# Patient Record
Sex: Male | Born: 1945 | Race: White | Hispanic: No | Marital: Married | Smoking: Never smoker
Health system: Southern US, Community
[De-identification: ages and names within clinical notes are randomized; demographics above are authoritative.]

## PROBLEM LIST (undated history)

## (undated) DIAGNOSIS — J189 Pneumonia, unspecified organism: Secondary | ICD-10-CM

## (undated) DIAGNOSIS — M1990 Unspecified osteoarthritis, unspecified site: Secondary | ICD-10-CM

## (undated) DIAGNOSIS — J84112 Idiopathic pulmonary fibrosis: Secondary | ICD-10-CM

## (undated) DIAGNOSIS — Z9289 Personal history of other medical treatment: Secondary | ICD-10-CM

## (undated) DIAGNOSIS — J42 Unspecified chronic bronchitis: Secondary | ICD-10-CM

## (undated) DIAGNOSIS — I2510 Atherosclerotic heart disease of native coronary artery without angina pectoris: Secondary | ICD-10-CM

## (undated) DIAGNOSIS — G4733 Obstructive sleep apnea (adult) (pediatric): Secondary | ICD-10-CM

## (undated) DIAGNOSIS — E7800 Pure hypercholesterolemia, unspecified: Secondary | ICD-10-CM

## (undated) DIAGNOSIS — E039 Hypothyroidism, unspecified: Secondary | ICD-10-CM

## (undated) DIAGNOSIS — Z9989 Dependence on other enabling machines and devices: Secondary | ICD-10-CM

## (undated) DIAGNOSIS — K254 Chronic or unspecified gastric ulcer with hemorrhage: Secondary | ICD-10-CM

## (undated) DIAGNOSIS — I4510 Unspecified right bundle-branch block: Secondary | ICD-10-CM

## (undated) DIAGNOSIS — I10 Essential (primary) hypertension: Secondary | ICD-10-CM

## (undated) HISTORY — PX: TONSILLECTOMY: SUR1361

---

## 1970-08-03 HISTORY — PX: INGUINAL HERNIA REPAIR: SUR1180

## 1982-08-03 DIAGNOSIS — K254 Chronic or unspecified gastric ulcer with hemorrhage: Secondary | ICD-10-CM

## 1982-08-03 DIAGNOSIS — Z9289 Personal history of other medical treatment: Secondary | ICD-10-CM

## 1982-08-03 HISTORY — DX: Chronic or unspecified gastric ulcer with hemorrhage: K25.4

## 1982-08-03 HISTORY — DX: Personal history of other medical treatment: Z92.89

## 1992-08-03 HISTORY — PX: CATARACT EXTRACTION W/ INTRAOCULAR LENS  IMPLANT, BILATERAL: SHX1307

## 1992-08-03 HISTORY — PX: CYSTOSCOPY W/ STONE MANIPULATION: SHX1427

## 1992-08-03 HISTORY — PX: KNEE ARTHROSCOPY: SHX127

## 2016-01-10 ENCOUNTER — Institutional Professional Consult (permissible substitution): Payer: Self-pay | Admitting: Internal Medicine

## 2016-01-20 ENCOUNTER — Encounter (HOSPITAL_COMMUNITY): Payer: Self-pay

## 2016-01-20 ENCOUNTER — Encounter (HOSPITAL_COMMUNITY)
Admission: RE | Admit: 2016-01-20 | Discharge: 2016-01-20 | Disposition: A | Discharge status: Home / Self Care | Payer: Medicare Other | Source: Ambulatory Visit | Attending: Pulmonary Disease | Admitting: Pulmonary Disease

## 2016-01-20 VITALS — BP 153/65 | HR 75 | Ht 71.0 in | Wt 251.1 lb

## 2016-01-20 DIAGNOSIS — J84112 Idiopathic pulmonary fibrosis: Secondary | ICD-10-CM | POA: Insufficient documentation

## 2016-01-20 HISTORY — DX: Idiopathic pulmonary fibrosis: J84.112

## 2016-01-20 HISTORY — DX: Unspecified right bundle-branch block: I45.10

## 2016-01-20 HISTORY — DX: Essential (primary) hypertension: I10

## 2016-01-20 NOTE — Progress Notes (Signed)
Douglas Lopez 70 y.o. male Pulmonary Rehab Orientation Note Patient arrived today in Cardiac and Pulmonary Rehab for orientation to Pulmonary Rehab. [facility] via wheel chair. He does not carry portable oxygen. Per pt, he never uses oxygen. Color good, skin warm and dry. Patient is oriented to time and place. Patient's medical history, psychosocial health, and medications reviewed. Psychosocial assessment reveals pt lives with their spouse. 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  Pt hobbies include geneology, golf, reading, and travel with his wife. Pt reports his stress level is low. He has no stress in his life.  Pt does not exhibit signs of depression. PHQ2/9 score 0/0. Pt shows good  coping skills with positive outlook . Will continue to monitor and evaluate any change in psychosocial issues, none were identified at this time.  Physical assessment reveals heart rate is normal, breath sounds clear to auscultation, no wheezes, rales, or rhonchi. Grip strength equal, strong. Distal pulses 2+ bilateral posterior tibial present . Patient reports he does take medications as prescribed. Patient states he follows a Regular diet. The patient reports no specific efforts to gain or lose weight.. Patient's weight will be monitored closely. Demonstration and practice of PLB using pulse oximeter. Patient able to return demonstration satisfactorily. Safety and hand hygiene in the exercise area reviewed with patient. Patient voices understanding of the information reviewed. Department expectations discussed with patient and achievable goals were set. The patient shows enthusiasm about attending the program and we look forward to working with this nice gentleman. The patient is scheduled for a 6 min walk test on Thursday, February 06, 2016 @ 4 pm and to begin exercise on Thursday, February 13, 2016 in the 1:30pm.   0930-1300

## 2016-01-23 ENCOUNTER — Encounter (HOSPITAL_COMMUNITY): Payer: Self-pay | Admitting: *Deleted

## 2016-02-06 ENCOUNTER — Encounter (HOSPITAL_COMMUNITY)
Admission: RE | Admit: 2016-02-06 | Discharge: 2016-02-06 | Disposition: A | Discharge status: Home / Self Care | Payer: Medicare Other | Source: Ambulatory Visit | Attending: Pulmonary Disease | Admitting: Pulmonary Disease

## 2016-02-06 DIAGNOSIS — J84112 Idiopathic pulmonary fibrosis: Secondary | ICD-10-CM | POA: Diagnosis not present

## 2016-02-07 NOTE — Progress Notes (Signed)
Pulmonary Individual Treatment Plan  Patient Details  Name: Douglas Lopez MRN: [id number] Date of Birth: Jan 25, 1946 Referring Provider:        [facility]ulmonary Rehab [facility]   Referring Provider  Dr. Molli KnockYacoub      Initial Encounter Date:       Pulmonary Rehab [facility]   Date  02/07/16   Referring Provider  Dr. Molli KnockYacoub      Visit Diagnosis: Idiopathic pulmonary fibrosis (HCC)  Patient's Home Medications on Admission:   Current outpatient prescriptions:  .  amLODipine (NORVASC) 2.5 MG tablet, Take 2.5 mg by mouth daily., Disp: , Rfl:  .  amlodipine-benazepril (LOTREL) 2.5-10 MG capsule, Take 1 capsule by mouth daily., Disp: , Rfl:  .  aspirin 81 MG tablet, Take 81 mg by mouth daily., Disp: , Rfl:  .  calcium carbonate (TUMS - DOSED IN MG ELEMENTAL CALCIUM) 500 MG chewable tablet, Chew 1 tablet by mouth daily., Disp: , Rfl:  .  cholecalciferol (VITAMIN D) 1000 units tablet, Take 1,000 Units by mouth daily., Disp: , Rfl:  .  levothyroxine (SYNTHROID, LEVOTHROID) 125 MCG tablet, Take 125 mcg by mouth daily before breakfast., Disp: , Rfl:  .  Multiple Vitamins-Minerals (MULTIVITAMIN WITH MINERALS) tablet, Take 1 tablet by mouth daily., Disp: , Rfl:  .  omeprazole (PRILOSEC) 20 MG capsule, Take 20 mg by mouth daily., Disp: , Rfl:  .  pravastatin (PRAVACHOL) 20 MG tablet, Take 20 mg by mouth daily., Disp: , Rfl:   Past Medical History: Past Medical History  Diagnosis Date  . Hypertension   . Idiopathic pulmonary fibrosis (HCC)   . Right bundle branch block     Tobacco Use: History  Smoking status  . Never Smoker   Smokeless tobacco  . Never Used    Labs: Recent Review Flowsheet Data    There is no flowsheet data to display.      Capillary Blood Glucose: No results found for: GLUCAP   ADL UCSD:     Pulmonary Assessment Scores      01/23/16 0814       ADL UCSD   ADL Phase Entry     SOB Score total 62        Pulmonary Function Assessment:     Pulmonary Function Assessment - 01/20/16 1043    Breath   Bilateral Breath Sounds Clear   Shortness of Breath Yes      Exercise Target Goals: Date: 02/07/16  Exercise Program Goal: Individual exercise prescription set with THRR, safety & activity barriers. Participant demonstrates ability to understand and report RPE using BORG scale, to self-measure pulse accurately, and to acknowledge the importance of the exercise prescription.  Exercise Prescription Goal: Starting with aerobic activity 30 plus minutes a day, 3 days per week for initial exercise prescription. Provide home exercise prescription and guidelines that participant acknowledges understanding prior to discharge.  Activity Barriers & Risk Stratification:     Activity Barriers & Cardiac Risk Stratification - 01/20/16 1041    Activity Barriers & Cardiac Risk Stratification   Activity Barriers Shortness of Breath;Deconditioning      6 Minute Walk:     6 Minute Walk      02/07/16 0640       6 Minute Walk   Phase Initial     Distance 1720 feet     Walk Time 6 minutes     #  of Rest Breaks 0     MPH 3.25     METS 3.45     RPE 12     Perceived Dyspnea  1     Symptoms No     Resting HR 93 bpm     Resting BP 132/80 mmHg     Max Ex. HR 121 bpm     Max Ex. BP 140/84 mmHg     2 Minute Post BP 144/70 mmHg     Interval Oxygen   Interval Oxygen? Yes     Baseline Oxygen Saturation % 97 %     Baseline Liters of Oxygen 0 L     1 Minute Oxygen Saturation % 95 %     1 Minute Liters of Oxygen 0 L     2 Minute Oxygen Saturation % 91 %     2 Minute Liters of Oxygen 0 L     3 Minute Oxygen Saturation % 91 %     3 Minute Liters of Oxygen 0 L     4 Minute Oxygen Saturation % 92 %     4 Minute Liters of Oxygen 0 L     5 Minute Oxygen Saturation % 89 %     5 Minute Liters of Oxygen 0 L     6 Minute Oxygen Saturation % 92 %      6 Minute Liters of Oxygen 0 L     2 Minute Post Oxygen Saturation % 97 %     2 Minute Post Liters of Oxygen 0 L        Initial Exercise Prescription:     Initial Exercise Prescription - 02/07/16 0600    Date of Initial Exercise RX and Referring Provider   Date 02/07/16   Referring Provider Dr. Molli Knock   Treadmill   MPH 1.7   Grade 0   Minutes 17   Bike   Level 0.5   Minutes 17   Rower   Level 1   Minutes 17   Prescription Details   Frequency (times per week) 2   Duration Progress to 45 minutes of aerobic exercise without signs/symptoms of physical distress   Intensity   THRR 40-80% of Max Heartrate 60-120   Ratings of Perceived Exertion 11-13   Perceived Dyspnea 0-4   Progression   Progression Continue progressive overload as per policy without signs/symptoms or physical distress.   Resistance Training   Training Prescription Yes   Weight blue band   Reps 10-12      Perform Capillary Blood Glucose checks as needed.  Exercise Prescription Changes:   Exercise Comments:   Discharge Exercise Prescription (Final Exercise Prescription Changes):    Nutrition:  Target Goals: Understanding of nutrition guidelines, daily intake of sodium 1500mg , cholesterol 200mg , calories 30% from fat and 7% or less from saturated fats, daily to have 5 or more servings of fruits and vegetables.  Biometrics:     Pre Biometrics - 01/20/16 1050    Pre Biometrics   Grip Strength 35 kg       Nutrition Therapy Plan and Nutrition Goals:   Nutrition Discharge: Rate Your Plate Scores:   Psychosocial: Target Goals: Acknowledge presence or absence of depression, maximize coping skills, provide positive support system. Participant is able to verbalize types and ability to use techniques and skills needed for reducing stress and depression.  Initial Review & Psychosocial Screening:     Initial Psych Review & Screening - 01/20/16 1103    Initial  Review   Current issues with  --  none identified   Family Dynamics   Good Support System? Yes   Concerns --  none identified   Barriers   Psychosocial barriers to participate in program There are no identifiable barriers or psychosocial needs.   Screening Interventions   Interventions Encouraged to exercise      Quality of Life Scores:     Quality of Life - 01/23/16 0813    Quality of Life Scores   Health/Function Pre 16.33 %   Socioeconomic Pre 20.63 %   Psych/Spiritual Pre 20.5 %   Family Pre 20.25 %   GLOBAL Pre 18.66 %      PHQ-9:     Recent Review Flowsheet Data    Depression screen [facility] 2/9 01/20/2016   Decreased Interest 0   Down, Depressed, Hopeless 0   PHQ - 2 Score 0      Psychosocial Evaluation and Intervention:     Psychosocial Evaluation - 01/20/16 1104    Psychosocial Evaluation & Interventions   Interventions --  none   Comments none required   Continued Psychosocial Services Needed No      Psychosocial Re-Evaluation:  Education: Education Goals: Education classes will be provided on a weekly basis, covering required topics. Participant will state understanding/return demonstration of topics presented.  Learning Barriers/Preferences:     Learning Barriers/Preferences - 01/20/16 1042    Learning Barriers/Preferences   Learning Barriers None   Learning Preferences Written Material;Video;Verbal Instruction;Skilled Demonstration;Pictoral;Individual Instruction;Group Instruction;Computer/Internet;Audio      Education Topics: Risk Factor Reduction:  -Group instruction that is supported by a PowerPoint presentation. Instructor discusses the definition of a risk factor, different risk factors for pulmonary disease, and how the heart and lungs work together.     Nutrition for Pulmonary Patient:  -Group instruction provided by PowerPoint slides, verbal discussion, and written materials to support subject matter. The instructor gives an explanation and review of healthy  diet recommendations, which includes a discussion on weight management, recommendations for fruit and vegetable consumption, as well as protein, fluid, caffeine, fiber, sodium, sugar, and alcohol. Tips for eating when patients are short of breath are discussed.   Pursed Lip Breathing:  -Group instruction that is supported by demonstration and informational handouts. Instructor discusses the benefits of pursed lip and diaphragmatic breathing and detailed demonstration on how to preform both.     Oxygen Safety:  -Group instruction provided by PowerPoint, verbal discussion, and written material to support subject matter. There is an overview of "What is Oxygen" and "Why do we need it".  Instructor also reviews how to create a safe environment for oxygen use, the importance of using oxygen as prescribed, and the risks of noncompliance. There is a brief discussion on traveling with oxygen and resources the patient may utilize.   Oxygen Equipment:  -Group instruction provided by [facility] Staff utilizing handouts, written materials, and equipment demonstrations.   Signs and Symptoms:  -Group instruction provided by written material and verbal discussion to support subject matter. Warning signs and symptoms of infection, stroke, and heart attack are reviewed and when to call the physician/911 reinforced. Tips for preventing the spread of infection discussed.   Advanced Directives:  -Group instruction provided by verbal instruction and written material to support subject matter. Instructor reviews Advanced Directive laws and proper instruction for filling out document.   Pulmonary Video:  -Group video education that reviews the importance of medication and oxygen compliance, exercise, good nutrition, pulmonary hygiene, and pursed  lip and diaphragmatic breathing for the pulmonary patient.   Exercise for the Pulmonary Patient:  -Group instruction that is supported by a PowerPoint presentation.  Instructor discusses benefits of exercise, core components of exercise, frequency, duration, and intensity of an exercise routine, importance of utilizing pulse oximetry during exercise, safety while exercising, and options of places to exercise outside of rehab.     Pulmonary Medications:  -Verbally interactive group education provided by instructor with focus on inhaled medications and proper administration.   Anatomy and Physiology of the Respiratory System and Intimacy:  -Group instruction provided by PowerPoint, verbal discussion, and written material to support subject matter. Instructor reviews respiratory cycle and anatomical components of the respiratory system and their functions. Instructor also reviews differences in obstructive and restrictive respiratory diseases with examples of each. Intimacy, Sex, and Sexuality differences are reviewed with a discussion on how relationships can change when diagnosed with pulmonary disease. Common sexual concerns are reviewed.   Knowledge Questionnaire Score:     Knowledge Questionnaire Score - 01/23/16 0813    Knowledge Questionnaire Score   Pre Score 8/13      Core Components/Risk Factors/Patient Goals at Admission:     Personal Goals and Risk Factors at Admission - 01/20/16 1053    Core Components/Risk Factors/Patient Goals on Admission    Weight Management Obesity   Increase Strength and Stamina Yes   Intervention Provide advice, education, support and counseling about physical activity/exercise needs.;Develop an individualized exercise prescription for aerobic and resistive training based on initial evaluation findings, risk stratification, comorbidities and participant's personal goals.   Expected Outcomes Achievement of increased cardiorespiratory fitness and enhanced flexibility, muscular endurance and strength shown through measurements of functional capacity and personal statement of participant.   Improve shortness of breath  with ADL's Yes   Intervention Provide education, individualized exercise plan and daily activity instruction to help decrease symptoms of SOB with activities of daily living.   Expected Outcomes Short Term: Achieves a reduction of symptoms when performing activities of daily living.      Core Components/Risk Factors/Patient Goals Review:      Goals and Risk Factor Review      01/20/16 1101           Core Components/Risk Factors/Patient Goals Review   Personal Goals Review Weight Management/Obesity;Increase Strength and Stamina;Improve shortness of breath with ADL's       Review To increase strength and stamina to improve shortness of breath, and lose weight to improve breathing       Expected Outcomes Improved strength and stamina with also weight loss.          Core Components/Risk Factors/Patient Goals at Discharge (Final Review):      Goals and Risk Factor Review - 01/20/16 1101    Core Components/Risk Factors/Patient Goals Review   Personal Goals Review Weight Management/Obesity;Increase Strength and Stamina;Improve shortness of breath with ADL's   Review To increase strength and stamina to improve shortness of breath, and lose weight to improve breathing   Expected Outcomes Improved strength and stamina with also weight loss.      ITP Comments:   Comments:

## 2016-02-13 ENCOUNTER — Encounter (HOSPITAL_COMMUNITY)
Admission: RE | Admit: 2016-02-13 | Discharge: 2016-02-13 | Disposition: A | Discharge status: Home / Self Care | Payer: Medicare Other | Source: Ambulatory Visit | Attending: Pulmonary Disease | Admitting: Pulmonary Disease

## 2016-02-13 VITALS — Wt 248.9 lb

## 2016-02-13 DIAGNOSIS — J84112 Idiopathic pulmonary fibrosis: Secondary | ICD-10-CM | POA: Diagnosis not present

## 2016-02-13 NOTE — Progress Notes (Signed)
Daily Session Note  Patient Details  Name: Douglas Lopez MRN: [id number] Date of Birth: 1946-04-15 Referring Provider:        Pulmonary Rehab Walk Test from 02/06/2016 in [facility]   Referring Provider  Dr. Nelda Marseille      Encounter Date: 02/13/2016  Check In:     Session Check In - 02/13/16 1612    Check-In   Staff Present Rosebud Poles, RN, BSN;Jasan Doughtie Ysidro Evert, RN;Portia Rollene Rotunda, RN, BSN   Supervising physician immediately available to respond to emergencies [facility] immediately available   Physician(s) Dr. Marthenia Rolling   Medication changes reported     No   Fall or balance concerns reported    No   Warm-up and Cool-down Performed as group-led instruction   Resistance Training Performed Yes   VAD Patient? No   Pain Assessment   Currently in Pain? No/denies   Multiple Pain Sites No      Capillary Blood Glucose: No results found for this or any previous visit (from the past 24 hour(s)).      Exercise Prescription Changes - 02/13/16 1600    Exercise Review   Progression Yes   Response to Exercise   Blood Pressure (Admit) 142/72 mmHg   Blood Pressure (Exercise) 160/84 mmHg   Blood Pressure (Exit) 112/72 mmHg   Heart Rate (Admit) 81 bpm   Heart Rate (Exercise) 99 bpm   Heart Rate (Exit) 67 bpm   Oxygen Saturation (Admit) 96 %   Oxygen Saturation (Exercise) 93 %   Oxygen Saturation (Exit) 95 %   Rating of Perceived Exertion (Exercise) 9   Perceived Dyspnea (Exercise) 1   Duration Progress to 45 minutes of aerobic exercise without signs/symptoms of physical distress   Intensity THRR unchanged   Progression   Progression Continue to progress workloads to maintain intensity without signs/symptoms of physical distress.   Resistance Training   Training Prescription Yes   Weight blue bands   Reps 10-12   Interval Training   Interval Training No   Treadmill   MPH 1.7   Grade 0   Minutes 17   Rower   Level 2   Minutes 17     Goals Met:   Exercise tolerated well No report of cardiac concerns or symptoms Strength training completed today  Goals Unmet:  Not Applicable  Comments: Service time is from 1330 to 1530    Dr. Rush Farmer is Medical Director for Pulmonary Rehab at [facility].

## 2016-02-13 NOTE — Progress Notes (Signed)
Pulmonary Individual Treatment Plan  Patient Details  Name: Douglas Lopez MRN: [id number] Date of Birth: Jul 06, 1946 Referring Provider:        Pulmonary Rehab Walk Test from 02/06/2016 in [facility]   Referring Provider  Dr. Molli Knock      Initial Encounter Date:       Pulmonary Rehab Walk Test from 02/06/2016 in [facility] CARDIAC REHAB   Date  02/07/16   Referring Provider  Dr. Molli Knock      Visit Diagnosis: Idiopathic pulmonary fibrosis (HCC)  Patient's Home Medications on Admission:   Current outpatient prescriptions:  .  amLODipine (NORVASC) 2.5 MG tablet, Take 2.5 mg by mouth daily., Disp: , Rfl:  .  amlodipine-benazepril (LOTREL) 2.5-10 MG capsule, Take 1 capsule by mouth daily., Disp: , Rfl:  .  aspirin 81 MG tablet, Take 81 mg by mouth daily., Disp: , Rfl:  .  calcium carbonate (TUMS - DOSED IN MG ELEMENTAL CALCIUM) 500 MG chewable tablet, Chew 1 tablet by mouth daily., Disp: , Rfl:  .  cholecalciferol (VITAMIN D) 1000 units tablet, Take 1,000 Units by mouth daily., Disp: , Rfl:  .  levothyroxine (SYNTHROID, LEVOTHROID) 125 MCG tablet, Take 125 mcg by mouth daily before breakfast., Disp: , Rfl:  .  Multiple Vitamins-Minerals (MULTIVITAMIN WITH MINERALS) tablet, Take 1 tablet by mouth daily., Disp: , Rfl:  .  omeprazole (PRILOSEC) 20 MG capsule, Take 20 mg by mouth daily., Disp: , Rfl:  .  pravastatin (PRAVACHOL) 20 MG tablet, Take 20 mg by mouth daily., Disp: , Rfl:   Past Medical History: Past Medical History  Diagnosis Date  . Hypertension   . Idiopathic pulmonary fibrosis (HCC)   . Right bundle branch block     Tobacco Use: History  Smoking status  . Never Smoker   Smokeless tobacco  . Never Used    Labs: Recent Review Flowsheet Data    There is no flowsheet data to display.      Capillary Blood Glucose: No results found for: GLUCAP   ADL UCSD:     Pulmonary Assessment Scores      01/23/16 0814       ADL UCSD   ADL Phase Entry     SOB Score total 62        Pulmonary Function Assessment:     Pulmonary Function Assessment - 01/20/16 1043    Breath   Bilateral Breath Sounds Clear   Shortness of Breath Yes      Exercise Target Goals:    Exercise Program Goal: Individual exercise prescription set with THRR, safety & activity barriers. Participant demonstrates ability to understand and report RPE using BORG scale, to self-measure pulse accurately, and to acknowledge the importance of the exercise prescription.  Exercise Prescription Goal: Starting with aerobic activity 30 plus minutes a day, 3 days per week for initial exercise prescription. Provide home exercise prescription and guidelines that participant acknowledges understanding prior to discharge.  Activity Barriers & Risk Stratification:     Activity Barriers & Cardiac Risk Stratification - 01/20/16 1041    Activity Barriers & Cardiac Risk Stratification   Activity Barriers Shortness of Breath;Deconditioning      6 Minute Walk:     6 Minute Walk      02/07/16 0640       6 Minute Walk   Phase Initial     Distance 1720 feet     Walk Time 6 minutes     #  of Rest Breaks 0     MPH 3.25     METS 3.45     RPE 12     Perceived Dyspnea  1     Symptoms No     Resting HR 93 bpm     Resting BP 132/80 mmHg     Max Ex. HR 121 bpm     Max Ex. BP 140/84 mmHg     2 Minute Post BP 144/70 mmHg     Interval Oxygen   Interval Oxygen? Yes     Baseline Oxygen Saturation % 97 %     Baseline Liters of Oxygen 0 L     1 Minute Oxygen Saturation % 95 %     1 Minute Liters of Oxygen 0 L     2 Minute Oxygen Saturation % 91 %     2 Minute Liters of Oxygen 0 L     3 Minute Oxygen Saturation % 91 %     3 Minute Liters of Oxygen 0 L     4 Minute Oxygen Saturation % 92 %     4 Minute Liters of Oxygen 0 L     5 Minute Oxygen Saturation % 89 %     5 Minute Liters of Oxygen 0 L     6 Minute Oxygen Saturation % 92 %     6 Minute  Liters of Oxygen 0 L     2 Minute Post Oxygen Saturation % 97 %     2 Minute Post Liters of Oxygen 0 L        Initial Exercise Prescription:     Initial Exercise Prescription - 02/07/16 0600    Date of Initial Exercise RX and Referring Provider   Date 02/07/16   Referring Provider Dr. Molli Knock   Treadmill   MPH 1.7   Grade 0   Minutes 17   Bike   Level 0.5   Minutes 17   Rower   Level 1   Minutes 17   Prescription Details   Frequency (times per week) 2   Duration Progress to 45 minutes of aerobic exercise without signs/symptoms of physical distress   Intensity   THRR 40-80% of Max Heartrate 60-120   Ratings of Perceived Exertion 11-13   Perceived Dyspnea 0-4   Progression   Progression Continue progressive overload as per policy without signs/symptoms or physical distress.   Resistance Training   Training Prescription Yes   Weight blue band   Reps 10-12      Perform Capillary Blood Glucose checks as needed.  Exercise Prescription Changes:     Exercise Prescription Changes      02/13/16 1600           Exercise Review   Progression Yes       Response to Exercise   Blood Pressure (Admit) 142/72 mmHg       Blood Pressure (Exercise) 160/84 mmHg       Blood Pressure (Exit) 112/72 mmHg       Heart Rate (Admit) 81 bpm       Heart Rate (Exercise) 99 bpm       Heart Rate (Exit) 67 bpm       Oxygen Saturation (Admit) 96 %       Oxygen Saturation (Exercise) 93 %       Oxygen Saturation (Exit) 95 %       Rating of Perceived Exertion (Exercise) 9       Perceived Dyspnea (  Exercise) 1       Duration Progress to 45 minutes of aerobic exercise without signs/symptoms of physical distress       Intensity THRR unchanged       Progression   Progression Continue to progress workloads to maintain intensity without signs/symptoms of physical distress.       Resistance Training   Training Prescription Yes       Weight blue bands       Reps 10-12       Interval Training    Interval Training No       Treadmill   MPH 1.7       Grade 0       Minutes 17       Rower   Level 2       Minutes 17          Exercise Comments:     Exercise Comments      02/13/16 1627           Exercise Comments Today is first day of exercise, tolerated well.          Discharge Exercise Prescription (Final Exercise Prescription Changes):     Exercise Prescription Changes - 02/13/16 1600    Exercise Review   Progression Yes   Response to Exercise   Blood Pressure (Admit) 142/72 mmHg   Blood Pressure (Exercise) 160/84 mmHg   Blood Pressure (Exit) 112/72 mmHg   Heart Rate (Admit) 81 bpm   Heart Rate (Exercise) 99 bpm   Heart Rate (Exit) 67 bpm   Oxygen Saturation (Admit) 96 %   Oxygen Saturation (Exercise) 93 %   Oxygen Saturation (Exit) 95 %   Rating of Perceived Exertion (Exercise) 9   Perceived Dyspnea (Exercise) 1   Duration Progress to 45 minutes of aerobic exercise without signs/symptoms of physical distress   Intensity THRR unchanged   Progression   Progression Continue to progress workloads to maintain intensity without signs/symptoms of physical distress.   Resistance Training   Training Prescription Yes   Weight blue bands   Reps 10-12   Interval Training   Interval Training No   Treadmill   MPH 1.7   Grade 0   Minutes 17   Rower   Level 2   Minutes 17       Nutrition:  Target Goals: Understanding of nutrition guidelines, daily intake of sodium 1500mg , cholesterol 200mg , calories 30% from fat and 7% or less from saturated fats, daily to have 5 or more servings of fruits and vegetables.  Biometrics:     Pre Biometrics - 01/20/16 1050    Pre Biometrics   Grip Strength 35 kg       Nutrition Therapy Plan and Nutrition Goals:   Nutrition Discharge: Rate Your Plate Scores:   Psychosocial: Target Goals: Acknowledge presence or absence of depression, maximize coping skills, provide positive support system. Participant is able to  verbalize types and ability to use techniques and skills needed for reducing stress and depression.  Initial Review & Psychosocial Screening:     Initial Psych Review & Screening - 01/20/16 1103    Initial Review   Current issues with --  none identified   Family Dynamics   Good Support System? Yes   Concerns --  none identified   Barriers   Psychosocial barriers to participate in program There are no identifiable barriers or psychosocial needs.   Screening Interventions   Interventions Encouraged to exercise  Quality of Life Scores:     Quality of Life - 01/23/16 0813    Quality of Life Scores   Health/Function Pre 16.33 %   Socioeconomic Pre 20.63 %   Psych/Spiritual Pre 20.5 %   Family Pre 20.25 %   GLOBAL Pre 18.66 %      PHQ-9:     Recent Review Flowsheet Data    Depression screen [facility] 2/9 01/20/2016   Decreased Interest 0   Down, Depressed, Hopeless 0   PHQ - 2 Score 0      Psychosocial Evaluation and Intervention:     Psychosocial Evaluation - 01/20/16 1104    Psychosocial Evaluation & Interventions   Interventions --  none   Comments none required   Continued Psychosocial Services Needed No      Psychosocial Re-Evaluation:  Education: Education Goals: Education classes will be provided on a weekly basis, covering required topics. Participant will state understanding/return demonstration of topics presented.  Learning Barriers/Preferences:     Learning Barriers/Preferences - 01/20/16 1042    Learning Barriers/Preferences   Learning Barriers None   Learning Preferences Written Material;Video;Verbal Instruction;Skilled Demonstration;Pictoral;Individual Instruction;Group Instruction;Computer/Internet;Audio      Education Topics: Risk Factor Reduction:  -Group instruction that is supported by a PowerPoint presentation. Instructor discusses the definition of a risk factor, different risk factors for pulmonary disease, and how the heart and  lungs work together.     Nutrition for Pulmonary Patient:  -Group instruction provided by PowerPoint slides, verbal discussion, and written materials to support subject matter. The instructor gives an explanation and review of healthy diet recommendations, which includes a discussion on weight management, recommendations for fruit and vegetable consumption, as well as protein, fluid, caffeine, fiber, sodium, sugar, and alcohol. Tips for eating when patients are short of breath are discussed.   Pursed Lip Breathing:  -Group instruction that is supported by demonstration and informational handouts. Instructor discusses the benefits of pursed lip and diaphragmatic breathing and detailed demonstration on how to preform both.     Oxygen Safety:  -Group instruction provided by PowerPoint, verbal discussion, and written material to support subject matter. There is an overview of "What is Oxygen" and "Why do we need it".  Instructor also reviews how to create a safe environment for oxygen use, the importance of using oxygen as prescribed, and the risks of noncompliance. There is a brief discussion on traveling with oxygen and resources the patient may utilize.   Oxygen Equipment:  -Group instruction provided by [facility] Staff utilizing handouts, written materials, and equipment demonstrations.   Signs and Symptoms:  -Group instruction provided by written material and verbal discussion to support subject matter. Warning signs and symptoms of infection, stroke, and heart attack are reviewed and when to call the physician/911 reinforced. Tips for preventing the spread of infection discussed.          PULMONARY REHAB OTHER RESPIRATORY from 02/13/2016 in [facility] CARDIAC REHAB   Date  02/13/16   Educator  RN   Instruction Review Code  2- meets goals/outcomes      Advanced Directives:  -Group instruction provided by verbal instruction and written material to support subject  matter. Instructor reviews Advanced Directive laws and proper instruction for filling out document.   Pulmonary Video:  -Group video education that reviews the importance of medication and oxygen compliance, exercise, good nutrition, pulmonary hygiene, and pursed lip and diaphragmatic breathing for the pulmonary patient.   Exercise for the Pulmonary Patient:  -Group  instruction that is supported by a PowerPoint presentation. Instructor discusses benefits of exercise, core components of exercise, frequency, duration, and intensity of an exercise routine, importance of utilizing pulse oximetry during exercise, safety while exercising, and options of places to exercise outside of rehab.     Pulmonary Medications:  -Verbally interactive group education provided by instructor with focus on inhaled medications and proper administration.   Anatomy and Physiology of the Respiratory System and Intimacy:  -Group instruction provided by PowerPoint, verbal discussion, and written material to support subject matter. Instructor reviews respiratory cycle and anatomical components of the respiratory system and their functions. Instructor also reviews differences in obstructive and restrictive respiratory diseases with examples of each. Intimacy, Sex, and Sexuality differences are reviewed with a discussion on how relationships can change when diagnosed with pulmonary disease. Common sexual concerns are reviewed.   Knowledge Questionnaire Score:     Knowledge Questionnaire Score - 01/23/16 0813    Knowledge Questionnaire Score   Pre Score 8/13      Core Components/Risk Factors/Patient Goals at Admission:     Personal Goals and Risk Factors at Admission - 01/20/16 1053    Core Components/Risk Factors/Patient Goals on Admission    Weight Management Obesity   Increase Strength and Stamina Yes   Intervention Provide advice, education, support and counseling about physical activity/exercise  needs.;Develop an individualized exercise prescription for aerobic and resistive training based on initial evaluation findings, risk stratification, comorbidities and participant's personal goals.   Expected Outcomes Achievement of increased cardiorespiratory fitness and enhanced flexibility, muscular endurance and strength shown through measurements of functional capacity and personal statement of participant.   Improve shortness of breath with ADL's Yes   Intervention Provide education, individualized exercise plan and daily activity instruction to help decrease symptoms of SOB with activities of daily living.   Expected Outcomes Short Term: Achieves a reduction of symptoms when performing activities of daily living.      Core Components/Risk Factors/Patient Goals Review:      Goals and Risk Factor Review      01/20/16 1101 02/13/16 1629         Core Components/Risk Factors/Patient Goals Review   Personal Goals Review Weight Management/Obesity;Increase Strength and Stamina;Improve shortness of breath with ADL's Weight Management/Obesity;Increase Strength and Stamina;Improve shortness of breath with ADL's      Review To increase strength and stamina to improve shortness of breath, and lose weight to improve breathing First day of exercise, tolerated well.      Expected Outcomes Improved strength and stamina with also weight loss. Should see progress in next 30 days, will progress as tolerated.         Core Components/Risk Factors/Patient Goals at Discharge (Final Review):      Goals and Risk Factor Review - 02/13/16 1629    Core Components/Risk Factors/Patient Goals Review   Personal Goals Review Weight Management/Obesity;Increase Strength and Stamina;Improve shortness of breath with ADL's   Review First day of exercise, tolerated well.   Expected Outcomes Should see progress in next 30 days, will progress as tolerated.      ITP Comments:   Comments: ITP REVIEW Pt is making  expected progress toward personal goals after completing 1 session.   Recommend continued exercise, life style modification, education, and utilization of breathing techniques to increase stamina and strength and decrease shortness of breath with exertion.

## 2016-02-18 ENCOUNTER — Encounter (HOSPITAL_COMMUNITY)
Admission: RE | Admit: 2016-02-18 | Discharge: 2016-02-18 | Disposition: A | Discharge status: Home / Self Care | Payer: Medicare Other | Source: Ambulatory Visit | Attending: Pulmonary Disease | Admitting: Pulmonary Disease

## 2016-02-18 VITALS — Wt 244.9 lb

## 2016-02-18 DIAGNOSIS — J84112 Idiopathic pulmonary fibrosis: Secondary | ICD-10-CM

## 2016-02-18 NOTE — Progress Notes (Signed)
Daily Session Note  Patient Details  Name: Douglas Lopez MRN: [id number] Date of Birth: Dec 31, 1945 Referring Provider:        Pulmonary Rehab Walk Test from 02/06/2016 in [place]   Referring Provider  Dr. Nelda Marseille      Encounter Date: 02/18/2016  Check In:     Session Check In - 02/18/16 1537    Check-In   Location [facility]   Staff Present Su Hilt, MS, ACSM RCEP, Exercise Physiologist;Portia Rollene Rotunda, RN, BSN;Anisten Tomassi, RN;Joann Rion, Therapist, sports, BSN;Ramon Dredge, RN, [facility]   Supervising physician immediately available to respond to emergencies [facility] immediately available   Physician(s) Dr. Marily Memos   Medication changes reported     No   Fall or balance concerns reported    No   Warm-up and Cool-down Performed as group-led instruction   Resistance Training Performed Yes   VAD Patient? No   Pain Assessment   Currently in Pain? No/denies   Multiple Pain Sites No      Capillary Blood Glucose: No results found for this or any previous visit (from the past 24 hour(s)).      Exercise Prescription Changes - 02/18/16 1500    Exercise Review   Progression Yes   Response to Exercise   Blood Pressure (Admit) 134/60 mmHg   Blood Pressure (Exercise) 158/82 mmHg   Blood Pressure (Exit) 110/60 mmHg   Heart Rate (Admit) 69 bpm   Heart Rate (Exercise) 126 bpm   Heart Rate (Exit) 95 bpm   Oxygen Saturation (Admit) 94 %   Oxygen Saturation (Exercise) 89 %   Oxygen Saturation (Exit) 94 %   Rating of Perceived Exertion (Exercise) 11   Perceived Dyspnea (Exercise) 2   Duration Progress to 45 minutes of aerobic exercise without signs/symptoms of physical distress   Intensity THRR unchanged   Progression   Progression Continue to progress workloads to maintain intensity without signs/symptoms of physical distress.   Resistance Training   Training Prescription Yes   Weight blue bands   Reps 10-12  10 minutes of  strength training   Interval Training   Interval Training No   Treadmill   MPH 2.5   Grade 1   Minutes 17   Bike   Level 1.3   Minutes 17   Rower   Level 2   Minutes 17     Goals Met:  Exercise tolerated well No report of cardiac concerns or symptoms Strength training completed today  Goals Unmet:  Not Applicable  Comments: Service time is from 1330 to 1500    Dr. Rush Farmer is Medical Director for Pulmonary Rehab at [facility].

## 2016-02-20 ENCOUNTER — Encounter (HOSPITAL_COMMUNITY)
Admission: RE | Admit: 2016-02-20 | Discharge: 2016-02-20 | Disposition: A | Discharge status: Home / Self Care | Payer: Medicare Other | Source: Ambulatory Visit | Attending: Pulmonary Disease | Admitting: Pulmonary Disease

## 2016-02-20 VITALS — Wt 246.9 lb

## 2016-02-20 DIAGNOSIS — J84112 Idiopathic pulmonary fibrosis: Secondary | ICD-10-CM | POA: Diagnosis not present

## 2016-02-20 NOTE — Progress Notes (Signed)
Daily Session Note  Patient Details  Name: [name] MRN: [id number] Date of Birth: 04-23-1946 Referring Provider:        Pulmonary Rehab Walk Test from 02/06/2016 in [place]   Referring Provider  Dr. Nelda Marseille      Encounter Date: 02/20/2016  Check In:     Session Check In - 02/20/16 1330    Check-In   Location [facility]   Staff Present Rosebud Poles, RN, BSN;Shelvy Heckert Ysidro Evert, RN;Portia Rollene Rotunda, RN, BSN;Ramon Dredge, RN, MHA;Molly diVincenzo, MS, ACSM RCEP, Exercise Physiologist   Supervising physician immediately available to respond to emergencies [facility] immediately available   Physician(s) Dr. Cruzita Lederer   Medication changes reported     No   Fall or balance concerns reported    No   Warm-up and Cool-down Performed as group-led instruction   Resistance Training Performed Yes   VAD Patient? No   Pain Assessment   Currently in Pain? No/denies   Multiple Pain Sites No      Capillary Blood Glucose: No results found for this or any previous visit (from the past 24 hour(s)).      Exercise Prescription Changes - 02/20/16 1600    Exercise Review   Progression Yes   Response to Exercise   Blood Pressure (Admit) 124/68 mmHg   Blood Pressure (Exercise) 154/84 mmHg   Blood Pressure (Exit) 122/70 mmHg   Heart Rate (Admit) 76 bpm   Heart Rate (Exercise) 102 bpm   Heart Rate (Exit) 86 bpm   Oxygen Saturation (Admit) 98 %   Oxygen Saturation (Exercise) 90 %   Oxygen Saturation (Exit) 96 %   Rating of Perceived Exertion (Exercise) 11   Perceived Dyspnea (Exercise) 1   Duration Progress to 45 minutes of aerobic exercise without signs/symptoms of physical distress   Intensity THRR unchanged   Progression   Progression Continue to progress workloads to maintain intensity without signs/symptoms of physical distress.   Resistance Training   Training Prescription Yes   Weight blue bands   Reps 10-12  10 minutes of  strength training   Interval Training   Interval Training No   Bike   Level 1.5   Minutes 17   Rower   Level 4   Minutes 17     Goals Met:  Exercise tolerated well No report of cardiac concerns or symptoms Strength training completed today  Goals Unmet:  Not Applicable  Comments: Service time is from 1330 to 1540    Dr. Rush Farmer is Medical Director for Pulmonary Rehab at [facility].

## 2016-02-25 ENCOUNTER — Encounter (HOSPITAL_COMMUNITY)
Admission: RE | Admit: 2016-02-25 | Discharge: 2016-02-25 | Disposition: A | Discharge status: Home / Self Care | Payer: Medicare Other | Source: Ambulatory Visit | Attending: Pulmonary Disease | Admitting: Pulmonary Disease

## 2016-02-25 VITALS — Wt 244.7 lb

## 2016-02-25 DIAGNOSIS — J84112 Idiopathic pulmonary fibrosis: Secondary | ICD-10-CM | POA: Diagnosis not present

## 2016-02-25 NOTE — Progress Notes (Signed)
Daily Session Note  Patient Details  Name: Douglas Lopez MRN: [id number] Date of Birth: August 17, 1945 Referring Provider:   April Manson Pulmonary Rehab Walk Test from 02/06/2016 in [place]  Referring Provider  Dr. Nelda Marseille      Encounter Date: 02/25/2016  Check In:   Capillary Blood Glucose: No results found for this or any previous visit (from the past 24 hour(s)).      Exercise Prescription Changes - 02/25/16 1500      Response to Exercise   Blood Pressure (Admit) 126/74   Blood Pressure (Exercise) 150/70   Blood Pressure (Exit) 116/70   Heart Rate (Admit) 70 bpm   Heart Rate (Exercise) 110 bpm   Heart Rate (Exit) 85 bpm   Oxygen Saturation (Admit) 96 %   Oxygen Saturation (Exercise) 91 %   Oxygen Saturation (Exit) 96 %   Rating of Perceived Exertion (Exercise) 12   Perceived Dyspnea (Exercise) 1   Duration Progress to 45 minutes of aerobic exercise without signs/symptoms of physical distress   Intensity THRR unchanged     Progression   Progression Continue to progress workloads to maintain intensity without signs/symptoms of physical distress.     Resistance Training   Training Prescription Yes   Weight blue bands   Reps 10-12  10 minutes of strength training     Interval Training   Interval Training No     Treadmill   MPH 2.5   Grade 1   Minutes 17     Bike   Level 1.5   Minutes 17     Rower   Level 4   Minutes 17     Goals Met:  Exercise tolerated well Strength training completed today  Goals Unmet:  Not Applicable  Comments: Service time is from 1330 to 1510    Dr. Rush Farmer is Medical Director for Pulmonary Rehab at [facility].

## 2016-02-27 ENCOUNTER — Encounter (HOSPITAL_COMMUNITY)
Admission: RE | Admit: 2016-02-27 | Discharge: 2016-02-27 | Disposition: A | Discharge status: Home / Self Care | Payer: Medicare Other | Source: Ambulatory Visit | Attending: Pulmonary Disease | Admitting: Pulmonary Disease

## 2016-02-27 VITALS — Wt 245.6 lb

## 2016-02-27 DIAGNOSIS — J84112 Idiopathic pulmonary fibrosis: Secondary | ICD-10-CM | POA: Diagnosis not present

## 2016-02-27 NOTE — Progress Notes (Signed)
Daily Session Note  Patient Details  Name: Douglas Lopez MRN: [id number] Date of Birth: 1946-05-18 Referring Provider:   April Manson Pulmonary Rehab Walk Test from 02/06/2016 in [place]  Referring Provider  Dr. Nelda Marseille      Encounter Date: 02/27/2016  Check In:     Session Check In - 02/27/16 1351      Check-In   Location [facility]   Staff Present Rosebud Poles, RN, BSN;Molly diVincenzo, MS, ACSM RCEP, Exercise Physiologist;Annedrea Rosezella [place], RN, MHA;Portia Rollene Rotunda, RN, BSN   Supervising physician immediately available to respond to emergencies [facility] immediately available   Physician(s) Dr. Waldron Labs   Medication changes reported     No   Fall or balance concerns reported    No   Warm-up and Cool-down Performed as group-led instruction   Resistance Training Performed Yes   VAD Patient? No     Pain Assessment   Currently in Pain? No/denies   Multiple Pain Sites No      Capillary Blood Glucose: No results found for this or any previous visit (from the past 24 hour(s)).      Exercise Prescription Changes - 02/27/16 1500      Exercise Review   Progression Yes     Response to Exercise   Blood Pressure (Admit) 134/70   Blood Pressure (Exercise) 150/80   Blood Pressure (Exit) 110/78   Heart Rate (Admit) 82 bpm   Heart Rate (Exercise) 113 bpm   Heart Rate (Exit) 87 bpm   Oxygen Saturation (Admit) 95 %   Oxygen Saturation (Exercise) 89 %   Oxygen Saturation (Exit) 95 %   Rating of Perceived Exertion (Exercise) 11   Perceived Dyspnea (Exercise) 0   Duration Progress to 45 minutes of aerobic exercise without signs/symptoms of physical distress   Intensity THRR unchanged     Progression   Progression Continue to progress workloads to maintain intensity without signs/symptoms of physical distress.     Resistance Training   Training Prescription Yes   Weight blue bands   Reps 10-12  10 minutes of  strength training     Interval Training   Interval Training No     Treadmill   MPH 2.5   Grade 2   Minutes 17     Bike   Level 1.5   Minutes 17     Goals Met:  Exercise tolerated well Strength training completed today  Goals Unmet:  Not Applicable  Comments: Service time is from 1330 to 1505    Dr. Rush Farmer is Medical Director for Pulmonary Rehab at [facility].

## 2016-03-03 ENCOUNTER — Encounter (HOSPITAL_COMMUNITY)
Admission: RE | Admit: 2016-03-03 | Discharge: 2016-03-03 | Disposition: A | Discharge status: Home / Self Care | Payer: Medicare Other | Source: Ambulatory Visit | Attending: Pulmonary Disease | Admitting: Pulmonary Disease

## 2016-03-03 VITALS — Wt 245.2 lb

## 2016-03-03 DIAGNOSIS — J84112 Idiopathic pulmonary fibrosis: Secondary | ICD-10-CM | POA: Insufficient documentation

## 2016-03-03 NOTE — Progress Notes (Signed)
I have reviewed a Home Exercise Prescription with Douglas Lopez . Douglas Lopez is not currently exercising at home.  The patient was advised to walk 3 days a week for 45 minutes.  Douglas Lopez and I discussed how to progress their exercise prescription.  The patient stated that their goals were to stay off of O2 and lose weight.  The patient stated that they understand the exercise prescription.  We reviewed exercise guidelines, target heart rate during exercise, oxygen use, weather, home pulse oximeter, endpoints for exercise, and goals.  Patient is encouraged to come to me with any questions. I will continue to follow up with the patient to assist them with progression and safety.

## 2016-03-03 NOTE — Progress Notes (Signed)
Daily Session Note  Patient Details  Name: Desmund Elman MRN: [id number] Date of Birth: [birth year] Referring Provider:   April Manson Pulmonary Rehab Walk Test from 02/06/2016 in [place]  Referring Provider  Dr. Nelda Marseille      Encounter Date: 03/03/2016  Check In:     Session Check In - 03/03/16 1341      Check-In   Location [facility]   Staff Present Su Hilt, MS, ACSM RCEP, Exercise Physiologist;Ludy Messamore Leonia Reeves, RN, BSN;Lisa Ysidro Evert, RN;Portia Rollene Rotunda, RN, BSN   Supervising physician immediately available to respond to emergencies [facility] immediately available   Physician(s) Dr. Marily Memos   Medication changes reported     No   Fall or balance concerns reported    No   Warm-up and Cool-down Performed as group-led instruction   Resistance Training Performed Yes   VAD Patient? No     Pain Assessment   Currently in Pain? No/denies   Multiple Pain Sites No      Capillary Blood Glucose: No results found for this or any previous visit (from the past 24 hour(s)).      Exercise Prescription Changes - 03/03/16 1600      Exercise Review   Progression Yes     Response to Exercise   Blood Pressure (Admit) 120/64   Blood Pressure (Exercise) 152/82   Blood Pressure (Exit) 120/60   Heart Rate (Admit) 83 bpm   Heart Rate (Exercise) 116 bpm   Heart Rate (Exit) 86 bpm   Oxygen Saturation (Admit) 94 %   Oxygen Saturation (Exercise) 90 %   Oxygen Saturation (Exit) 95 %   Rating of Perceived Exertion (Exercise) 11   Perceived Dyspnea (Exercise) 1   Duration Progress to 45 minutes of aerobic exercise without signs/symptoms of physical distress   Intensity THRR unchanged     Progression   Progression Continue to progress workloads to maintain intensity without signs/symptoms of physical distress.     Resistance Training   Training Prescription Yes   Weight blue bands   Reps 10-12  10 minutes of strength training      Interval Training   Interval Training No     Treadmill   MPH 2.5   Grade 2   Minutes 17     Bike   Level 1.5   Minutes 17     Rower   Level 3   Minutes 17     Goals Met:  Exercise tolerated well Strength training completed today  Goals Unmet:  Not Applicable  Comments: Service time is from 1330 to 1500    Dr. Rush Farmer is Medical Director for Pulmonary Rehab at [facility].

## 2016-03-05 ENCOUNTER — Encounter (HOSPITAL_COMMUNITY)
Admission: RE | Admit: 2016-03-05 | Discharge: 2016-03-05 | Disposition: A | Discharge status: Home / Self Care | Payer: Medicare Other | Source: Ambulatory Visit | Attending: Pulmonary Disease | Admitting: Pulmonary Disease

## 2016-03-05 VITALS — Wt 223.1 lb

## 2016-03-05 DIAGNOSIS — J84112 Idiopathic pulmonary fibrosis: Secondary | ICD-10-CM | POA: Diagnosis not present

## 2016-03-05 NOTE — Progress Notes (Signed)
Daily Session Note  Patient Details  Name: Douglas Lopez MRN: [id number] Date of Birth: Feb 07, 1946 Referring Provider:   April Manson Pulmonary Rehab Walk Test from 02/06/2016 in [place]  Referring Provider  Dr. Nelda Marseille      Encounter Date: 03/05/2016  Check In:     Session Check In - 03/05/16 1347      Check-In   [facility]   Staff Present Rosebud Poles, RN, BSN;Molly diVincenzo, MS, ACSM RCEP, Exercise Physiologist;Portia Rollene Rotunda, RN, Roque Cash, RN   Supervising physician immediately available to respond to emergencies [facility] immediately available   Physician(s) Dr. Marily Memos   Medication changes reported     No   Fall or balance concerns reported    No   Warm-up and Cool-down Performed as group-led instruction   Resistance Training Performed Yes   VAD Patient? No     Pain Assessment   Currently in Pain? No/denies   Multiple Pain Sites No      Capillary Blood Glucose: No results found for this or any previous visit (from the past 24 hour(s)).      Exercise Prescription Changes - 03/05/16 1500      Response to Exercise   Blood Pressure (Admit) 140/80   Blood Pressure (Exercise) 130/82   Blood Pressure (Exit) 124/80   Heart Rate (Admit) 68 bpm   Heart Rate (Exercise) 111 bpm   Heart Rate (Exit) 62 bpm   Oxygen Saturation (Admit) 95 %   Oxygen Saturation (Exercise) 89 %   Oxygen Saturation (Exit) 97 %   Rating of Perceived Exertion (Exercise) 11   Perceived Dyspnea (Exercise) 1   Duration Progress to 45 minutes of aerobic exercise without signs/symptoms of physical distress   Intensity THRR unchanged     Progression   Progression Continue to progress workloads to maintain intensity without signs/symptoms of physical distress.     Resistance Training   Training Prescription Yes   Weight blue bands   Reps 10-12  10 minutes of strength training     Interval Training   Interval Training  No     Treadmill   MPH 2.5   Grade 2   Minutes 17     Rower   Level 3   Minutes 17     Goals Met:  Exercise tolerated well No report of cardiac concerns or symptoms Strength training completed today  Goals Unmet:  Not Applicable  Comments: Service time is from 1330 to 1510. He attended Meditation class with Jeanella Craze today.    Dr. Rush Farmer is Medical Director for Pulmonary Rehab at [facility].

## 2016-03-05 NOTE — Progress Notes (Signed)
Douglas Lopez 70 y.o. male Nutrition Note Spoke with pt. Pt is obese. There are some ways the pt can make his eating habits healthier.  Pt's Rate Your Plate results and ways pt can improve his eating habits reviewed with pt. Pt has started looking at the sodium content of food since starting the program. Pt trying to use fewer canned/ convenience foods. Pt reports he likes salty food. The role of sodium in lung disease reviewed with pt. Pt expressed understanding of the information reviewed via feedback method.    No results found for: HGBA1C  Nutrition Diagnosis ? Food-and nutrition-related knowledge deficit related to lack of exposure to information as related to diagnosis of pulmonary disease ? Obesity related to excessive energy intake as evidenced by a BMI of 35.1 Nutrition Intervention ? Pt's individual nutrition plan and goals reviewed with pt. ? Benefits of adopting healthy eating habits discussed when pt's Rate Your Plate reviewed. ? Pt to attend the Nutrition and Lung Disease class ? Continual client-centered nutrition education by RD, as part of interdisciplinary care. Goal(s) 1. Identify food quantities necessary to achieve wt loss of  -2# per week to a goal wt loss of 2.7-10.9 kg (6-24 lb) at graduation from pulmonary rehab. Monitor and Evaluate progress toward nutrition goal with team.   Mickle Plumb, M.Ed, RD, LDN, CDE 03/05/2016 3:20 PM

## 2016-03-10 ENCOUNTER — Encounter (HOSPITAL_COMMUNITY)
Admission: RE | Admit: 2016-03-10 | Discharge: 2016-03-10 | Disposition: A | Discharge status: Home / Self Care | Payer: Medicare Other | Source: Ambulatory Visit | Attending: Pulmonary Disease | Admitting: Pulmonary Disease

## 2016-03-10 VITALS — Wt 243.2 lb

## 2016-03-10 DIAGNOSIS — J84112 Idiopathic pulmonary fibrosis: Secondary | ICD-10-CM | POA: Diagnosis not present

## 2016-03-10 NOTE — Progress Notes (Signed)
Daily Session Note  Patient Details  Name: Douglas Lopez MRN: [id number] Date of Birth: August 16, 1945 Referring Provider:   April Manson Pulmonary Rehab Walk Test from 02/06/2016 in [place]  Referring Provider  Dr. Nelda Marseille      Encounter Date: 03/10/2016  Check In:     Session Check In - 03/10/16 1325      Check-In   Location [facility]   Staff Present Rosebud Poles, RN, BSN;Lisa Ysidro Evert, Felipe Drone, RN, MHA;Molly diVincenzo, MS, ACSM RCEP, Exercise Physiologist   Supervising physician immediately available to respond to emergencies [facility] immediately available   Physician(s) Dr. Marily Memos   Medication changes reported     No   Fall or balance concerns reported    No   Warm-up and Cool-down Performed as group-led instruction   Resistance Training Performed Yes   VAD Patient? No     Pain Assessment   Currently in Pain? No/denies   Multiple Pain Sites No      Capillary Blood Glucose: No results found for this or any previous visit (from the past 24 hour(s)).      Exercise Prescription Changes - 03/10/16 1500      Response to Exercise   Blood Pressure (Admit) 150/100   Blood Pressure (Exercise) 148/80   Blood Pressure (Exit) 140/84   Heart Rate (Admit) 79 bpm   Heart Rate (Exercise) 129 bpm   Heart Rate (Exit) 79 bpm   Oxygen Saturation (Admit) 97 %   Oxygen Saturation (Exercise) 85 %  increased to 88 quickly   Oxygen Saturation (Exit) 96 %   Rating of Perceived Exertion (Exercise) 11   Perceived Dyspnea (Exercise) 1   Duration Progress to 45 minutes of aerobic exercise without signs/symptoms of physical distress   Intensity THRR unchanged     Progression   Progression Continue to progress workloads to maintain intensity without signs/symptoms of physical distress.     Resistance Training   Training Prescription Yes   Weight blue bands   Reps --  10 minutes of strength training     Interval  Training   Interval Training No     Treadmill   MPH 2.5   Grade 2   Minutes 34     Goals Met:  Improved SOB with ADL's Exercise tolerated well Strength training completed today  Goals Unmet:  O2 Sat  Comments: Service time is from 1330 to 1445    Dr. Rush Farmer is Medical Director for Pulmonary Rehab at [facility].

## 2016-03-12 ENCOUNTER — Encounter (HOSPITAL_COMMUNITY)
Admission: RE | Admit: 2016-03-12 | Discharge: 2016-03-12 | Disposition: A | Discharge status: Home / Self Care | Payer: Medicare Other | Source: Ambulatory Visit | Attending: Pulmonary Disease | Admitting: Pulmonary Disease

## 2016-03-12 ENCOUNTER — Telehealth (HOSPITAL_COMMUNITY): Payer: Self-pay | Admitting: *Deleted

## 2016-03-12 VITALS — Wt 244.5 lb

## 2016-03-12 DIAGNOSIS — J84112 Idiopathic pulmonary fibrosis: Secondary | ICD-10-CM | POA: Diagnosis not present

## 2016-03-12 NOTE — Progress Notes (Signed)
Daily Session Note  Patient Details  Name: Douglas Lopez MRN: [id number] Date of Birth: 1945-09-30 Referring Provider:   April Manson Pulmonary Rehab Walk Test from 02/06/2016 in [place]  Referring Provider  Dr. Nelda Marseille      Encounter Date: 03/12/2016  Check In:     Session Check In - 03/12/16 1358      Check-In   Location [facility]   Staff Present Rosebud Poles, RN, BSN;Molly diVincenzo, MS, ACSM RCEP, Exercise Physiologist;Lisa Ysidro Evert, RN   Supervising physician immediately available to respond to emergencies [facility] immediately available   Physician(s) Dr. Marthenia Rolling   Medication changes reported     No   Fall or balance concerns reported    No   Warm-up and Cool-down Performed as group-led instruction   Resistance Training Performed Yes   VAD Patient? No     Pain Assessment   Currently in Pain? No/denies   Multiple Pain Sites No      Capillary Blood Glucose: No results found for this or any previous visit (from the past 24 hour(s)).      Exercise Prescription Changes - 03/12/16 1600      Response to Exercise   Blood Pressure (Admit) 144/80   Blood Pressure (Exercise) 160/86   Blood Pressure (Exit) 128/68   Heart Rate (Admit) 75 bpm   Heart Rate (Exercise) 106 bpm   Heart Rate (Exit) 79 bpm   Oxygen Saturation (Admit) 96 %   Oxygen Saturation (Exercise) 91 %   Oxygen Saturation (Exit) 96 %   Rating of Perceived Exertion (Exercise) 11   Perceived Dyspnea (Exercise) 1   Duration Progress to 45 minutes of aerobic exercise without signs/symptoms of physical distress   Intensity THRR unchanged     Progression   Progression Continue to progress workloads to maintain intensity without signs/symptoms of physical distress.     Resistance Training   Training Prescription Yes   Weight blue bands   Reps 10-12  10 minutes of strength training     Interval Training   Interval Training No     Bike   Level  1.5   Minutes 17     Rower   Level 3   Minutes 17     Goals Met:  Independence with exercise equipment Exercise tolerated well Strength training completed today  Goals Unmet:  BP  Called Dr. Rayetta Pigg office, [place]'s PCP to report elevated BP  Comments: Service time is from 1330 to 1530    Dr. Rush Farmer is Medical Director for Pulmonary Rehab at [facility].

## 2016-03-12 NOTE — Progress Notes (Signed)
Pulmonary Individual Treatment Plan  Patient Details  Name: Douglas Lopez MRN: [id number] Date of Birth: 1945-08-10 Referring Provider:   Doristine DevoidFlowsheet Row Pulmonary Rehab [facility]  Referring Provider  Dr. Molli KnockYacoub      Initial Encounter Date:  Flowsheet Row Pulmonary Rehab Walk [facility]  Date  02/07/16  Referring Provider  Dr. Molli KnockYacoub      Visit Diagnosis: Idiopathic pulmonary fibrosis (HCC)  Patient's Home Medications on Admission:   Current Outpatient Prescriptions:  .  amLODipine (NORVASC) 2.5 MG tablet, Take 2.5 mg by mouth daily., Disp: , Rfl:  .  amlodipine-benazepril (LOTREL) 2.5-10 MG capsule, Take 1 capsule by mouth daily., Disp: , Rfl:  .  aspirin 81 MG tablet, Take 81 mg by mouth daily., Disp: , Rfl:  .  calcium carbonate (TUMS - DOSED IN MG ELEMENTAL CALCIUM) 500 MG chewable tablet, Chew 1 tablet by mouth daily., Disp: , Rfl:  .  cholecalciferol (VITAMIN D) 1000 units tablet, Take 1,000 Units by mouth daily., Disp: , Rfl:  .  levothyroxine (SYNTHROID, LEVOTHROID) 125 MCG tablet, Take 125 mcg by mouth daily before breakfast., Disp: , Rfl:  .  Multiple Vitamins-Minerals (MULTIVITAMIN WITH MINERALS) tablet, Take 1 tablet by mouth daily., Disp: , Rfl:  .  omeprazole (PRILOSEC) 20 MG capsule, Take 20 mg by mouth daily., Disp: , Rfl:  .  pravastatin (PRAVACHOL) 20 MG tablet, Take 20 mg by mouth daily., Disp: , Rfl:   Past Medical History: Past Medical History:  Diagnosis Date  . Hypertension   . Idiopathic pulmonary fibrosis (HCC)   . Right bundle branch block     Tobacco Use: History  Smoking Status  . Never Smoker  Smokeless Tobacco  . Never Used    Labs: Recent Review Flowsheet Data    There is no flowsheet data to display.      Capillary Blood Glucose: No results found for: GLUCAP   ADL UCSD:     Pulmonary Assessment Scores    Row Name  01/23/16 0814         ADL UCSD   ADL Phase Entry     SOB Score total 62        Pulmonary Function Assessment:     Pulmonary Function Assessment - 01/20/16 1043      Breath   Bilateral Breath Sounds Clear   Shortness of Breath Yes      Exercise Target Goals:    Exercise Program Goal: Individual exercise prescription set with THRR, safety & activity barriers. Participant demonstrates ability to understand and report RPE using BORG scale, to self-measure pulse accurately, and to acknowledge the importance of the exercise prescription.  Exercise Prescription Goal: Starting with aerobic activity 30 plus minutes a day, 3 days per week for initial exercise prescription. Provide home exercise prescription and guidelines that participant acknowledges understanding prior to discharge.  Activity Barriers & Risk Stratification:     Activity Barriers & Cardiac Risk Stratification - 01/20/16 1041      Activity Barriers & Cardiac Risk Stratification   Activity Barriers Shortness of Breath;Deconditioning      6 Minute Walk:     6 Minute Walk    Row Name 02/07/16 0640         6 Minute Walk   Phase Initial     Distance 1720 feet     Walk Time 6 minutes     # of  Rest Breaks 0     MPH 3.25     METS 3.45     RPE 12     Perceived Dyspnea  1     Symptoms No     Resting HR 93 bpm     Resting BP 132/80     Max Ex. HR 121 bpm     Max Ex. BP 140/84     2 Minute Post BP 144/70       Interval Oxygen   Interval Oxygen? Yes     Baseline Oxygen Saturation % 97 %     Baseline Liters of Oxygen 0 L     1 Minute Oxygen Saturation % 95 %     1 Minute Liters of Oxygen 0 L     2 Minute Oxygen Saturation % 91 %     2 Minute Liters of Oxygen 0 L     3 Minute Oxygen Saturation % 91 %     3 Minute Liters of Oxygen 0 L     4 Minute Oxygen Saturation % 92 %     4 Minute Liters of Oxygen 0 L     5 Minute Oxygen Saturation % 89 %     5 Minute Liters of Oxygen 0 L     6 Minute Oxygen  Saturation % 92 %     6 Minute Liters of Oxygen 0 L     2 Minute Post Oxygen Saturation % 97 %     2 Minute Post Liters of Oxygen 0 L        Initial Exercise Prescription:     Initial Exercise Prescription - 02/07/16 0600      Date of Initial Exercise RX and Referring Provider   Date 02/07/16   Referring Provider Dr. Nelda Marseille     Treadmill   MPH 1.7   Grade 0   Minutes 17     Bike   Level 0.5   Minutes 17     Rower   Level 1   Minutes 17     Prescription Details   Frequency (times per week) 2   Duration Progress to 45 minutes of aerobic exercise without signs/symptoms of physical distress     Intensity   THRR 40-80% of Max Heartrate 60-120   Ratings of Perceived Exertion 11-13   Perceived Dyspnea 0-4     Progression   Progression Continue progressive overload as per policy without signs/symptoms or physical distress.     Resistance Training   Training Prescription Yes   Weight blue band   Reps 10-12      Perform Capillary Blood Glucose checks as needed.  Exercise Prescription Changes:     Exercise Prescription Changes    Row Name 02/13/16 1600 02/18/16 1500 02/20/16 1600 02/25/16 1500 02/27/16 1500     Exercise Review   Progression Yes Yes Yes  - Yes     Response to Exercise   Blood Pressure (Admit) 142/72 134/60 124/68 126/74 134/70   Blood Pressure (Exercise) 160/84 158/82 154/84 150/70 150/80   Blood Pressure (Exit) 112/72 110/60 122/70 116/70 110/78   Heart Rate (Admit) 81 bpm 69 bpm 76 bpm 70 bpm 82 bpm   Heart Rate (Exercise) 99 bpm 126 bpm 102 bpm 110 bpm 113 bpm   Heart Rate (Exit) 67 bpm 95 bpm 86 bpm 85 bpm 87 bpm   Oxygen Saturation (Admit) 96 % 94 % 98 % 96 % 95 %   Oxygen Saturation (Exercise) 93 %  89 % 90 % 91 % 89 %   Oxygen Saturation (Exit) 95 % 94 % 96 % 96 % 95 %   Rating of Perceived Exertion (Exercise) 9 11 11 12 11    Perceived Dyspnea (Exercise) 1 2 1 1  0   Duration Progress to 45 minutes of aerobic exercise without  signs/symptoms of physical distress Progress to 45 minutes of aerobic exercise without signs/symptoms of physical distress Progress to 45 minutes of aerobic exercise without signs/symptoms of physical distress Progress to 45 minutes of aerobic exercise without signs/symptoms of physical distress Progress to 45 minutes of aerobic exercise without signs/symptoms of physical distress   Intensity THRR unchanged THRR unchanged THRR unchanged THRR unchanged THRR unchanged     Progression   Progression Continue to progress workloads to maintain intensity without signs/symptoms of physical distress. Continue to progress workloads to maintain intensity without signs/symptoms of physical distress. Continue to progress workloads to maintain intensity without signs/symptoms of physical distress. Continue to progress workloads to maintain intensity without signs/symptoms of physical distress. Continue to progress workloads to maintain intensity without signs/symptoms of physical distress.     Resistance Training   Training Prescription Yes Yes Yes Yes Yes   Weight blue bands blue bands blue bands blue bands blue bands   Reps 10-12 10-12  10 minutes of strength training 10-12  10 minutes of strength training 10-12  10 minutes of strength training 10-12  10 minutes of strength training     Interval Training   Interval Training No No No No No     Treadmill   MPH 1.7 2.5  - 2.5 2.5   Grade 0 1  - 1 2   Minutes 17 17  - 17 17     Bike   Level  - 1.3 1.5 1.5 1.5   Minutes  - 17 17 17 17      Rower   Level 2 2 4 4   -   Minutes 17 17 17 17   -   Row Name 03/03/16 1500 03/03/16 1600 03/05/16 1500 03/10/16 1500       Exercise Review   Progression  - Yes  -  -      Response to Exercise   Blood Pressure (Admit)  - 120/64 140/80 150/100    Blood Pressure (Exercise)  - 152/82 130/82 148/80    Blood Pressure (Exit)  - 120/60 124/80 140/84    Heart Rate (Admit)  - 83 bpm 68 bpm 79 bpm    Heart Rate  (Exercise)  - 116 bpm 111 bpm 129 bpm    Heart Rate (Exit)  - 86 bpm 62 bpm 79 bpm    Oxygen Saturation (Admit)  - 94 % 95 % 97 %    Oxygen Saturation (Exercise)  - 90 % 89 % 85 %  increased to 88 quickly    Oxygen Saturation (Exit)  - 95 % 97 % 96 %    Rating of Perceived Exertion (Exercise)  - 11 11 11     Perceived Dyspnea (Exercise)  - 1 1 1     Duration  - Progress to 45 minutes of aerobic exercise without signs/symptoms of physical distress Progress to 45 minutes of aerobic exercise without signs/symptoms of physical distress Progress to 45 minutes of aerobic exercise without signs/symptoms of physical distress    Intensity  - THRR unchanged THRR unchanged THRR unchanged      Progression   Progression  - Continue to progress workloads to maintain intensity without  signs/symptoms of physical distress. Continue to progress workloads to maintain intensity without signs/symptoms of physical distress. Continue to progress workloads to maintain intensity without signs/symptoms of physical distress.      Resistance Training   Training Prescription  - Yes Yes Yes    Weight  - blue bands blue bands blue bands    Reps  - 10-12  10 minutes of strength training 10-12  10 minutes of strength training -  10 minutes of strength training      Interval Training   Interval Training  - No No No      Treadmill   MPH  - 2.5 2.5 2.5    Grade  - 2 2 2     Minutes  - 17 17 34      Bike   Level  - 1.5  -  -    Minutes  - 17  -  -      Rower   Level  - 3 3  -    Minutes  - 17 17  -      Home Exercise Plan   Plans to continue exercise at Home  -  -  -    Frequency Add 3 additional days to program exercise sessions.  -  -  -       Exercise Comments:     Exercise Comments    Row Name 02/13/16 1627 03/03/16 1536 03/12/16 0836       Exercise Comments Today is first day of exercise, tolerated well. Home exercise prescription completed Patient is progressing well in program. We are keeping a  close eye on oxygen saturations during exercise. Patient has reported oxygen saturations in the high 70's during exercise at home. The lowest we have seen here in pulmonary rehab is 85% on one occasion--most of the time 88%-91%. Will cont. to monitor and progress when appropriate.        Discharge Exercise Prescription (Final Exercise Prescription Changes):     Exercise Prescription Changes - 03/10/16 1500      Response to Exercise   Blood Pressure (Admit) 150/100   Blood Pressure (Exercise) 148/80   Blood Pressure (Exit) 140/84   Heart Rate (Admit) 79 bpm   Heart Rate (Exercise) 129 bpm   Heart Rate (Exit) 79 bpm   Oxygen Saturation (Admit) 97 %   Oxygen Saturation (Exercise) 85 %  increased to 88 quickly   Oxygen Saturation (Exit) 96 %   Rating of Perceived Exertion (Exercise) 11   Perceived Dyspnea (Exercise) 1   Duration Progress to 45 minutes of aerobic exercise without signs/symptoms of physical distress   Intensity THRR unchanged     Progression   Progression Continue to progress workloads to maintain intensity without signs/symptoms of physical distress.     Resistance Training   Training Prescription Yes   Weight blue bands   Reps --  10 minutes of strength training     Interval Training   Interval Training No     Treadmill   MPH 2.5   Grade 2   Minutes 34       Nutrition:  Target Goals: Understanding of nutrition guidelines, daily intake of sodium 1500mg , cholesterol 200mg , calories 30% from fat and 7% or less from saturated fats, daily to have 5 or more servings of fruits and vegetables.  Biometrics:     Pre Biometrics - 01/20/16 1050      Pre Biometrics   Grip Strength 35 kg  Nutrition Therapy Plan and Nutrition Goals:     Nutrition Therapy & Goals - 03/05/16 1519      Nutrition Therapy   Diet Therapeutic Lifestyle Change     Personal Nutrition Goals   Personal Goal #1 1-2 lb wt loss per week to a wt loss goal of 6-24 lb at  graduation from Pulmonary Rehab     Intervention Plan   Intervention Prescribe, educate and counsel regarding individualized specific dietary modifications aiming towards targeted core components such as weight, hypertension, lipid management, diabetes, heart failure and other comorbidities.   Expected Outcomes Short Term Goal: Understand basic principles of dietary content, such as calories, fat, sodium, cholesterol and nutrients.;Long Term Goal: Adherence to prescribed nutrition plan.      Nutrition Discharge: Rate Your Plate Scores:     Nutrition Assessments - 03/05/16 1518      Rate Your Plate Scores   Pre Score 49      Psychosocial: Target Goals: Acknowledge presence or absence of depression, maximize coping skills, provide positive support system. Participant is able to verbalize types and ability to use techniques and skills needed for reducing stress and depression.  Initial Review & Psychosocial Screening:     Initial Psych Review & Screening - 01/20/16 1103      Initial Review   Current issues with --  none identified     Family Dynamics   Good Support System? Yes   Concerns --  none identified     Barriers   Psychosocial barriers to participate in program There are no identifiable barriers or psychosocial needs.     Screening Interventions   Interventions Encouraged to exercise      Quality of Life Scores:     Quality of Life - 01/23/16 0813      Quality of Life Scores   Health/Function Pre 16.33 %   Socioeconomic Pre 20.63 %   Psych/Spiritual Pre 20.5 %   Family Pre 20.25 %   GLOBAL Pre 18.66 %      PHQ-9: Recent Review Flowsheet Data    Depression screen [facility] 2/9 01/20/2016   Decreased Interest 0   Down, Depressed, Hopeless 0   PHQ - 2 Score 0      Psychosocial Evaluation and Intervention:     Psychosocial Evaluation - 01/20/16 1104      Psychosocial Evaluation & Interventions   Interventions --  none   Comments none required    Continued Psychosocial Services Needed No      Psychosocial Re-Evaluation:     Psychosocial Re-Evaluation    Row Name 03/10/16 [phone]             Psychosocial Re-Evaluation   Interventions Encouraged to attend Pulmonary Rehabilitation for the exercise       Comments -  no psychosocial concerns identified       Continued Psychosocial Services Needed No         Education: Education Goals: Education classes will be provided on a weekly basis, covering required topics. Participant will state understanding/return demonstration of topics presented.  Learning Barriers/Preferences:     Learning Barriers/Preferences - 01/20/16 1042      Learning Barriers/Preferences   Learning Barriers None   Learning Preferences Written Material;Video;Verbal Instruction;Skilled Demonstration;Pictoral;Individual Instruction;Group Instruction;Computer/Internet;Audio      Education Topics: Risk Factor Reduction:  -Group instruction that is supported by a PowerPoint presentation. Instructor discusses the definition of a risk factor, different risk factors for pulmonary disease, and how the heart and lungs work  together.     Nutrition for Pulmonary Patient:  -Group instruction provided by PowerPoint slides, verbal discussion, and written materials to support subject matter. The instructor gives an explanation and review of healthy diet recommendations, which includes a discussion on weight management, recommendations for fruit and vegetable consumption, as well as protein, fluid, caffeine, fiber, sodium, sugar, and alcohol. Tips for eating when patients are short of breath are discussed.   Pursed Lip Breathing:  -Group instruction that is supported by demonstration and informational handouts. Instructor discusses the benefits of pursed lip and diaphragmatic breathing and detailed demonstration on how to preform both.   Flowsheet Row PULMONARY REHAB OTHER RESPIRATORY [facility]27/[facility]  Date  02/27/16  Educator  EP  Instruction Review Code  2- meets goals/outcomes      Oxygen Safety:  -Group instruction provided by PowerPoint, verbal discussion, and written material to support subject matter. There is an overview of "What is Oxygen" and "Why do we need it".  Instructor also reviews how to create a safe environment for oxygen use, the importance of using oxygen as prescribed, and the risks of noncompliance. There is a brief discussion on traveling with oxygen and resources the patient may utilize.   Oxygen Equipment:  -Group instruction provided by [facility] Staff utilizing handouts, written materials, and equipment demonstrations.   Signs and Symptoms:  -Group instruction provided by written material and verbal discussion to support subject matter. Warning signs and symptoms of infection, stroke, and heart attack are reviewed and when to call the physician/911 reinforced. Tips for preventing the spread of infection discussed. Flowsheet Row PULMONARY REHAB OTHER RESPIRATORY from 02/27/2016 in [facility]  Date  02/13/16  Educator  RN  Instruction Review Code  2- meets goals/outcomes      Advanced Directives:  -Group instruction provided by verbal instruction and written material to support subject matter. Instructor reviews Advanced Directive laws and proper instruction for filling out document.   Pulmonary Video:  -Group video education that reviews the importance of medication and oxygen compliance, exercise, good nutrition, pulmonary hygiene, and pursed lip and diaphragmatic breathing for the pulmonary patient.   Exercise for the Pulmonary Patient:  -Group instruction that is supported by a PowerPoint presentation. Instructor discusses benefits of exercise, core components of exercise, frequency, duration, and intensity of an exercise routine, importance of utilizing pulse oximetry during exercise,  safety while exercising, and options of places to exercise outside of rehab.     Pulmonary Medications:  -Verbally interactive group education provided by instructor with focus on inhaled medications and proper administration.   Anatomy and Physiology of the Respiratory System and Intimacy:  -Group instruction provided by PowerPoint, verbal discussion, and written material to support subject matter. Instructor reviews respiratory cycle and anatomical components of the respiratory system and their functions. Instructor also reviews differences in obstructive and restrictive respiratory diseases with examples of each. Intimacy, Sex, and Sexuality differences are reviewed with a discussion on how relationships can change when diagnosed with pulmonary disease. Common sexual concerns are reviewed. Flowsheet Row PULMONARY REHAB OTHER RESPIRATORY from 02/27/2016 in [facility]  Date  02/20/16  Educator  RN  Instruction Review Code  2- meets goals/outcomes      Knowledge Questionnaire Score:     Knowledge Questionnaire Score - 01/23/16 0813      Knowledge Questionnaire Score   Pre Score 8/13      Core Components/Risk  Factors/Patient Goals at Admission:     Personal Goals and Risk Factors at Admission - 01/20/16 1053      Core Components/Risk Factors/Patient Goals on Admission    Weight Management Obesity   Increase Strength and Stamina Yes   Intervention Provide advice, education, support and counseling about physical activity/exercise needs.;Develop an individualized exercise prescription for aerobic and resistive training based on initial evaluation findings, risk stratification, comorbidities and participant's personal goals.   Expected Outcomes Achievement of increased cardiorespiratory fitness and enhanced flexibility, muscular endurance and strength shown through measurements of functional capacity and personal statement of participant.   Improve  shortness of breath with ADL's Yes   Intervention Provide education, individualized exercise plan and daily activity instruction to help decrease symptoms of SOB with activities of daily living.   Expected Outcomes Short Term: Achieves a reduction of symptoms when performing activities of daily living.      Core Components/Risk Factors/Patient Goals Review:      Goals and Risk Factor Review    Row Name 01/20/16 1101 02/13/16 1629 03/10/16 0842         Core Components/Risk Factors/Patient Goals Review   Personal Goals Review Weight Management/Obesity;Increase Strength and Stamina;Improve shortness of breath with ADL's Weight Management/Obesity;Increase Strength and Stamina;Improve shortness of breath with ADL's  -     Review To increase strength and stamina to improve shortness of breath, and lose weight to improve breathing First day of exercise, tolerated well. has lost 1.5kg, very strong, tolerating workloads increases well     Expected Outcomes Improved strength and stamina with also weight loss. Should see progress in next 30 days, will progress as tolerated. continue to increase workloads as tolerated to increase strength and stamina        Core Components/Risk Factors/Patient Goals at Discharge (Final Review):      Goals and Risk Factor Review - 03/10/16 0842      Core Components/Risk Factors/Patient Goals Review   Review has lost 1.5kg, very strong, tolerating workloads increases well   Expected Outcomes continue to increase workloads as tolerated to increase strength and stamina      ITP Comments:   Comments: ITP REVIEW Pt is making expected progress toward personal goals after completing 8 sessions.   Recommend continued exercise, life style modification, education, and utilization of breathing techniques to increase stamina and strength and decrease shortness of breath with exertion.

## 2016-03-17 ENCOUNTER — Encounter (HOSPITAL_COMMUNITY)
Admission: RE | Admit: 2016-03-17 | Discharge: 2016-03-17 | Disposition: A | Discharge status: Home / Self Care | Payer: Medicare Other | Source: Ambulatory Visit | Attending: Pulmonary Disease | Admitting: Pulmonary Disease

## 2016-03-17 VITALS — Wt 244.0 lb

## 2016-03-17 DIAGNOSIS — J84112 Idiopathic pulmonary fibrosis: Secondary | ICD-10-CM

## 2016-03-17 NOTE — Progress Notes (Signed)
Daily Session Note  Patient Details  Name: Douglas Lopez MRN: [id number] Date of Birth: 03/13/1946 Referring Provider:   April Manson Pulmonary Rehab Walk Test from 02/06/2016 in [facility]  Referring Provider  Dr. Nelda Marseille      Encounter Date: 03/17/2016  Check In:     Session Check In - 03/17/16 1330      Check-In   Location [facility]   Staff Present Rosebud Poles, RN, BSN;Molly diVincenzo, MS, ACSM RCEP, Exercise Physiologist;Lisa Ysidro Evert, Felipe Drone, RN, MHA;Kyrsten Deleeuw Rollene Rotunda, RN, BSN   Supervising physician immediately available to respond to emergencies [facility] immediately available   Physician(s) Dr. Marily Memos   Medication changes reported     Yes   Comments amlodipine 5 mg bid   Fall or balance concerns reported    No   Warm-up and Cool-down Performed as group-led instruction   Resistance Training Performed Yes   VAD Patient? No     Pain Assessment   Currently in Pain? No/denies   Multiple Pain Sites No      Capillary Blood Glucose: No results found for this or any previous visit (from the past 24 hour(s)).      Exercise Prescription Changes - 03/17/16 1551      Response to Exercise   Blood Pressure (Admit) 140/74   Blood Pressure (Exercise) 156/76   Blood Pressure (Exit) 126/82   Heart Rate (Admit) 77 bpm   Heart Rate (Exercise) 100 bpm   Heart Rate (Exit) 79 bpm   Oxygen Saturation (Admit) 97 %   Oxygen Saturation (Exercise) 85 %   Oxygen Saturation (Exit) 97 %   Rating of Perceived Exertion (Exercise) 12   Perceived Dyspnea (Exercise) 1   Duration Progress to 45 minutes of aerobic exercise without signs/symptoms of physical distress   Intensity THRR unchanged     Progression   Progression Continue to progress workloads to maintain intensity without signs/symptoms of physical distress.     Resistance Training   Training Prescription Yes   Weight blue bands   Reps 10-12  10 minutes of  strength training     Interval Training   Interval Training No     Treadmill   MPH 2.5   Grade 2   Minutes 17     Bike   Level 1.5   Minutes 17     Rower   Level 3   Minutes 17     Goals Met:  Improved SOB with ADL's Using PLB without cueing & demonstrates good technique Exercise tolerated well No report of cardiac concerns or symptoms Strength training completed today  Goals Unmet:  O2 Sat, recovered with rest and PLB  Comments: Service time is from 1330 to 1510   Dr. Rush Farmer is Medical Director for Pulmonary Rehab at [facility].

## 2016-03-19 ENCOUNTER — Encounter (HOSPITAL_COMMUNITY): Payer: Medicare Other

## 2016-03-24 ENCOUNTER — Encounter (HOSPITAL_COMMUNITY)
Admission: RE | Admit: 2016-03-24 | Discharge: 2016-03-24 | Disposition: A | Discharge status: Home / Self Care | Payer: Medicare Other | Source: Ambulatory Visit | Attending: Pulmonary Disease | Admitting: Pulmonary Disease

## 2016-03-24 DIAGNOSIS — J84112 Idiopathic pulmonary fibrosis: Secondary | ICD-10-CM | POA: Diagnosis not present

## 2016-03-24 NOTE — Progress Notes (Signed)
Daily Session Note  Patient Details  Name: Douglas Lopez MRN: [id number] Date of Birth: Aug 11, 1945 Referring Provider:   April Manson Pulmonary Rehab Walk Test from 02/06/2016 in [place]  Referring Provider  Dr. Nelda Marseille      Encounter Date: 03/24/2016  Check In:   Capillary Blood Glucose: No results found for this or any previous visit (from the past 24 hour(s)).      Exercise Prescription Changes - 03/24/16 1520      Response to Exercise   Blood Pressure (Admit) 146/76   Blood Pressure (Exercise) 142/72   Blood Pressure (Exit) 122/76   Heart Rate (Admit) 82 bpm   Heart Rate (Exercise) 115 bpm   Heart Rate (Exit) 85 bpm   Oxygen Saturation (Admit) 96 %   Oxygen Saturation (Exercise) 90 %   Oxygen Saturation (Exit) 96 %   Rating of Perceived Exertion (Exercise) 12   Perceived Dyspnea (Exercise) 1   Duration Progress to 45 minutes of aerobic exercise without signs/symptoms of physical distress   Intensity THRR unchanged     Progression   Progression Continue to progress workloads to maintain intensity without signs/symptoms of physical distress.     Resistance Training   Training Prescription Yes   Weight blue bands   Reps 10-12  10 minutes of strength training     Interval Training   Interval Training No     Treadmill   MPH 2.5   Grade 2   Minutes 17     Bike   Level 1.5   Minutes 17     Rower   Level 3   Minutes 17     Goals Met:  [place] with exercise equipment Improved SOB with ADL's Using PLB without cueing & demonstrates good technique Exercise tolerated well No report of cardiac concerns or symptoms Strength training completed today  Goals Unmet:  Not Applicable  Comments: Service time is from 1330 to 1500   Dr. Rush Farmer is Medical Director for Pulmonary Rehab at [facility].

## 2016-03-26 ENCOUNTER — Encounter (HOSPITAL_COMMUNITY)
Admission: RE | Admit: 2016-03-26 | Discharge: 2016-03-26 | Disposition: A | Discharge status: Home / Self Care | Payer: Medicare Other | Source: Ambulatory Visit | Attending: Pulmonary Disease | Admitting: Pulmonary Disease

## 2016-03-26 VITALS — Wt 241.6 lb

## 2016-03-26 DIAGNOSIS — J84112 Idiopathic pulmonary fibrosis: Secondary | ICD-10-CM

## 2016-03-26 NOTE — Progress Notes (Signed)
Daily Session Note  Patient Details  Name: Douglas Lopez MRN: [id number] Date of Birth: [birth year] Referring Provider:   April Manson Pulmonary Rehab Walk Test from 02/06/2016 in [place]  Referring Provider  Dr. Nelda Marseille      Encounter Date: 03/26/2016  Check In:     Session Check In - 03/26/16 1547      Check-In   Location [facility]   Staff Present Su Hilt, MS, ACSM RCEP, Exercise Physiologist;Andi Layfield Ysidro Evert, RN;Portia Rollene Rotunda, Therapist, sports, BSN;Ramon Dredge, RN, [facility]   Supervising physician immediately available to respond to emergencies [facility] immediately available   Physician(s) Dr. Clemencia Course   Medication changes reported     No   Fall or balance concerns reported    No   Warm-up and Cool-down Performed as group-led instruction   Resistance Training Performed Yes   VAD Patient? No     Pain Assessment   Currently in Pain? No/denies   Multiple Pain Sites No      Capillary Blood Glucose: No results found for this or any previous visit (from the past 24 hour(s)).      Exercise Prescription Changes - 03/26/16 1500      Exercise Review   Progression Yes     Response to Exercise   Blood Pressure (Admit) 124/78   Blood Pressure (Exercise) 124/60   Blood Pressure (Exit) 124/76   Heart Rate (Admit) 82 bpm   Heart Rate (Exercise) 109 bpm   Heart Rate (Exit) 95 bpm   Oxygen Saturation (Admit) 96 %   Oxygen Saturation (Exercise) 91 %   Oxygen Saturation (Exit) 95 %   Rating of Perceived Exertion (Exercise) 12   Perceived Dyspnea (Exercise) 1   Duration Progress to 45 minutes of aerobic exercise without signs/symptoms of physical distress   Intensity THRR unchanged     Progression   Progression Continue to progress workloads to maintain intensity without signs/symptoms of physical distress.     Resistance Training   Training Prescription Yes   Weight blue bands   Reps 10-12  10 minutes of strength  training     Interval Training   Interval Training No     Treadmill   MPH 2.7   Grade 4   Minutes 17     Rower   Level 3   Minutes 17     Goals Met:  Exercise tolerated well No report of cardiac concerns or symptoms Strength training completed today  Goals Unmet:  Not Applicable  Comments: Service time is from 1330 to 1530    Dr. Rush Farmer is Medical Director for Pulmonary Rehab at [facility].

## 2016-03-31 ENCOUNTER — Encounter (HOSPITAL_COMMUNITY)
Admission: RE | Admit: 2016-03-31 | Discharge: 2016-03-31 | Disposition: A | Discharge status: Home / Self Care | Payer: Medicare Other | Source: Ambulatory Visit | Attending: Pulmonary Disease | Admitting: Pulmonary Disease

## 2016-03-31 VITALS — Wt 240.7 lb

## 2016-03-31 DIAGNOSIS — J84112 Idiopathic pulmonary fibrosis: Secondary | ICD-10-CM | POA: Diagnosis not present

## 2016-03-31 NOTE — Progress Notes (Signed)
Daily Session Note  Patient Details  Name: Douglas Lopez MRN: [id number] Date of Birth: May 22, 1946 Referring Provider:   April Manson Pulmonary Rehab Walk Test from 02/06/2016 in [place]  Referring Provider  Dr. Nelda Marseille      Encounter Date: 03/31/2016  Check In:     Session Check In - 03/31/16 1321      Check-In   [facility]   Staff Present Rosebud Poles, RN, BSN;Molly diVincenzo, MS, ACSM RCEP, Exercise Physiologist;Damarrion Mimbs Ysidro Evert, RN;Portia Rollene Rotunda, RN, BSN   Supervising physician immediately available to respond to emergencies [facility] immediately available   Physician(s) Dr. Marily Memos   Medication changes reported     No   Fall or balance concerns reported    No   Warm-up and Cool-down Performed as group-led instruction   Resistance Training Performed Yes   VAD Patient? No     Pain Assessment   Currently in Pain? No/denies   Multiple Pain Sites No      Capillary Blood Glucose: No results found for this or any previous visit (from the past 24 hour(s)).      Exercise Prescription Changes - 03/31/16 1500      Response to Exercise   Blood Pressure (Admit) 142/84   Blood Pressure (Exercise) 140/84   Blood Pressure (Exit) 118/60   Heart Rate (Admit) 77 bpm   Heart Rate (Exercise) 112 bpm   Heart Rate (Exit) 82 bpm   Oxygen Saturation (Admit) 95 %   Oxygen Saturation (Exercise) 92 %   Oxygen Saturation (Exit) 94 %   Rating of Perceived Exertion (Exercise) 11   Perceived Dyspnea (Exercise) 1   Duration Progress to 45 minutes of aerobic exercise without signs/symptoms of physical distress   Intensity THRR unchanged     Progression   Progression Continue to progress workloads to maintain intensity without signs/symptoms of physical distress.     Resistance Training   Training Prescription Yes   Weight blue bands   Reps 10-12  10 minutes of strength training     Interval Training   Interval Training  No     Treadmill   MPH 2.7   Grade 4   Minutes 17     Bike   Level 1.5   Minutes 17     Rower   Level 3   Minutes 17     Goals Met:  Exercise tolerated well No report of cardiac concerns or symptoms Strength training completed today  Goals Unmet:  Not Applicable  Comments: Service time is from 1330 to 1500    Dr. Rush Farmer is Medical Director for Pulmonary Rehab at [facility].

## 2016-04-02 ENCOUNTER — Encounter (HOSPITAL_COMMUNITY): Payer: Medicare Other

## 2016-04-07 ENCOUNTER — Encounter (HOSPITAL_COMMUNITY)
Admission: RE | Admit: 2016-04-07 | Discharge: 2016-04-07 | Disposition: A | Discharge status: Home / Self Care | Payer: Medicare Other | Source: Ambulatory Visit | Attending: Pulmonary Disease | Admitting: Pulmonary Disease

## 2016-04-07 VITALS — Wt 241.0 lb

## 2016-04-07 DIAGNOSIS — J84112 Idiopathic pulmonary fibrosis: Secondary | ICD-10-CM | POA: Diagnosis not present

## 2016-04-07 NOTE — Progress Notes (Signed)
Daily Session Note  Patient Details  Name: Douglas Lopez MRN: [id number] Date of Birth: 17-Mar-1946 Referring Provider:   April Manson Pulmonary Rehab Walk Test from 02/06/2016 in [place]  Referring Provider  Dr. Nelda Marseille      Encounter Date: 04/07/2016  Check In:     Session Check In - 04/07/16 1338      Check-In   [facility]   Staff Present Rosebud Poles, RN, BSN;Ramon Dredge, RN, MHA;Portia Rollene Rotunda, RN, Roque Cash, RN   Supervising physician immediately available to respond to emergencies [facility] immediately available   Physician(s) Dr. Marily Memos   Medication changes reported     No   Fall or balance concerns reported    No   Warm-up and Cool-down Performed as group-led instruction   VAD Patient? No     Pain Assessment   Currently in Pain? No/denies   Multiple Pain Sites No      Capillary Blood Glucose: No results found for this or any previous visit (from the past 24 hour(s)).      Exercise Prescription Changes - 04/07/16 1500      Response to Exercise   Blood Pressure (Admit) 140/74   Blood Pressure (Exercise) 148/68   Blood Pressure (Exit) 108/68   Heart Rate (Admit) 77 bpm   Heart Rate (Exercise) 119 bpm   Heart Rate (Exit) 88 bpm   Oxygen Saturation (Admit) 96 %   Oxygen Saturation (Exercise) 88 %   Oxygen Saturation (Exit) 96 %   Rating of Perceived Exertion (Exercise) 12   Perceived Dyspnea (Exercise) 2   Duration Progress to 45 minutes of aerobic exercise without signs/symptoms of physical distress   Intensity THRR unchanged     Progression   Progression Continue to progress workloads to maintain intensity without signs/symptoms of physical distress.     Resistance Training   Training Prescription Yes   Weight blue bands   Reps 10-12  10 minutes of strength training     Interval Training   Interval Training No     Treadmill   MPH 2.7   Grade 4   Minutes 17     Bike   Level 1.5   Minutes 17     Rower   Level 3   Minutes 17     Goals Met:  Exercise tolerated well Strength training completed today  Goals Unmet:  Not Applicable  Comments: Service time is from 1330 to 1515    Dr. Rush Farmer is Medical Director for Pulmonary Rehab at [facility].

## 2016-04-09 ENCOUNTER — Encounter (HOSPITAL_COMMUNITY)
Admission: RE | Admit: 2016-04-09 | Discharge: 2016-04-09 | Disposition: A | Discharge status: Home / Self Care | Payer: Medicare Other | Source: Ambulatory Visit | Attending: Pulmonary Disease | Admitting: Pulmonary Disease

## 2016-04-09 DIAGNOSIS — J84112 Idiopathic pulmonary fibrosis: Secondary | ICD-10-CM | POA: Diagnosis not present

## 2016-04-09 NOTE — Progress Notes (Signed)
Daily Session Note  Patient Details  Name: Douglas Lopez MRN: [id number] Date of Birth: [birth year] Referring Provider:   April Manson Pulmonary Rehab Walk Test from 02/06/2016 in [place]  Referring Provider  Dr. Nelda Marseille      Encounter Date: 04/09/2016  Check In:     Session Check In - 04/09/16 1330      Check-In   [facility]   Staff Present Rosebud Poles, RN, BSN;Molly diVincenzo, MS, ACSM RCEP, Exercise Physiologist;Annedrea Rosezella [place], RN, MHA;Portia Rollene Rotunda, RN, BSN   Supervising physician immediately available to respond to emergencies [facility] immediately available   Physician(s) Dr. Allyson Sabal   Medication changes reported     No   Fall or balance concerns reported    No   Warm-up and Cool-down Performed as group-led instruction   Resistance Training Performed Yes   VAD Patient? No     Pain Assessment   Currently in Pain? No/denies   Multiple Pain Sites No      Capillary Blood Glucose: No results found for this or any previous visit (from the past 24 hour(s)).      Exercise Prescription Changes - 04/09/16 1600      Response to Exercise   Blood Pressure (Admit) 138/70   Blood Pressure (Exercise) 138/82   Blood Pressure (Exit) 122/76   Heart Rate (Admit) 88 bpm   Heart Rate (Exercise) 122 bpm   Heart Rate (Exit) 88 bpm   Oxygen Saturation (Admit) 96 %   Oxygen Saturation (Exercise) 88 %   Oxygen Saturation (Exit) 96 %   Rating of Perceived Exertion (Exercise) 12   Perceived Dyspnea (Exercise) 1   Duration Progress to 45 minutes of aerobic exercise without signs/symptoms of physical distress   Intensity THRR unchanged     Progression   Progression Continue to progress workloads to maintain intensity without signs/symptoms of physical distress.     Resistance Training   Training Prescription Yes   Weight blue bands   Reps 10-12  10 minutes of strength training     Interval Training   Interval Training No     Treadmill   MPH 2.7   Grade 4   Minutes 17     Rower   Level 3   Minutes 17     Goals Met:  Independence with exercise equipment Improved SOB with ADL's Exercise tolerated well Strength training completed today  Goals Unmet:  Not Applicable  Comments: Service time is from 1330 to 1515    Dr. Rush Farmer is Medical Director for Pulmonary Rehab at [facility].

## 2016-04-14 ENCOUNTER — Encounter (HOSPITAL_COMMUNITY)
Admission: RE | Admit: 2016-04-14 | Discharge: 2016-04-14 | Disposition: A | Discharge status: Home / Self Care | Payer: Medicare Other | Source: Ambulatory Visit | Attending: Pulmonary Disease | Admitting: Pulmonary Disease

## 2016-04-14 VITALS — Wt 240.7 lb

## 2016-04-14 DIAGNOSIS — J84112 Idiopathic pulmonary fibrosis: Secondary | ICD-10-CM

## 2016-04-14 NOTE — Progress Notes (Signed)
Daily Session Note  Patient Details  Name: Douglas Lopez MRN: [id number] Date of Birth: 1946-01-05 Referring Provider:   April Manson Pulmonary Rehab Walk Test from 02/06/2016 in [place]  Referring Provider  Dr. Nelda Marseille      Encounter Date: 04/14/2016  Check In:     Session Check In - 04/14/16 1347      Check-In   [facility]   Staff Present Su Hilt, MS, ACSM RCEP, Exercise Physiologist;Izaak Sahr Ysidro Evert, Felipe Drone, RN, MHA;Portia Rollene Rotunda, RN, BSN   Supervising physician immediately available to respond to emergencies [facility] immediately available   Physician(s) Dr. Marily Memos   Medication changes reported     No   Fall or balance concerns reported    No   Warm-up and Cool-down Performed as group-led instruction   Resistance Training Performed Yes   VAD Patient? No     Pain Assessment   Currently in Pain? No/denies   Multiple Pain Sites No      Capillary Blood Glucose: No results found for this or any previous visit (from the past 24 hour(s)).      Exercise Prescription Changes - 04/14/16 1500      Exercise Review   Progression Yes     Response to Exercise   Blood Pressure (Admit) 140/80   Blood Pressure (Exercise) 156/70   Blood Pressure (Exit) 104/68   Heart Rate (Admit) 71 bpm   Heart Rate (Exercise) 117 bpm   Heart Rate (Exit) 86 bpm   Oxygen Saturation (Admit) 96 %   Oxygen Saturation (Exercise) 89 %   Oxygen Saturation (Exit) 96 %   Rating of Perceived Exertion (Exercise) 12   Perceived Dyspnea (Exercise) 1   Duration Progress to 45 minutes of aerobic exercise without signs/symptoms of physical distress   Intensity THRR unchanged     Progression   Progression Continue to progress workloads to maintain intensity without signs/symptoms of physical distress.     Resistance Training   Training Prescription Yes   Weight blue bands   Reps 10-12  10 minutes of strength  training     Interval Training   Interval Training No     Treadmill   MPH 2.7   Grade 4   Minutes 17     Bike   Level 1.6   Minutes 17     Rower   Level 3   Minutes 17     Goals Met:  Exercise tolerated well No report of cardiac concerns or symptoms Strength training completed today  Goals Unmet:  Not Applicable  Comments: Service time is from 1330 to 1500    Dr. Rush Farmer is Medical Director for Pulmonary Rehab at [facility].

## 2016-04-16 ENCOUNTER — Encounter (HOSPITAL_COMMUNITY)
Admission: RE | Admit: 2016-04-16 | Discharge: 2016-04-16 | Disposition: A | Discharge status: Home / Self Care | Payer: Medicare Other | Source: Ambulatory Visit | Attending: Pulmonary Disease | Admitting: Pulmonary Disease

## 2016-04-16 VITALS — Wt 238.8 lb

## 2016-04-16 DIAGNOSIS — J84112 Idiopathic pulmonary fibrosis: Secondary | ICD-10-CM

## 2016-04-16 NOTE — Progress Notes (Signed)
Daily Session Note  Patient Details  Name: Douglas Lopez MRN: [id number] Date of Birth: 05-05-1946 Referring Provider:   April Manson Pulmonary Rehab Walk Test from 02/06/2016 in [place]  Referring Provider  Dr. Nelda Marseille      Encounter Date: 04/16/2016  Check In:     Session Check In - 04/16/16 1331      Check-In   Location [facility]   Staff Present Su Hilt, MS, ACSM RCEP, Exercise Physiologist;Joan Leonia Reeves, RN, BSN;Lucielle Vokes, RN;Portia Rollene Rotunda, RN, BSN   Supervising physician immediately available to respond to emergencies [facility] immediately available   Physician(s) Dr. Waldron Labs   Medication changes reported     No   Fall or balance concerns reported    No   Warm-up and Cool-down Performed as group-led Location manager Performed Yes   VAD Patient? No     Pain Assessment   Currently in Pain? No/denies   Multiple Pain Sites No      Capillary Blood Glucose: No results found for this or any previous visit (from the past 24 hour(s)).      Exercise Prescription Changes - 04/16/16 1500      Response to Exercise   Blood Pressure (Admit) 140/60   Blood Pressure (Exercise) 132/72   Blood Pressure (Exit) 136/64   Heart Rate (Admit) 69 bpm   Heart Rate (Exercise) 111 bpm   Heart Rate (Exit) 84 bpm   Oxygen Saturation (Admit) 95 %   Oxygen Saturation (Exercise) 90 %   Oxygen Saturation (Exit) 97 %   Rating of Perceived Exertion (Exercise) 11   Perceived Dyspnea (Exercise) 1   Duration Progress to 45 minutes of aerobic exercise without signs/symptoms of physical distress   Intensity THRR unchanged     Progression   Progression Continue to progress workloads to maintain intensity without signs/symptoms of physical distress.     Resistance Training   Training Prescription Yes   Weight blue bands   Reps 10-12  10 minutes of strength training     Interval Training   Interval  Training No     Treadmill   MPH 2.7   Grade 4   Minutes 17     Bike   Level 1.6   Minutes 17     Goals Met:  Exercise tolerated well No report of cardiac concerns or symptoms Strength training completed today  Goals Unmet:  Not Applicable  Comments: Service time is from 1330 to 1500    Dr. Rush Farmer is Medical Director for Pulmonary Rehab at [facility].

## 2016-04-16 NOTE — Progress Notes (Signed)
Pulmonary Individual Treatment Plan  Patient Details  Name: Douglas Lopez MRN: [id number] Date of Birth: [birth year] Referring Provider:   Doristine Devoid Pulmonary Rehab Walk Test from 02/06/2016 in [facility]  Referring Provider  Dr. Molli Knock      Initial Encounter Date:  Flowsheet Row Pulmonary Rehab Walk Test from 02/06/2016 in [facility] CARDI[facility] REHAB  Date  02/07/16  Referring Provider  Dr. Molli Knock      Visit Diagnosis: Idiopathic pulmonary fibrosis (HCC)  Patient's Home Medications on Admission:   Current Outpatient Prescriptions:  .  amLODipine (NORVASC) 2.5 MG tablet, Take 2.5 mg by mouth daily., Disp: , Rfl:  .  amlodipine-benazepril (LOTREL) 2.5-10 MG capsule, Take 1 capsule by mouth daily., Disp: , Rfl:  .  aspirin 81 MG tablet, Take 81 mg by mouth daily., Disp: , Rfl:  .  calcium carbonate (TUMS - DOSED IN MG ELEMENTAL CALCIUM) 500 MG chewable tablet, Chew 1 tablet by mouth daily., Disp: , Rfl:  .  cholecalciferol (VITAMIN D) 1000 units tablet, Take 1,000 Units by mouth daily., Disp: , Rfl:  .  levothyroxine (SYNTHROID, LEVOTHROID) 125 MCG tablet, Take 125 mcg by mouth daily before breakfast., Disp: , Rfl:  .  Multiple Vitamins-Minerals (MULTIVITAMIN WITH MINERALS) tablet, Take 1 tablet by mouth daily., Disp: , Rfl:  .  omeprazole (PRILOSEC) 20 MG capsule, Take 20 mg by mouth daily., Disp: , Rfl:  .  pravastatin (PRAVACHOL) 20 MG tablet, Take 20 mg by mouth daily., Disp: , Rfl:   Past Medical History: Past Medical History:  Diagnosis Date  . Hypertension   . Idiopathic pulmonary fibrosis (HCC)   . Right bundle branch block     Tobacco Use: History  Smoking Status  . Never Smoker  Smokeless Tobacco  . Never Used    Labs: Recent Review Flowsheet Data    There is no flowsheet data to display.      Capillary Blood Glucose: No results found for: GLUCAP   ADL UCSD:   Pulmonary Function Assessment:      Pulmonary Function Assessment - 01/20/16 1043      Breath   Bilateral Breath Sounds Clear   Shortness of Breath Yes      Exercise Target Goals:    Exercise Program Goal: Individual exercise prescription set with THRR, safety & activity barriers. Participant demonstrates ability to understand and report RPE using BORG scale, to self-measure pulse accurately, and to acknowledge the importance of the exercise prescription.  Exercise Prescription Goal: Starting with aerobic activity 30 plus minutes a day, 3 days per week for initial exercise prescription. Provide home exercise prescription and guidelines that participant acknowledges understanding prior to discharge.  Activity Barriers & Risk Stratification:     Activity Barriers & Cardiac Risk Stratification - 01/20/16 1041      Activity Barriers & Cardiac Risk Stratification   Activity Barriers Shortness of Breath;Deconditioning      6 Minute Walk:     6 Minute Walk    Row Name 02/07/16 0640         6 Minute Walk   Phase Initial     Distance 1720 feet     Walk Time 6 minutes     # of Rest Breaks 0     MPH 3.25     METS 3.45     RPE 12     Perceived Dyspnea  1     Symptoms No     Resting HR 93  bpm     Resting BP 132/80     Max Ex. HR 121 bpm     Max Ex. BP 140/84     2 Minute Post BP 144/70       Interval Oxygen   Interval Oxygen? Yes     Baseline Oxygen Saturation % 97 %     Baseline Liters of Oxygen 0 L     1 Minute Oxygen Saturation % 95 %     1 Minute Liters of Oxygen 0 L     2 Minute Oxygen Saturation % 91 %     2 Minute Liters of Oxygen 0 L     3 Minute Oxygen Saturation % 91 %     3 Minute Liters of Oxygen 0 L     4 Minute Oxygen Saturation % 92 %     4 Minute Liters of Oxygen 0 L     5 Minute Oxygen Saturation % 89 %     5 Minute Liters of Oxygen 0 L     6 Minute Oxygen Saturation % 92 %     6 Minute Liters of Oxygen 0 L     2 Minute Post Oxygen Saturation % 97 %     2 Minute Post Liters of  Oxygen 0 L        Initial Exercise Prescription:     Initial Exercise Prescription - 02/07/16 0600      Date of Initial Exercise RX and Referring Provider   Date 02/07/16   Referring Provider Dr. Molli Knock     Treadmill   MPH 1.7   Grade 0   Minutes 17     Bike   Level 0.5   Minutes 17     Rower   Level 1   Minutes 17     Prescription Details   Frequency (times per week) 2   Duration Progress to 45 minutes of aerobic exercise without signs/symptoms of physical distress     Intensity   THRR 40-80% of Max Heartrate 60-120   Ratings of Perceived Exertion 11-13   Perceived Dyspnea 0-4     Progression   Progression Continue progressive overload as per policy without signs/symptoms or physical distress.     Resistance Training   Training Prescription Yes   Weight blue band   Reps 10-12      Perform Capillary Blood Glucose checks as needed.  Exercise Prescription Changes:     Exercise Prescription Changes    Row Name 02/13/16 1600 02/18/16 1500 02/20/16 1600 02/25/16 1500 02/27/16 1500     Exercise Review   Progression Yes Yes Yes  - Yes     Response to Exercise   Blood Pressure (Admit) 142/72 134/60 124/68 126/74 134/70   Blood Pressure (Exercise) 160/84 158/82 154/84 150/70 150/80   Blood Pressure (Exit) 112/72 110/60 122/70 116/70 110/78   Heart Rate (Admit) 81 bpm 69 bpm 76 bpm 70 bpm 82 bpm   Heart Rate (Exercise) 99 bpm 126 bpm 102 bpm 110 bpm 113 bpm   Heart Rate (Exit) 67 bpm 95 bpm 86 bpm 85 bpm 87 bpm   Oxygen Saturation (Admit) 96 % 94 % 98 % 96 % 95 %   Oxygen Saturation (Exercise) 93 % 89 % 90 % 91 % 89 %   Oxygen Saturation (Exit) 95 % 94 % 96 % 96 % 95 %   Rating of Perceived Exertion (Exercise) 9 11 11 12 11    Perceived Dyspnea (Exercise) 1  2 1 1  0   Duration Progress to 45 minutes of aerobic exercise without signs/symptoms of physical distress Progress to 45 minutes of aerobic exercise without signs/symptoms of physical distress Progress to  45 minutes of aerobic exercise without signs/symptoms of physical distress Progress to 45 minutes of aerobic exercise without signs/symptoms of physical distress Progress to 45 minutes of aerobic exercise without signs/symptoms of physical distress   Intensity THRR unchanged THRR unchanged THRR unchanged THRR unchanged THRR unchanged     Progression   Progression Continue to progress workloads to maintain intensity without signs/symptoms of physical distress. Continue to progress workloads to maintain intensity without signs/symptoms of physical distress. Continue to progress workloads to maintain intensity without signs/symptoms of physical distress. Continue to progress workloads to maintain intensity without signs/symptoms of physical distress. Continue to progress workloads to maintain intensity without signs/symptoms of physical distress.     Resistance Training   Training Prescription Yes Yes Yes Yes Yes   Weight blue bands blue bands blue bands blue bands blue bands   Reps 10-12 10-12  10 minutes of strength training 10-12  10 minutes of strength training 10-12  10 minutes of strength training 10-12  10 minutes of strength training     Interval Training   Interval Training No No No No No     Treadmill   MPH 1.7 2.5  - 2.5 2.5   Grade 0 1  - 1 2   Minutes 17 17  - 17 17     Bike   Level  - 1.3 1.5 1.5 1.5   Minutes  - 17 17 17 17      Rower   Level 2 2 4 4   -   Minutes 17 17 17 17   -   Row Name 03/03/16 1500 03/03/16 1600 03/05/16 1500 03/10/16 1500 03/12/16 1600     Exercise Review   Progression  - Yes  -  -  -     Response to Exercise   Blood Pressure (Admit)  - 120/64 140/80 150/100 144/80   Blood Pressure (Exercise)  - 152/82 130/82 148/80 160/86   Blood Pressure (Exit)  - 120/60 124/80 140/84 128/68   Heart Rate (Admit)  - 83 bpm 68 bpm 79 bpm 75 bpm   Heart Rate (Exercise)  - 116 bpm 111 bpm 129 bpm 106 bpm   Heart Rate (Exit)  - 86 bpm 62 bpm 79 bpm 79 bpm    Oxygen Saturation (Admit)  - 94 % 95 % 97 % 96 %   Oxygen Saturation (Exercise)  - 90 % 89 % 85 %  increased to 88 quickly 91 %   Oxygen Saturation (Exit)  - 95 % 97 % 96 % 96 %   Rating of Perceived Exertion (Exercise)  - 11 11 11 11    Perceived Dyspnea (Exercise)  - 1 1 1 1    Duration  - Progress to 45 minutes of aerobic exercise without signs/symptoms of physical distress Progress to 45 minutes of aerobic exercise without signs/symptoms of physical distress Progress to 45 minutes of aerobic exercise without signs/symptoms of physical distress Progress to 45 minutes of aerobic exercise without signs/symptoms of physical distress   Intensity  - THRR unchanged THRR unchanged THRR unchanged THRR unchanged     Progression   Progression  - Continue to progress workloads to maintain intensity without signs/symptoms of physical distress. Continue to progress workloads to maintain intensity without signs/symptoms of physical distress. Continue to progress workloads to maintain  intensity without signs/symptoms of physical distress. Continue to progress workloads to maintain intensity without signs/symptoms of physical distress.     Resistance Training   Training Prescription  - Yes Yes Yes Yes   Weight  - blue bands blue bands blue bands blue bands   Reps  - 10-12  10 minutes of strength training 10-12  10 minutes of strength training -  10 minutes of strength training 10-12  10 minutes of strength training     Interval Training   Interval Training  - No No No No     Treadmill   MPH  - 2.5 2.5 2.5  -   Grade  - 2 2 2   -   Minutes  - 17 17 34  -     Bike   Level  - 1.5  -  - 1.5   Minutes  - 17  -  - 17     Rower   Level  - 3 3  - 3   Minutes  - 17 17  - 17     Home Exercise Plan   Plans to continue exercise at Home  -  -  -  -   Frequency Add 3 additional days to program exercise sessions.  -  -  -  -   Row Name 03/17/16 1551 03/24/16 1520 03/26/16 1500 03/31/16 1500 04/07/16 1500      Exercise Review   Progression  -  - Yes  -  -     Response to Exercise   Blood Pressure (Admit) 140/74 146/76 124/78 142/84 140/74   Blood Pressure (Exercise) 156/76 142/72 124/60 140/84 148/68   Blood Pressure (Exit) 126/82 122/76 124/76 118/60 108/68   Heart Rate (Admit) 77 bpm 82 bpm 82 bpm 77 bpm 77 bpm   Heart Rate (Exercise) 100 bpm 115 bpm 109 bpm 112 bpm 119 bpm   Heart Rate (Exit) 79 bpm 85 bpm 95 bpm 82 bpm 88 bpm   Oxygen Saturation (Admit) 97 % 96 % 96 % 95 % 96 %   Oxygen Saturation (Exercise) 85 % 90 % 91 % 92 % 88 %   Oxygen Saturation (Exit) 97 % 96 % 95 % 94 % 96 %   Rating of Perceived Exertion (Exercise) 12 12 12 11 12    Perceived Dyspnea (Exercise) 1 1 1 1 2    Duration Progress to 45 minutes of aerobic exercise without signs/symptoms of physical distress Progress to 45 minutes of aerobic exercise without signs/symptoms of physical distress Progress to 45 minutes of aerobic exercise without signs/symptoms of physical distress Progress to 45 minutes of aerobic exercise without signs/symptoms of physical distress Progress to 45 minutes of aerobic exercise without signs/symptoms of physical distress   Intensity THRR unchanged THRR unchanged THRR unchanged THRR unchanged THRR unchanged     Progression   Progression Continue to progress workloads to maintain intensity without signs/symptoms of physical distress. Continue to progress workloads to maintain intensity without signs/symptoms of physical distress. Continue to progress workloads to maintain intensity without signs/symptoms of physical distress. Continue to progress workloads to maintain intensity without signs/symptoms of physical distress. Continue to progress workloads to maintain intensity without signs/symptoms of physical distress.     Resistance Training   Training Prescription Yes Yes Yes Yes Yes   Weight blue bands blue bands blue bands blue bands blue bands   Reps 10-12  10 minutes of strength training  10-12  10 minutes of strength training 10-12  10  minutes of strength training 10-12  10 minutes of strength training 10-12  10 minutes of strength training     Interval Training   Interval Training No No No No No     Treadmill   MPH 2.5 2.5 2.7 2.7 2.7   Grade 2 2 4 4 4    Minutes 17 17 17 17 17      Bike   Level 1.5 1.5  - 1.5 1.5   Minutes 17 17  - 17 17     Rower   Level 3 3 3 3 3    Minutes 17 17 17 17 17    Row Name 04/09/16 1600 04/14/16 1500           Exercise Review   Progression  - Yes        Response to Exercise   Blood Pressure (Admit) 138/70 140/80      Blood Pressure (Exercise) 138/82 156/70      Blood Pressure (Exit) 122/76 104/68      Heart Rate (Admit) 88 bpm 71 bpm      Heart Rate (Exercise) 122 bpm 117 bpm      Heart Rate (Exit) 88 bpm 86 bpm      Oxygen Saturation (Admit) 96 % 96 %      Oxygen Saturation (Exercise) 88 % 89 %      Oxygen Saturation (Exit) 96 % 96 %      Rating of Perceived Exertion (Exercise) 12 12      Perceived Dyspnea (Exercise) 1 1      Duration Progress to 45 minutes of aerobic exercise without signs/symptoms of physical distress Progress to 45 minutes of aerobic exercise without signs/symptoms of physical distress      Intensity THRR unchanged THRR unchanged        Progression   Progression Continue to progress workloads to maintain intensity without signs/symptoms of physical distress. Continue to progress workloads to maintain intensity without signs/symptoms of physical distress.        Resistance Training   Training Prescription Yes Yes      Weight blue bands blue bands      Reps 10-12  10 minutes of strength training 10-12  10 minutes of strength training        Interval Training   Interval Training No No        Treadmill   MPH 2.7 2.7      Grade 4 4      Minutes 17 17        Bike   Level  - 1.6      Minutes  - 17        Rower   Level 3 3      Minutes 17 17         Exercise Comments:     Exercise  Comments    Row Name 02/13/16 1627 03/03/16 1536 03/12/16 0836 04/13/16 1356     Exercise Comments Today is first day of exercise, tolerated well. Home exercise prescription completed Patient is progressing well in program. We are keeping a close eye on oxygen saturations during exercise. Patient has reported oxygen saturations in the high 70's during exercise at home. The lowest we have seen here in pulmonary rehab is 85% on one occasion--most of the time 88%-91%. Will cont. to monitor and progress when appropriate. Patient isprogressing well in program. Patient was recording low o2 sats at home on his personal pulse oximeter. Problem was resolved with a new pulse  oximeter. Very motivated to make changes. Will cont. to monitor progression.       Discharge Exercise Prescription (Final Exercise Prescription Changes):     Exercise Prescription Changes - 04/14/16 1500      Exercise Review   Progression Yes     Response to Exercise   Blood Pressure (Admit) 140/80   Blood Pressure (Exercise) 156/70   Blood Pressure (Exit) 104/68   Heart Rate (Admit) 71 bpm   Heart Rate (Exercise) 117 bpm   Heart Rate (Exit) 86 bpm   Oxygen Saturation (Admit) 96 %   Oxygen Saturation (Exercise) 89 %   Oxygen Saturation (Exit) 96 %   Rating of Perceived Exertion (Exercise) 12   Perceived Dyspnea (Exercise) 1   Duration Progress to 45 minutes of aerobic exercise without signs/symptoms of physical distress   Intensity THRR unchanged     Progression   Progression Continue to progress workloads to maintain intensity without signs/symptoms of physical distress.     Resistance Training   Training Prescription Yes   Weight blue bands   Reps 10-12  10 minutes of strength training     Interval Training   Interval Training No     Treadmill   MPH 2.7   Grade 4   Minutes 17     Bike   Level 1.6   Minutes 17     Rower   Level 3   Minutes 17       Nutrition:  Target Goals: Understanding of  nutrition guidelines, daily intake of sodium 1500mg , cholesterol 200mg , calories 30% from fat and 7% or less from saturated fats, daily to have 5 or more servings of fruits and vegetables.  Biometrics:     Pre Biometrics - 01/20/16 1050      Pre Biometrics   Grip Strength 35 kg       Nutrition Therapy Plan and Nutrition Goals:     Nutrition Therapy & Goals - 03/05/16 1519      Nutrition Therapy   Diet Therapeutic Lifestyle Change     Personal Nutrition Goals   Personal Goal #1 1-2 lb wt loss per week to a wt loss goal of 6-24 lb at graduation from Pulmonary Rehab     Intervention Plan   Intervention Prescribe, educate and counsel regarding individualized specific dietary modifications aiming towards targeted core components such as weight, hypertension, lipid management, diabetes, heart failure and other comorbidities.   Expected Outcomes Short Term Goal: Understand basic principles of dietary content, such as calories, fat, sodium, cholesterol and nutrients.;Long Term Goal: Adherence to prescribed nutrition plan.      Nutrition Discharge: Rate Your Plate Scores:     Nutrition Assessments - 03/05/16 1518      Rate Your Plate Scores   Pre Score 49      Psychosocial: Target Goals: Acknowledge presence or absence of depression, maximize coping skills, provide positive support system. Participant is able to verbalize types and ability to use techniques and skills needed for reducing stress and depression.  Initial Review & Psychosocial Screening:     Initial Psych Review & Screening - 01/20/16 1103      Initial Review   Current issues with --  none identified     Family Dynamics   Good Support System? Yes   Concerns --  none identified     Barriers   Psychosocial barriers to participate in program There are no identifiable barriers or psychosocial needs.     Screening Interventions  Interventions Encouraged to exercise      Quality of Life  Scores:   PHQ-9: Recent Review Flowsheet Data    Depression screen [facility] 2/9 01/20/2016   Decreased Interest 0   Down, Depressed, Hopeless 0   PHQ - 2 Score 0      Psychosocial Evaluation and Intervention:     Psychosocial Evaluation - 01/20/16 1104      Psychosocial Evaluation & Interventions   Interventions --  none   Comments none required   Continued Psychosocial Services Needed No      Psychosocial Re-Evaluation:     Psychosocial Re-Evaluation    Row Name 03/10/16 0844 04/09/16 0845           Psychosocial Re-Evaluation   Interventions Encouraged to attend Pulmonary Rehabilitation for the exercise Encouraged to attend Pulmonary Rehabilitation for the exercise      Comments -  no psychosocial concerns identified None identified at this time      Continued Psychosocial Services Needed No No        Education: Education Goals: Education classes will be provided on a weekly basis, covering required topics. Participant will state understanding/return demonstration of topics presented.  Learning Barriers/Preferences:     Learning Barriers/Preferences - 01/20/16 1042      Learning Barriers/Preferences   Learning Barriers None   Learning Preferences Written Material;Video;Verbal Instruction;Skilled Demonstration;Pictoral;Individual Instruction;Group Instruction;Computer/Internet;Audio      Education Topics: Risk Factor Reduction:  -Group instruction that is supported by a PowerPoint presentation. Instructor discusses the definition of a risk factor, different risk factors for pulmonary disease, and how the heart and lungs work together.     Nutrition for Pulmonary Patient:  -Group instruction provided by PowerPoint slides, verbal discussion, and written materials to support subject matter. The instructor gives an explanation and review of healthy diet recommendations, which includes a discussion on weight management, recommendations for fruit and vegetable  consumption, as well as protein, fluid, caffeine, fiber, sodium, sugar, and alcohol. Tips for eating when patients are short of breath are discussed. Flowsheet Row PULMONARY REHAB OTHER RESPIRATORY from 04/09/2016 in [facility] CARDIAC REHAB  Date  03/12/16  Educator  RD  Instruction Review Code  2- meets goals/outcomes      Pursed Lip Breathing:  -Group instruction that is supported by demonstration and informational handouts. Instructor discusses the benefits of pursed lip and diaphragmatic breathing and detailed demonstration on how to preform both.   Flowsheet Row PULMONARY REHAB OTHER RESPIRATORY from 04/09/2016 in [facility] CARDIAC REHAB  Date  04/09/16  Educator  RT  Instruction Review Code  2- meets goals/outcomes      Oxygen Safety:  -Group instruction provided by PowerPoint, verbal discussion, and written material to support subject matter. There is an overview of "What is Oxygen" and "Why do we need it".  Instructor also reviews how to create a safe environment for oxygen use, the importance of using oxygen as prescribed, and the risks of noncompliance. There is a brief discussion on traveling with oxygen and resources the patient may utilize. Flowsheet Row PULMONARY REHAB OTHER RESPIRATORY from 04/09/2016 in [facility] CARDIAC REHAB  Date  03/26/16  Educator  rn  Instruction Review Code  2- meets goals/outcomes      Oxygen Equipment:  -Group instruction provided by [employer] Staff utilizing handouts, written materials, and equipment demonstrations.   Signs and Symptoms:  -Group instruction provided by written material and verbal discussion to support subject matter. Warning  signs and symptoms of infection, stroke, and heart attack are reviewed and when to call the physician/911 reinforced. Tips for preventing the spread of infection discussed. Flowsheet Row PULMONARY REHAB OTHER RESPIRATORY from 04/09/2016 in [facility] CARDIAC REHAB  Date  02/13/16  Educator  RN  Instruction Review Code  2- meets goals/outcomes      Advanced Directives:  -Group instruction provided by verbal instruction and written material to support subject matter. Instructor reviews Advanced Directive laws and proper instruction for filling out document.   Pulmonary Video:  -Group video education that reviews the importance of medication and oxygen compliance, exercise, good nutrition, pulmonary hygiene, and pursed lip and diaphragmatic breathing for the pulmonary patient.   Exercise for the Pulmonary Patient:  -Group instruction that is supported by a PowerPoint presentation. Instructor discusses benefits of exercise, core components of exercise, frequency, duration, and intensity of an exercise routine, importance of utilizing pulse oximetry during exercise, safety while exercising, and options of places to exercise outside of rehab.     Pulmonary Medications:  -Verbally interactive group education provided by instructor with focus on inhaled medications and proper administration.   Anatomy and Physiology of the Respiratory System and Intimacy:  -Group instruction provided by PowerPoint, verbal discussion, and written material to support subject matter. Instructor reviews respiratory cycle and anatomical components of the respiratory system and their functions. Instructor also reviews differences in obstructive and restrictive respiratory diseases with examples of each. Intimacy, Sex, and Sexuality differences are reviewed with a discussion on how relationships can change when diagnosed with pulmonary disease. Common sexual concerns are reviewed. Flowsheet Row PULMONARY REHAB OTHER RESPIRATORY from 04/09/2016 in [facility] CARDIAC REHAB  Date  02/20/16  Educator  RN  Instruction Review Code  2- meets goals/outcomes      Knowledge Questionnaire Score:   Core Components/Risk Factors/Patient  Goals at Admission:     Personal Goals and Risk Factors at Admission - 01/20/16 1053      Core Components/Risk Factors/Patient Goals on Admission    Weight Management Obesity   Increase Strength and Stamina Yes   Intervention Provide advice, education, support and counseling about physical activity/exercise needs.;Develop an individualized exercise prescription for aerobic and resistive training based on initial evaluation findings, risk stratification, comorbidities and participant's personal goals.   Expected Outcomes Achievement of increased cardiorespiratory fitness and enhanced flexibility, muscular endurance and strength shown through measurements of functional capacity and personal statement of participant.   Improve shortness of breath with ADL's Yes   Intervention Provide education, individualized exercise plan and daily activity instruction to help decrease symptoms of SOB with activities of daily living.   Expected Outcomes Short Term: Achieves a reduction of symptoms when performing activities of daily living.      Core Components/Risk Factors/Patient Goals Review:      Goals and Risk Factor Review    Row Name 01/20/16 1101 02/13/16 1629 03/10/16 0842 04/09/16 0843       Core Components/Risk Factors/Patient Goals Review   Personal Goals Review Weight Management/Obesity;Increase Strength and Stamina;Improve shortness of breath with ADL's Weight Management/Obesity;Increase Strength and Stamina;Improve shortness of breath with ADL's  - Weight Management/Obesity;Improve shortness of breath with ADL's;Increase Strength and Stamina;Develop more efficient breathing techniques such as purse lipped breathing and diaphragmatic breathing and practicing self-pacing with activity.    Review To increase strength and stamina to improve shortness of breath, and lose weight to improve breathing First day of exercise, tolerated well. has lost 1.5kg,  very strong, tolerating workloads increases well  has lost 3 kg since starting program, blood pressure is controlled, workloads are increasing, progressing well    Expected Outcomes Improved strength and stamina with also weight loss. Should see progress in next 30 days, will progress as tolerated. continue to increase workloads as tolerated to increase strength and stamina Continue to increase workloads as tolerated       Core Components/Risk Factors/Patient Goals at Discharge (Final Review):      Goals and Risk Factor Review - 04/09/16 0843      Core Components/Risk Factors/Patient Goals Review   Personal Goals Review Weight Management/Obesity;Improve shortness of breath with ADL's;Increase Strength and Stamina;Develop more efficient breathing techniques such as purse lipped breathing and diaphragmatic breathing and practicing self-pacing with activity.   Review has lost 3 kg since starting program, blood pressure is controlled, workloads are increasing, progressing well   Expected Outcomes Continue to increase workloads as tolerated      ITP Comments:   Comments: ITP REVIEW Pt is making expected progress toward pulmonary rehab goals after completing 16 sessions. Recommend continued exercise, life style modification, education, and utilization of breathing techniques to increase stamina and strength and decrease shortness of breath with exertion.

## 2016-04-21 ENCOUNTER — Encounter (HOSPITAL_COMMUNITY)
Admission: RE | Admit: 2016-04-21 | Discharge: 2016-04-21 | Disposition: A | Discharge status: Home / Self Care | Payer: Medicare Other | Source: Ambulatory Visit | Attending: Pulmonary Disease | Admitting: Pulmonary Disease

## 2016-04-21 VITALS — Wt 240.1 lb

## 2016-04-21 DIAGNOSIS — J84112 Idiopathic pulmonary fibrosis: Secondary | ICD-10-CM | POA: Diagnosis not present

## 2016-04-21 NOTE — Progress Notes (Signed)
Daily Session Note  Patient Details  Name: Douglas Lopez MRN: [id number] Date of Birth: 1946/07/10 Referring Provider:   April Manson Pulmonary Rehab Walk Test from 02/06/2016 in [place]  Referring Provider  Dr. Nelda Marseille      Encounter Date: 04/21/2016  Check In:     Session Check In - 04/21/16 1355      Check-In   Location [facility]   Staff Present Rosebud Poles, RN, BSN;Molly diVincenzo, MS, ACSM RCEP, Exercise Physiologist;Delsin Copen Ysidro Evert, RN;Portia Rollene Rotunda, RN, BSN   Supervising physician immediately available to respond to emergencies [facility] immediately available   Physician(s) Dr. Waldron Labs   Medication changes reported     No   Fall or balance concerns reported    No   Warm-up and Cool-down Performed as group-led instruction   Resistance Training Performed Yes   VAD Patient? No     Pain Assessment   Currently in Pain? No/denies   Multiple Pain Sites No      Capillary Blood Glucose: No results found for this or any previous visit (from the past 24 hour(s)).      Exercise Prescription Changes - 04/21/16 1500      Response to Exercise   Blood Pressure (Admit) 132/50   Blood Pressure (Exercise) 130/64   Blood Pressure (Exit) 114/60   Heart Rate (Admit) 80 bpm   Heart Rate (Exercise) 116 bpm   Heart Rate (Exit) 94 bpm   Oxygen Saturation (Admit) 95 %   Oxygen Saturation (Exercise) 90 %   Oxygen Saturation (Exit) 94 %   Rating of Perceived Exertion (Exercise) 12   Perceived Dyspnea (Exercise) 2   Duration Progress to 45 minutes of aerobic exercise without signs/symptoms of physical distress   Intensity THRR unchanged     Progression   Progression Continue to progress workloads to maintain intensity without signs/symptoms of physical distress.     Resistance Training   Training Prescription Yes   Weight blue bands   Reps 10-12  10 minutes of strength training     Interval Training   Interval  Training No     Treadmill   MPH 2.7   Grade 4   Minutes 17     Bike   Level 1.6   Minutes 17     Rower   Level 3   Minutes 17     Goals Met:  Exercise tolerated well No report of cardiac concerns or symptoms Strength training completed today  Goals Unmet:  Not Applicable  Comments: Service time is from 1330 to 1500    Dr. Rush Farmer is Medical Director for Pulmonary Rehab at [facility].

## 2016-04-23 ENCOUNTER — Encounter (HOSPITAL_COMMUNITY)
Admission: RE | Admit: 2016-04-23 | Discharge: 2016-04-23 | Disposition: A | Discharge status: Home / Self Care | Payer: Medicare Other | Source: Ambulatory Visit | Attending: Pulmonary Disease | Admitting: Pulmonary Disease

## 2016-04-23 VITALS — Wt 240.1 lb

## 2016-04-23 DIAGNOSIS — J84112 Idiopathic pulmonary fibrosis: Secondary | ICD-10-CM | POA: Diagnosis not present

## 2016-04-23 NOTE — Progress Notes (Signed)
Daily Session Note  Patient Details  Name: Douglas Lopez MRN: [id number] Date of Birth: [birth year] Referring Provider:   April Manson Pulmonary Rehab Walk Test from 02/06/2016 in [place]  Referring Provider  Dr. Nelda Marseille      Encounter Date: 04/23/2016  Check In:     Session Check In - 04/23/16 1330      Check-In   Location [facility]   Staff Present Rosebud Poles, RN, BSN;Molly diVincenzo, MS, ACSM RCEP, Exercise Physiologist;Lisa Ysidro Evert, Felipe Drone, RN, MHA;Portia Rollene Rotunda, RN, BSN   Supervising physician immediately available to respond to emergencies [facility] immediately available   Physician(s) Dr. Dyann Kief   Medication changes reported     No   Fall or balance concerns reported    No   Warm-up and Cool-down Performed as group-led instruction   Resistance Training Performed Yes   VAD Patient? No     Pain Assessment   Currently in Pain? No/denies   Multiple Pain Sites No      Capillary Blood Glucose: No results found for this or any previous visit (from the past 24 hour(s)).      Exercise Prescription Changes - 04/23/16 1600      Response to Exercise   Blood Pressure (Admit) 132/70   Blood Pressure (Exercise) 146/80   Blood Pressure (Exit) 122/70   Heart Rate (Admit) 90 bpm   Heart Rate (Exercise) 116 bpm   Heart Rate (Exit) 88 bpm   Oxygen Saturation (Admit) 96 %   Oxygen Saturation (Exercise) 88 %   Oxygen Saturation (Exit) 95 %   Rating of Perceived Exertion (Exercise) 12   Perceived Dyspnea (Exercise) 2   Duration Progress to 45 minutes of aerobic exercise without signs/symptoms of physical distress   Intensity THRR unchanged     Progression   Progression Continue to progress workloads to maintain intensity without signs/symptoms of physical distress.     Resistance Training   Training Prescription Yes   Weight blue bands   Reps 10-12  10 minutes of strength training     Interval  Training   Interval Training No     Treadmill   MPH 2.7   Grade 4   Minutes 17     Bike   Level 1.6   Minutes 17     Goals Met:  Exercise tolerated well Strength training completed today  Goals Unmet:  Not Applicable  Comments: Service time is from 1330 to 1515    Dr. Rush Farmer is Medical Director for Pulmonary Rehab at [facility]

## 2016-04-28 ENCOUNTER — Encounter (HOSPITAL_COMMUNITY)
Admission: RE | Admit: 2016-04-28 | Discharge: 2016-04-28 | Disposition: A | Discharge status: Home / Self Care | Payer: Medicare Other | Source: Ambulatory Visit | Attending: Pulmonary Disease | Admitting: Pulmonary Disease

## 2016-04-28 VITALS — Wt 240.1 lb

## 2016-04-28 DIAGNOSIS — J84112 Idiopathic pulmonary fibrosis: Secondary | ICD-10-CM | POA: Diagnosis not present

## 2016-04-28 NOTE — Progress Notes (Signed)
Daily Session Note  Patient Details  Name: Douglas Lopez MRN: [id number] Date of Birth: 13-Jan-1946 Referring Provider:   April Manson Pulmonary Rehab Walk Test from 02/06/2016 in [place]  Referring Provider  Dr. Nelda Marseille      Encounter Date: 04/28/2016  Check In:     Session Check In - 04/28/16 1334      Check-In   Location [facility]   Staff Present Rosebud Poles, RN, Luisa Hart, RN, BSN;Lisa Ysidro Evert, RN;Molly diVincenzo, MS, ACSM RCEP, Exercise Physiologist   Supervising physician immediately available to respond to emergencies [facility] immediately available   Physician(s) Dr. Dyann Kief   Medication changes reported     No   Fall or balance concerns reported    No   Warm-up and Cool-down Performed as group-led instruction   Resistance Training Performed Yes   VAD Patient? No     Pain Assessment   Currently in Pain? No/denies   Multiple Pain Sites No      Capillary Blood Glucose: No results found for this or any previous visit (from the past 24 hour(s)).      Exercise Prescription Changes - 04/28/16 1525      Response to Exercise   Blood Pressure (Admit) 142/72   Blood Pressure (Exercise) 158/66   Blood Pressure (Exit) 108/64   Heart Rate (Admit) 85 bpm   Heart Rate (Exercise) 118 bpm   Heart Rate (Exit) 97 bpm   Oxygen Saturation (Admit) 96 %   Oxygen Saturation (Exercise) 90 %   Oxygen Saturation (Exit) 94 %   Rating of Perceived Exertion (Exercise) 15   Perceived Dyspnea (Exercise) 3   Duration Progress to 45 minutes of aerobic exercise without signs/symptoms of physical distress   Intensity THRR unchanged     Progression   Progression Continue to progress workloads to maintain intensity without signs/symptoms of physical distress.     Resistance Training   Training Prescription Yes   Weight blue bands   Reps 10-12  10 minutes of strength training     Interval Training   Interval Training  No     Treadmill   Minutes 17     Bike   Level 1.7   Minutes 17     Goals Met:  Improved SOB with ADL's Using PLB without cueing & demonstrates good technique Exercise tolerated well Personal goals reviewed No report of cardiac concerns or symptoms Strength training completed today  Goals Unmet:  Not Applicable  Comments: Service time is from 1330 to 1500   Dr. Rush Farmer is Medical Director for Pulmonary Rehab at [facility].

## 2016-04-30 ENCOUNTER — Encounter (HOSPITAL_COMMUNITY)
Admission: RE | Admit: 2016-04-30 | Discharge: 2016-04-30 | Disposition: A | Discharge status: Home / Self Care | Payer: Medicare Other | Source: Ambulatory Visit | Attending: Pulmonary Disease | Admitting: Pulmonary Disease

## 2016-04-30 VITALS — Wt 241.2 lb

## 2016-04-30 DIAGNOSIS — J84112 Idiopathic pulmonary fibrosis: Secondary | ICD-10-CM | POA: Diagnosis not present

## 2016-04-30 NOTE — Progress Notes (Signed)
Daily Session Note  Patient Details  Name: Douglas Lopez MRN: [id number] Date of Birth: Nov 01, 1945 Referring Provider:   April Manson Pulmonary Rehab Walk Test from 02/06/2016 in [place]  Referring Provider  Dr. Nelda Marseille      Encounter Date: 04/30/2016  Check In:     Session Check In - 04/30/16 1340      Check-In   Location [facility]   Staff Present Rosebud Poles, RN, BSN;Molly diVincenzo, MS, ACSM RCEP, Exercise Physiologist;Jamail Cullers Ysidro Evert, RN;Portia Rollene Rotunda, RN, BSN   Supervising physician immediately available to respond to emergencies [facility] immediately available   Physician(s) Dr. Cathlean Sauer   Medication changes reported     No   Fall or balance concerns reported    No   Warm-up and Cool-down Performed as group-led instruction   Resistance Training Performed Yes   VAD Patient? No     Pain Assessment   Currently in Pain? No/denies   Multiple Pain Sites No      Capillary Blood Glucose: No results found for this or any previous visit (from the past 24 hour(s)).      Exercise Prescription Changes - 04/30/16 1500      Response to Exercise   Blood Pressure (Admit) 104/60   Blood Pressure (Exercise) 160/60   Blood Pressure (Exit) 128/62   Heart Rate (Admit) 79 bpm   Heart Rate (Exercise) 116 bpm   Heart Rate (Exit) 87 bpm   Oxygen Saturation (Admit) 95 %   Oxygen Saturation (Exercise) 89 %   Oxygen Saturation (Exit) 95 %   Rating of Perceived Exertion (Exercise) 13   Perceived Dyspnea (Exercise) 2   Duration Progress to 45 minutes of aerobic exercise without signs/symptoms of physical distress   Intensity THRR unchanged     Progression   Progression Continue to progress workloads to maintain intensity without signs/symptoms of physical distress.     Resistance Training   Training Prescription Yes   Weight blue bands   Reps 10-12  10 minutes of strength training     Interval Training   Interval Training  No     Elliptical   Level 2   Minutes 17     Rower   Level 3   Minutes 17     Goals Met:  Exercise tolerated well No report of cardiac concerns or symptoms Strength training completed today  Goals Unmet:  Not Applicable  Comments: Service time is from 1330 to 1515    Dr. Rush Farmer is Medical Director for Pulmonary Rehab at [facility].

## 2016-05-05 ENCOUNTER — Encounter (HOSPITAL_COMMUNITY)
Admission: RE | Admit: 2016-05-05 | Discharge: 2016-05-05 | Disposition: A | Discharge status: Home / Self Care | Payer: Medicare Other | Source: Ambulatory Visit | Attending: Pulmonary Disease | Admitting: Pulmonary Disease

## 2016-05-05 VITALS — Wt 240.5 lb

## 2016-05-05 DIAGNOSIS — J84112 Idiopathic pulmonary fibrosis: Secondary | ICD-10-CM | POA: Diagnosis not present

## 2016-05-05 NOTE — Progress Notes (Signed)
Daily Session Note  Patient Details  Name: Douglas Lopez MRN: [id number] Date of Birth: Mar 28, 1946 Referring Provider:   April Manson Pulmonary Rehab Walk Test from 02/06/2016 in [place]  Referring Provider  Dr. Nelda Marseille      Encounter Date: 05/05/2016  Check In:     Session Check In - 05/05/16 1411      Check-In   Location [facility]   Staff Present Rosebud Poles, RN, BSN;Molly diVincenzo, MS, ACSM RCEP, Exercise Physiologist;Lisa Ysidro Evert, RN;Portia Rollene Rotunda, RN, BSN   Supervising physician immediately available to respond to emergencies [facility] immediately available   Physician(s) Dr. Maylene Roes   Medication changes reported     No   Fall or balance concerns reported    No   Warm-up and Cool-down Performed as group-led instruction   Resistance Training Performed Yes   VAD Patient? No     Pain Assessment   Currently in Pain? No/denies   Multiple Pain Sites No      Capillary Blood Glucose: No results found for this or any previous visit (from the past 24 hour(s)).      Exercise Prescription Changes - 05/05/16 1600      Response to Exercise   Blood Pressure (Admit) 130/60   Blood Pressure (Exercise) 160/70   Blood Pressure (Exit) 130/62   Heart Rate (Admit) 61 bpm   Heart Rate (Exercise) 120 bpm   Heart Rate (Exit) 64 bpm   Oxygen Saturation (Admit) 97 %   Oxygen Saturation (Exercise) 91 %   Oxygen Saturation (Exit) 95 %   Rating of Perceived Exertion (Exercise) 13   Perceived Dyspnea (Exercise) 2   Duration Progress to 45 minutes of aerobic exercise without signs/symptoms of physical distress   Intensity THRR unchanged     Progression   Progression Continue to progress workloads to maintain intensity without signs/symptoms of physical distress.     Resistance Training   Training Prescription Yes   Weight blue bands   Reps 10-12  10 minutes of strength training     Interval Training   Interval Training No      Bike   Level 1.7   Minutes 17     Elliptical   Level 2   Minutes 17     Rower   Level 3   Minutes 17     Goals Met:  Exercise tolerated well Strength training completed today  Goals Unmet:  Not Applicable  Comments: Service time is from 1330 to 1515    Dr. Rush Farmer is Medical Director for Pulmonary Rehab at [facility].

## 2016-05-07 ENCOUNTER — Encounter (HOSPITAL_COMMUNITY)
Admission: RE | Admit: 2016-05-07 | Discharge: 2016-05-07 | Disposition: A | Discharge status: Home / Self Care | Payer: Medicare Other | Source: Ambulatory Visit | Attending: Pulmonary Disease | Admitting: Pulmonary Disease

## 2016-05-07 VITALS — Wt 241.6 lb

## 2016-05-07 DIAGNOSIS — J84112 Idiopathic pulmonary fibrosis: Secondary | ICD-10-CM

## 2016-05-07 NOTE — Progress Notes (Signed)
Daily Session Note  Patient Details  Name: Douglas Lopez MRN: [id number] Date of Birth: 06/14/1946 Referring Provider:   Flowsheet Row Pulmonary Rehab Walk Test from 02/06/2016 in [facility]  Referring Provider  Dr. Yacoub      Encounter Date: 05/07/2016  Check In:     Session Check In - 05/07/16 1357      Check-In   Location [facility]   Staff Present Joan Behrens, RN, BSN;Molly diVincenzo, MS, ACSM RCEP, Exercise Physiologist;Lisa Hughes, RN;Portia Payne, RN, BSN   Supervising physician immediately available to respond to emergencies [facility] immediately available   Physician(s) Dr. Krishnan   Medication changes reported     No   Fall or balance concerns reported    No   Warm-up and Cool-down Performed as group-led instruction   Resistance Training Performed Yes   VAD Patient? No     Pain Assessment   Currently in Pain? No/denies   Multiple Pain Sites No      Capillary Blood Glucose: No results found for this or any previous visit (from the past 24 hour(s)).      Exercise Prescription Changes - 05/07/16 1601      Response to Exercise   Blood Pressure (Admit) 130/80   Blood Pressure (Exercise) 164/80   Blood Pressure (Exit) 130/70   Heart Rate (Admit) 114 bpm   Heart Rate (Exercise) 126 bpm   Heart Rate (Exit) 75 bpm   Oxygen Saturation (Admit) 96 %   Oxygen Saturation (Exercise) 89 %   Oxygen Saturation (Exit) 94 %   Rating of Perceived Exertion (Exercise) 13   Perceived Dyspnea (Exercise) 1   Duration Progress to 45 minutes of aerobic exercise without signs/symptoms of physical distress   Intensity THRR unchanged     Progression   Progression Continue to progress workloads to maintain intensity without signs/symptoms of physical distress.     Resistance Training   Training Prescription Yes   Weight blue bands   Reps 10-12  10 minutes of strength training     Interval Training   Interval  Training No     Bike   Level 1.7   Minutes 17     Elliptical   Level 2   Minutes 17     Rower   Level 3   Minutes 17     Goals Met:  Exercise tolerated well Strength training completed today  Goals Unmet:  Not Applicable  Comments: Service time is from 1330 to 1515    Dr. Wesam G. Yacoub is Medical Director for Pulmonary Rehab at [facility]. 

## 2016-05-12 ENCOUNTER — Encounter (HOSPITAL_COMMUNITY)
Admission: RE | Admit: 2016-05-12 | Discharge: 2016-05-12 | Disposition: A | Discharge status: Home / Self Care | Payer: Medicare Other | Source: Ambulatory Visit | Attending: Pulmonary Disease | Admitting: Pulmonary Disease

## 2016-05-12 DIAGNOSIS — J84112 Idiopathic pulmonary fibrosis: Secondary | ICD-10-CM

## 2016-05-12 NOTE — Progress Notes (Signed)
Pulmonary Individual Treatment Plan  Patient Details  Name: Douglas Lopez MRN: [id number] Date of Birth: [birth year] Referring Provider:   April Manson Pulmonary Rehab Walk Test from 02/06/2016 in [place]  Referring Provider  Dr. Nelda Marseille      Initial Encounter Date:  Flowsheet Row Pulmonary Rehab Walk Test from 02/06/2016 in [place]  Date  02/07/16  Referring Provider  Dr. Nelda Marseille      Visit Diagnosis: Idiopathic pulmonary fibrosis ([place])  Patient's Home Medications on Admission:   Current Outpatient Prescriptions:  .  amLODipine (NORVASC) 2.5 MG tablet, Take 2.5 mg by mouth daily., Disp: , Rfl:  .  amlodipine-benazepril (LOTREL) 2.5-10 MG capsule, Take 1 capsule by mouth daily., Disp: , Rfl:  .  aspirin 81 MG tablet, Take 81 mg by mouth daily., Disp: , Rfl:  .  calcium carbonate (TUMS - DOSED IN MG ELEMENTAL CALCIUM) 500 MG chewable tablet, Chew 1 tablet by mouth daily., Disp: , Rfl:  .  cholecalciferol (VITAMIN D) 1000 units tablet, Take 1,000 Units by mouth daily., Disp: , Rfl:  .  levothyroxine (SYNTHROID, LEVOTHROID) 125 MCG tablet, Take 125 mcg by mouth daily before breakfast., Disp: , Rfl:  .  Multiple Vitamins-Minerals (MULTIVITAMIN WITH MINERALS) tablet, Take 1 tablet by mouth daily., Disp: , Rfl:  .  omeprazole (PRILOSEC) 20 MG capsule, Take 20 mg by mouth daily., Disp: , Rfl:  .  pravastatin (PRAVACHOL) 20 MG tablet, Take 20 mg by mouth daily., Disp: , Rfl:   Past Medical History: Past Medical History:  Diagnosis Date  . Hypertension   . Idiopathic pulmonary fibrosis ([place])   . Right bundle branch block     Tobacco Use: History  Smoking Status  . Never Smoker  Smokeless Tobacco  . Never Used    Labs: Recent Review Flowsheet Data    There is no flowsheet data to display.      Capillary Blood Glucose: No results found for: GLUCAP   ADL UCSD:     Pulmonary Assessment Scores    Row Name  04/30/16 1532         ADL UCSD   ADL Phase Exit     SOB Score total 43        Pulmonary Function Assessment:     Pulmonary Function Assessment - 01/20/16 1043      Breath   Bilateral Breath Sounds Clear   Shortness of Breath Yes      Exercise Target Goals:    Exercise Program Goal: Individual exercise prescription set with THRR, safety & activity barriers. Participant demonstrates ability to understand and report RPE using BORG scale, to self-measure pulse accurately, and to acknowledge the importance of the exercise prescription.  Exercise Prescription Goal: Starting with aerobic activity 30 plus minutes a day, 3 days per week for initial exercise prescription. Provide home exercise prescription and guidelines that participant acknowledges understanding prior to discharge.  Activity Barriers & Risk Stratification:     Activity Barriers & Cardiac Risk Stratification - 01/20/16 1041      Activity Barriers & Cardiac Risk Stratification   Activity Barriers Shortness of Breath;Deconditioning      6 Minute Walk:     6 Minute Walk    Row Name 02/07/16 0640         6 Minute Walk   Phase Initial     Distance 1720 feet     Walk Time 6 minutes     # of  Rest Breaks 0     MPH 3.25     METS 3.45     RPE 12     Perceived Dyspnea  1     Symptoms No     Resting HR 93 bpm     Resting BP 132/80     Max Ex. HR 121 bpm     Max Ex. BP 140/84     2 Minute Post BP 144/70       Interval Oxygen   Interval Oxygen? Yes     Baseline Oxygen Saturation % 97 %     Baseline Liters of Oxygen 0 L     1 Minute Oxygen Saturation % 95 %     1 Minute Liters of Oxygen 0 L     2 Minute Oxygen Saturation % 91 %     2 Minute Liters of Oxygen 0 L     3 Minute Oxygen Saturation % 91 %     3 Minute Liters of Oxygen 0 L     4 Minute Oxygen Saturation % 92 %     4 Minute Liters of Oxygen 0 L     5 Minute Oxygen Saturation % 89 %     5 Minute Liters of Oxygen 0 L     6 Minute Oxygen  Saturation % 92 %     6 Minute Liters of Oxygen 0 L     2 Minute Post Oxygen Saturation % 97 %     2 Minute Post Liters of Oxygen 0 L        Initial Exercise Prescription:     Initial Exercise Prescription - 02/07/16 0600      Date of Initial Exercise RX and Referring Provider   Date 02/07/16   Referring Provider Dr. Nelda Marseille     Treadmill   MPH 1.7   Grade 0   Minutes 17     Bike   Level 0.5   Minutes 17     Rower   Level 1   Minutes 17     Prescription Details   Frequency (times per week) 2   Duration Progress to 45 minutes of aerobic exercise without signs/symptoms of physical distress     Intensity   THRR 40-80% of Max Heartrate 60-120   Ratings of Perceived Exertion 11-13   Perceived Dyspnea 0-4     Progression   Progression Continue progressive overload as per policy without signs/symptoms or physical distress.     Resistance Training   Training Prescription Yes   Weight blue band   Reps 10-12      Perform Capillary Blood Glucose checks as needed.  Exercise Prescription Changes:     Exercise Prescription Changes    Row Name 02/13/16 1600 02/18/16 1500 02/20/16 1600 02/25/16 1500 02/27/16 1500     Exercise Review   Progression Yes Yes Yes  - Yes     Response to Exercise   Blood Pressure (Admit) 142/72 134/60 124/68 126/74 134/70   Blood Pressure (Exercise) 160/84 158/82 154/84 150/70 150/80   Blood Pressure (Exit) 112/72 110/60 122/70 116/70 110/78   Heart Rate (Admit) 81 bpm 69 bpm 76 bpm 70 bpm 82 bpm   Heart Rate (Exercise) 99 bpm 126 bpm 102 bpm 110 bpm 113 bpm   Heart Rate (Exit) 67 bpm 95 bpm 86 bpm 85 bpm 87 bpm   Oxygen Saturation (Admit) 96 % 94 % 98 % 96 % 95 %   Oxygen Saturation (Exercise) 93 %  89 % 90 % 91 % 89 %   Oxygen Saturation (Exit) 95 % 94 % 96 % 96 % 95 %   Rating of Perceived Exertion (Exercise) 9 11 11 12 11    Perceived Dyspnea (Exercise) 1 2 1 1  0   Duration Progress to 45 minutes of aerobic exercise without  signs/symptoms of physical distress Progress to 45 minutes of aerobic exercise without signs/symptoms of physical distress Progress to 45 minutes of aerobic exercise without signs/symptoms of physical distress Progress to 45 minutes of aerobic exercise without signs/symptoms of physical distress Progress to 45 minutes of aerobic exercise without signs/symptoms of physical distress   Intensity THRR unchanged THRR unchanged THRR unchanged THRR unchanged THRR unchanged     Progression   Progression Continue to progress workloads to maintain intensity without signs/symptoms of physical distress. Continue to progress workloads to maintain intensity without signs/symptoms of physical distress. Continue to progress workloads to maintain intensity without signs/symptoms of physical distress. Continue to progress workloads to maintain intensity without signs/symptoms of physical distress. Continue to progress workloads to maintain intensity without signs/symptoms of physical distress.     Resistance Training   Training Prescription Yes Yes Yes Yes Yes   Weight blue bands blue bands blue bands blue bands blue bands   Reps 10-12 10-12  10 minutes of strength training 10-12  10 minutes of strength training 10-12  10 minutes of strength training 10-12  10 minutes of strength training     Interval Training   Interval Training No No No No No     Treadmill   MPH 1.7 2.5  - 2.5 2.5   Grade 0 1  - 1 2   Minutes 17 17  - 17 17     Bike   Level  - 1.3 1.5 1.5 1.5   Minutes  - 17 17 17 17      Rower   Level 2 2 4 4   -   Minutes 17 17 17 17   -   Row Name 03/03/16 1500 03/03/16 1600 03/05/16 1500 03/10/16 1500 03/12/16 1600     Exercise Review   Progression  - Yes  -  -  -     Response to Exercise   Blood Pressure (Admit)  - 120/64 140/80 150/100 144/80   Blood Pressure (Exercise)  - 152/82 130/82 148/80 160/86   Blood Pressure (Exit)  - 120/60 124/80 140/84 128/68   Heart Rate (Admit)  - 83 bpm 68  bpm 79 bpm 75 bpm   Heart Rate (Exercise)  - 116 bpm 111 bpm 129 bpm 106 bpm   Heart Rate (Exit)  - 86 bpm 62 bpm 79 bpm 79 bpm   Oxygen Saturation (Admit)  - 94 % 95 % 97 % 96 %   Oxygen Saturation (Exercise)  - 90 % 89 % 85 %  increased to 88 quickly 91 %   Oxygen Saturation (Exit)  - 95 % 97 % 96 % 96 %   Rating of Perceived Exertion (Exercise)  - 11 11 11 11    Perceived Dyspnea (Exercise)  - 1 1 1 1    Duration  - Progress to 45 minutes of aerobic exercise without signs/symptoms of physical distress Progress to 45 minutes of aerobic exercise without signs/symptoms of physical distress Progress to 45 minutes of aerobic exercise without signs/symptoms of physical distress Progress to 45 minutes of aerobic exercise without signs/symptoms of physical distress   Intensity  - THRR unchanged THRR unchanged THRR unchanged THRR  unchanged     Progression   Progression  - Continue to progress workloads to maintain intensity without signs/symptoms of physical distress. Continue to progress workloads to maintain intensity without signs/symptoms of physical distress. Continue to progress workloads to maintain intensity without signs/symptoms of physical distress. Continue to progress workloads to maintain intensity without signs/symptoms of physical distress.     Resistance Training   Training Prescription  - Yes Yes Yes Yes   Weight  - blue bands blue bands blue bands blue bands   Reps  - 10-12  10 minutes of strength training 10-12  10 minutes of strength training -  10 minutes of strength training 10-12  10 minutes of strength training     Interval Training   Interval Training  - No No No No     Treadmill   MPH  - 2.5 2.5 2.5  -   Grade  - 2 2 2   -   Minutes  - 17 17 34  -     Bike   Level  - 1.5  -  - 1.5   Minutes  - 17  -  - 17     Rower   Level  - 3 3  - 3   Minutes  - 17 17  - 17     Home Exercise Plan   Plans to continue exercise at Home  -  -  -  -   Frequency Add 3  additional days to program exercise sessions.  -  -  -  -   Row Name 03/17/16 1551 03/24/16 1520 03/26/16 1500 03/31/16 1500 04/07/16 1500     Exercise Review   Progression  -  - Yes  -  -     Response to Exercise   Blood Pressure (Admit) 140/74 146/76 124/78 142/84 140/74   Blood Pressure (Exercise) 156/76 142/72 124/60 140/84 148/68   Blood Pressure (Exit) 126/82 122/76 124/76 118/60 108/68   Heart Rate (Admit) 77 bpm 82 bpm 82 bpm 77 bpm 77 bpm   Heart Rate (Exercise) 100 bpm 115 bpm 109 bpm 112 bpm 119 bpm   Heart Rate (Exit) 79 bpm 85 bpm 95 bpm 82 bpm 88 bpm   Oxygen Saturation (Admit) 97 % 96 % 96 % 95 % 96 %   Oxygen Saturation (Exercise) 85 % 90 % 91 % 92 % 88 %   Oxygen Saturation (Exit) 97 % 96 % 95 % 94 % 96 %   Rating of Perceived Exertion (Exercise) 12 12 12 11 12    Perceived Dyspnea (Exercise) 1 1 1 1 2    Duration Progress to 45 minutes of aerobic exercise without signs/symptoms of physical distress Progress to 45 minutes of aerobic exercise without signs/symptoms of physical distress Progress to 45 minutes of aerobic exercise without signs/symptoms of physical distress Progress to 45 minutes of aerobic exercise without signs/symptoms of physical distress Progress to 45 minutes of aerobic exercise without signs/symptoms of physical distress   Intensity THRR unchanged THRR unchanged THRR unchanged THRR unchanged THRR unchanged     Progression   Progression Continue to progress workloads to maintain intensity without signs/symptoms of physical distress. Continue to progress workloads to maintain intensity without signs/symptoms of physical distress. Continue to progress workloads to maintain intensity without signs/symptoms of physical distress. Continue to progress workloads to maintain intensity without signs/symptoms of physical distress. Continue to progress workloads to maintain intensity without signs/symptoms of physical distress.     Resistance Training  Training  Prescription Yes Yes Yes Yes Yes   Weight blue bands blue bands blue bands blue bands blue bands   Reps 10-12  10 minutes of strength training 10-12  10 minutes of strength training 10-12  10 minutes of strength training 10-12  10 minutes of strength training 10-12  10 minutes of strength training     Interval Training   Interval Training No No No No No     Treadmill   MPH 2.5 2.5 2.7 2.7 2.7   Grade 2 2 4 4 4    Minutes 17 17 17 17 17      Bike   Level 1.5 1.5  - 1.5 1.5   Minutes 17 17  - 17 17     Rower   Level 3 3 3 3 3    Minutes 17 17 17 17 17    Row Name 04/09/16 1600 04/14/16 1500 04/16/16 1500 04/21/16 1500 04/23/16 1600     Exercise Review   Progression  - Yes  -  -  -     Response to Exercise   Blood Pressure (Admit) 138/70 140/80 140/60 132/50 132/70   Blood Pressure (Exercise) 138/82 156/70 132/72 130/64 146/80   Blood Pressure (Exit) 122/76 104/68 136/64 114/60 122/70   Heart Rate (Admit) 88 bpm 71 bpm 69 bpm 80 bpm 90 bpm   Heart Rate (Exercise) 122 bpm 117 bpm 111 bpm 116 bpm 116 bpm   Heart Rate (Exit) 88 bpm 86 bpm 84 bpm 94 bpm 88 bpm   Oxygen Saturation (Admit) 96 % 96 % 95 % 95 % 96 %   Oxygen Saturation (Exercise) 88 % 89 % 90 % 90 % 88 %   Oxygen Saturation (Exit) 96 % 96 % 97 % 94 % 95 %   Rating of Perceived Exertion (Exercise) 12 12 11 12 12    Perceived Dyspnea (Exercise) 1 1 1 2 2    Duration Progress to 45 minutes of aerobic exercise without signs/symptoms of physical distress Progress to 45 minutes of aerobic exercise without signs/symptoms of physical distress Progress to 45 minutes of aerobic exercise without signs/symptoms of physical distress Progress to 45 minutes of aerobic exercise without signs/symptoms of physical distress Progress to 45 minutes of aerobic exercise without signs/symptoms of physical distress   Intensity THRR unchanged THRR unchanged THRR unchanged THRR unchanged THRR unchanged     Progression   Progression Continue to  progress workloads to maintain intensity without signs/symptoms of physical distress. Continue to progress workloads to maintain intensity without signs/symptoms of physical distress. Continue to progress workloads to maintain intensity without signs/symptoms of physical distress. Continue to progress workloads to maintain intensity without signs/symptoms of physical distress. Continue to progress workloads to maintain intensity without signs/symptoms of physical distress.     Resistance Training   Training Prescription Yes Yes Yes Yes Yes   Weight blue bands blue bands blue bands blue bands blue bands   Reps 10-12  10 minutes of strength training 10-12  10 minutes of strength training 10-12  10 minutes of strength training 10-12  10 minutes of strength training 10-12  10 minutes of strength training     Interval Training   Interval Training No No No No No     Treadmill   MPH 2.7 2.7 2.7 2.7 2.7   Grade 4 4 4 4 4    Minutes 17 17 17 17 17      Bike   Level  - 1.6 1.6 1.6 1.6   Minutes  -  17 17 17 17      Rower   Level 3 3  - 3  -   Minutes 17 17  - 17  -   Row Name 04/28/16 1525 04/30/16 1500 05/05/16 1600 05/07/16 1601       Response to Exercise   Blood Pressure (Admit) 142/72 104/60 130/60 130/80    Blood Pressure (Exercise) 158/66 160/60 160/70 164/80    Blood Pressure (Exit) 108/64 128/62 130/62 130/70    Heart Rate (Admit) 85 bpm 79 bpm 61 bpm 114 bpm    Heart Rate (Exercise) 118 bpm 116 bpm 120 bpm 126 bpm    Heart Rate (Exit) 97 bpm 87 bpm 64 bpm 75 bpm    Oxygen Saturation (Admit) 96 % 95 % 97 % 96 %    Oxygen Saturation (Exercise) 90 % 89 % 91 % 89 %    Oxygen Saturation (Exit) 94 % 95 % 95 % 94 %    Rating of Perceived Exertion (Exercise) 15 13 13 13     Perceived Dyspnea (Exercise) 3 2 2 1     Duration Progress to 45 minutes of aerobic exercise without signs/symptoms of physical distress Progress to 45 minutes of aerobic exercise without signs/symptoms of physical  distress Progress to 45 minutes of aerobic exercise without signs/symptoms of physical distress Progress to 45 minutes of aerobic exercise without signs/symptoms of physical distress    Intensity THRR unchanged THRR unchanged THRR unchanged THRR unchanged      Progression   Progression Continue to progress workloads to maintain intensity without signs/symptoms of physical distress. Continue to progress workloads to maintain intensity without signs/symptoms of physical distress. Continue to progress workloads to maintain intensity without signs/symptoms of physical distress. Continue to progress workloads to maintain intensity without signs/symptoms of physical distress.      Resistance Training   Training Prescription Yes Yes Yes Yes    Weight blue bands blue bands blue bands blue bands    Reps 10-12  10 minutes of strength training 10-12  10 minutes of strength training 10-12  10 minutes of strength training 10-12  10 minutes of strength training      Interval Training   Interval Training No No No No      Treadmill   Minutes 17  -  -  -      Bike   Level 1.7  - 1.7 1.7    Minutes 17  - 17 17      Elliptical   Level  - 2 2 2     Minutes  - 17 17 17       Rower   Level  - 3 3 3     Minutes  - 17 17 17        Exercise Comments:     Exercise Comments    Row Name 02/13/16 1627 03/03/16 1536 03/12/16 0836 04/13/16 1356 04/28/16 1654   Exercise Comments Today is first day of exercise, tolerated well. Home exercise prescription completed Patient is progressing well in program. We are keeping a close eye on oxygen saturations during exercise. Patient has reported oxygen saturations in the high 70's during exercise at home. The lowest we have seen here in pulmonary rehab is 85% on one occasion--most of the time 88%-91%. Will cont. to monitor and progress when appropriate. Patient isprogressing well in program. Patient was recording low o2 sats at home on his personal pulse oximeter.  Problem was resolved with a new pulse oximeter. Very motivated to make changes. Will  cont. to monitor progression. Patient has switched from the Treadmill to the elliptical which is more challenging for him. Patient also has decided to participate in [place] at [facility] upon graduation.   Nambe Name 05/11/16 1146           Exercise Comments Patient is doing well in the program. Improving on the elliptical which he finds challenging. Only has one sessions left.           Discharge Exercise Prescription (Final Exercise Prescription Changes):     Exercise Prescription Changes - 05/07/16 1601      Response to Exercise   Blood Pressure (Admit) 130/80   Blood Pressure (Exercise) 164/80   Blood Pressure (Exit) 130/70   Heart Rate (Admit) 114 bpm   Heart Rate (Exercise) 126 bpm   Heart Rate (Exit) 75 bpm   Oxygen Saturation (Admit) 96 %   Oxygen Saturation (Exercise) 89 %   Oxygen Saturation (Exit) 94 %   Rating of Perceived Exertion (Exercise) 13   Perceived Dyspnea (Exercise) 1   Duration Progress to 45 minutes of aerobic exercise without signs/symptoms of physical distress   Intensity THRR unchanged     Progression   Progression Continue to progress workloads to maintain intensity without signs/symptoms of physical distress.     Resistance Training   Training Prescription Yes   Weight blue bands   Reps 10-12  10 minutes of strength training     Interval Training   Interval Training No     Bike   Level 1.7   Minutes 17     Elliptical   Level 2   Minutes 17     Rower   Level 3   Minutes 17       Nutrition:  Target Goals: Understanding of nutrition guidelines, daily intake of sodium <1525m, cholesterol <2074m calories 30% from fat and 7% or less from saturated fats, daily to have 5 or more servings of fruits and vegetables.  Biometrics:     Pre Biometrics - 01/20/16 1050      Pre Biometrics   Grip Strength 35 kg       Nutrition Therapy  Plan and Nutrition Goals:     Nutrition Therapy & Goals - 03/05/16 1519      Nutrition Therapy   Diet Therapeutic Lifestyle Change     Personal Nutrition Goals   Personal Goal #1 1-2 lb wt loss per week to a wt loss goal of 6-24 lb [place] regarding individualized specific dietary modifications aiming towards targeted core components such as weight, hypertension, lipid management, diabetes, heart failure and other comorbidities.   Expected Outcomes Short Term Goal: Understand basic principles of dietary content, such as calories, fat, sodium, cholesterol and nutrients.;Long Term Goal: Adherence to prescribed nutrition plan.      Nutrition Discharge: Rate Your Plate Scores:     Nutrition Assessments - 05/05/16 1423      Rate Your Plate Scores   Pre Score 49   Post Score 61      Psychosocial: Target Goals: Acknowledge presence or absence of depression, maximize coping skills, provide positive support system. Participant is able to verbalize types and ability to use techniques and skills needed for reducing stress and depression.  Initial Review & Psychosocial Screening:     Initial Psych Review & Screening - 01/20/16 1103      Initial Review  Current issues with --  none identified     Family Dynamics   Good Support System? Yes   Concerns --  none identified     Barriers   Psychosocial barriers to participate in program There are no identifiable barriers or psychosocial needs.     Screening Interventions   Interventions Encouraged to exercise      Quality of Life Scores:     Quality of Life - 04/30/16 1534      Quality of Life Scores   Health/Function Post 19.97 %   Socioeconomic Post 26.38 %   Psych/Spiritual Post 22.07 %   Family Post 28.75 %   GLOBAL Post 22.94 %      PHQ-9: Recent Review Flowsheet Data    Depression screen [facility] 2/9 01/20/2016    Decreased Interest 0   Down, Depressed, Hopeless 0   PHQ - 2 Score 0      Psychosocial Evaluation and Intervention:     Psychosocial Evaluation - 01/20/16 1104      Psychosocial Evaluation & Interventions   Interventions --  none   Comments none required   Continued Psychosocial Services Needed No      Psychosocial Re-Evaluation:     Psychosocial Re-Evaluation    Alvan Name 03/10/16 0844 04/09/16 0845 05/11/16 0839         Psychosocial Re-Evaluation   Interventions Encouraged to attend Pulmonary Rehabilitation for the exercise Encouraged to attend Pulmonary Rehabilitation for the exercise  -     Comments -  no psychosocial concerns identified None identified at this time None identified     Continued Psychosocial Services Needed No No No       Education: Education Goals: Education classes will be provided on a weekly basis, covering required topics. Participant will state understanding/return demonstration of topics presented.  Learning Barriers/Preferences:     Learning Barriers/Preferences - 01/20/16 1042      Learning Barriers/Preferences   Learning Barriers None   Learning Preferences Written Material;Video;Verbal Instruction;Skilled Demonstration;Pictoral;Individual Instruction;Group Instruction;Computer/Internet;Audio      Education Topics: Risk Factor Reduction:  -Group instruction that is supported by a PowerPoint presentation. Instructor discusses the definition of a risk factor, different risk factors for pulmonary disease, and how the heart and lungs work together.     Nutrition for Pulmonary Patient:  -Group instruction provided by PowerPoint slides, verbal discussion, and written materials to support subject matter. The instructor gives an explanation and review of healthy diet recommendations, which includes a discussion on weight management, recommendations for fruit and vegetable consumption, as well as protein, fluid, caffeine, fiber, sodium,  sugar, and alcohol. Tips for eating when patients are short of breath are discussed. Flowsheet Row PULMONARY REHAB OTHER RESPIRATORY from 05/07/2016 in [place]  Date  03/12/16  Educator  RD  Instruction Review Code  2- meets goals/outcomes      Pursed Lip Breathing:  -Group instruction that is supported by demonstration and informational handouts. Instructor discusses the benefits of pursed lip and diaphragmatic breathing and detailed demonstration on how to preform both.   Flowsheet Row PULMONARY REHAB OTHER RESPIRATORY from 05/07/2016 in [place]  Date  04/09/16  Educator  RT  Instruction Review Code  2- meets goals/outcomes      Oxygen Safety:  -Group instruction provided by PowerPoint, verbal discussion, and written material to support subject matter. There is an overview of "What is Oxygen" and "Why do we need it".  Instructor also reviews  how to create a safe environment for oxygen use, the importance of using oxygen as prescribed, and the risks of noncompliance. There is a brief discussion on traveling with oxygen and resources the patient may utilize. Flowsheet Row PULMONARY REHAB OTHER RESPIRATORY from 05/07/2016 in [place]  Date  03/26/16  Educator  rn  Instruction Review Code  2- meets goals/outcomes      Oxygen Equipment:  -Group instruction provided by [name] Staff utilizing handouts, written materials, and equipment demonstrations.   Signs and Symptoms:  -Group instruction provided by written material and verbal discussion to support subject matter. Warning signs and symptoms of infection, stroke, and heart attack are reviewed and when to call the physician/911 reinforced. Tips for preventing the spread of infection discussed. Flowsheet Row PULMONARY REHAB OTHER RESPIRATORY from 05/07/2016 in [place]  Date  04/30/16  Educator  RN   Instruction Review Code  2- meets goals/outcomes      Advanced Directives:  -Group instruction provided by verbal instruction and written material to support subject matter. Instructor reviews Advanced Directive laws and proper instruction for filling out document.   Pulmonary Video:  -Group video education that reviews the importance of medication and oxygen compliance, exercise, good nutrition, pulmonary hygiene, and pursed lip and diaphragmatic breathing for the pulmonary patient.   Exercise for the Pulmonary Patient:  -Group instruction that is supported by a PowerPoint presentation. Instructor discusses benefits of exercise, core components of exercise, frequency, duration, and intensity of an exercise routine, importance of utilizing pulse oximetry during exercise, safety while exercising, and options of places to exercise outside of rehab.   Flowsheet Row PULMONARY REHAB OTHER RESPIRATORY from 05/07/2016 in [place]  Date  04/23/16  Educator  EP  Instruction Review Code  2- meets goals/outcomes      Pulmonary Medications:  -Verbally interactive group education provided by instructor with focus on inhaled medications and proper administration.   Anatomy and Physiology of the Respiratory System and Intimacy:  -Group instruction provided by PowerPoint, verbal discussion, and written material to support subject matter. Instructor reviews respiratory cycle and anatomical components of the respiratory system and their functions. Instructor also reviews differences in obstructive and restrictive respiratory diseases with examples of each. Intimacy, Sex, and Sexuality differences are reviewed with a discussion on how relationships can change when diagnosed with pulmonary disease. Common sexual concerns are reviewed. Flowsheet Row PULMONARY REHAB OTHER RESPIRATORY from 05/07/2016 in [place]  Date  02/20/16  Educator  RN   Instruction Review Code  2- meets goals/outcomes      Knowledge Questionnaire Score:     Knowledge Questionnaire Score - 04/30/16 1530      Knowledge Questionnaire Score   Post Score 13/13      Core Components/Risk Factors/Patient Goals at Admission:     Personal Goals and Risk Factors at Admission - 01/20/16 1053      Core Components/Risk Factors/Patient Goals on Admission    Weight Management Obesity   Increase Strength and Stamina Yes   Intervention Provide advice, education, support and counseling about physical activity/exercise needs.;Develop an individualized exercise prescription for aerobic and resistive training based on initial evaluation findings, risk stratification, comorbidities and participant's personal goals.   Expected Outcomes Achievement of increased cardiorespiratory fitness and enhanced flexibility, muscular endurance and strength shown through measurements of functional capacity and personal statement of participant.   Improve shortness of breath with  ADL's Yes   Intervention Provide education, individualized exercise plan and daily activity instruction to help decrease symptoms of SOB with activities of daily living.   Expected Outcomes Short Term: Achieves a reduction of symptoms when performing activities of daily living.      Core Components/Risk Factors/Patient Goals Review:      Goals and Risk Factor Review    Row Name 01/20/16 1101 02/13/16 1629 03/10/16 0842 04/09/16 0843 05/11/16 0836     Core Components/Risk Factors/Patient Goals Review   Personal Goals Review Weight Management/Obesity;Increase Strength and Stamina;Improve shortness of breath with ADL's Weight Management/Obesity;Increase Strength and Stamina;Improve shortness of breath with ADL's  - Weight Management/Obesity;Improve shortness of breath with ADL's;Increase Strength and Stamina;Develop more efficient breathing techniques such as purse lipped breathing and diaphragmatic breathing  and practicing self-pacing with activity.  -   Review To increase strength and stamina to improve shortness of breath, and lose weight to improve breathing First day of exercise, tolerated well. has lost 1.5kg, very strong, tolerating workloads increases well has lost 3 kg since starting program, blood pressure is controlled, workloads are increasing, progressing well Has been able to keep the 3 kg off, he has 1 more exercise session til graduation, has made great progress re: stamina and strength, is to continue exercising at the [employer] after graduation.   Expected Outcomes Improved strength and stamina with also weight loss. Should see progress in next 30 days, will progress as tolerated. continue to increase workloads as tolerated to increase strength and stamina Continue to increase workloads as tolerated Has met his program goals      Core Components/Risk Factors/Patient Goals at Discharge (Final Review):      Goals and Risk Factor Review - 05/11/16 0836      Core Components/Risk Factors/Patient Goals Review   Review Has been able to keep the 3 kg off, he has 1 more exercise session til graduation, has made great progress re: stamina and strength, is to continue exercising at the [employer] after graduation.   Expected Outcomes Has met his program goals      ITP Comments:   Comments: ITP REVIEW Pt is making expected progress toward pulmonary rehab goals after completing 23 sessions. Recommend continued exercise, life style modification, education, and utilization of breathing techniques to increase stamina and strength and decrease shortness of breath with exertion.

## 2016-05-14 ENCOUNTER — Encounter (HOSPITAL_COMMUNITY): Payer: Medicare Other

## 2016-05-19 ENCOUNTER — Encounter (HOSPITAL_COMMUNITY): Payer: Medicare Other

## 2016-05-21 ENCOUNTER — Encounter (HOSPITAL_COMMUNITY): Payer: Medicare Other

## 2016-05-26 ENCOUNTER — Encounter (HOSPITAL_COMMUNITY): Payer: Medicare Other

## 2016-06-04 NOTE — Progress Notes (Signed)
Discharge Summary  Patient Details  Name: Douglas Lopez MRN: [id number] Date of Birth: Apr 11, 1946 Referring Provider:   April Manson Pulmonary Rehab Walk Test from 02/06/2016 in [place]  Referring Provider  Dr. Nelda Marseille       Number of Visits: 23  Reason for Discharge:  Patient independent in their exercise.  Smoking History:  History  Smoking Status  . Never Smoker  Smokeless Tobacco  . Never Used    Diagnosis:  Idiopathic pulmonary fibrosis ([place])  ADL [facility]:     Pulmonary Assessment Scores    Row Name 04/30/16 1532         ADL UCSD   ADL Phase Exit     SOB Score total 43        Initial Exercise Prescription:     Initial Exercise Prescription - 02/07/16 0600      Date of Initial Exercise RX and Referring Provider   Date 02/07/16   Referring Provider Dr. Nelda Marseille     Treadmill   MPH 1.7   Grade 0   Minutes 17     Bike   Level 0.5   Minutes 17     Rower   Level 1   Minutes 17     Prescription Details   Frequency (times per week) 2   Duration Progress to 45 minutes of aerobic exercise without signs/symptoms of physical distress     Intensity   THRR 40-80% of Max Heartrate 60-120   Ratings of Perceived Exertion 11-13   Perceived Dyspnea 0-4     Progression   Progression Continue progressive overload as per policy without signs/symptoms or physical distress.     Resistance Training   Training Prescription Yes   Weight blue band   Reps 10-12      Discharge Exercise Prescription (Final Exercise Prescription Changes):     Exercise Prescription Changes - 05/07/16 1601      Response to Exercise   Blood Pressure (Admit) 130/80   Blood Pressure (Exercise) 164/80   Blood Pressure (Exit) 130/70   Heart Rate (Admit) 114 bpm   Heart Rate (Exercise) 126 bpm   Heart Rate (Exit) 75 bpm   Oxygen Saturation (Admit) 96 %   Oxygen Saturation (Exercise) 89 %   Oxygen Saturation (Exit) 94 %   Rating of Perceived  Exertion (Exercise) 13   Perceived Dyspnea (Exercise) 1   Duration Progress to 45 minutes of aerobic exercise without signs/symptoms of physical distress   Intensity THRR unchanged     Progression   Progression Continue to progress workloads to maintain intensity without signs/symptoms of physical distress.     Resistance Training   Training Prescription Yes   Weight blue bands   Reps 10-12  10 minutes of strength training     Interval Training   Interval Training No     Bike   Level 1.7   Minutes 17     Elliptical   Level 2   Minutes 17     Rower   Level 3   Minutes 17      Functional Capacity:     6 Minute Walk    Row Name 02/07/16 0640 05/12/16 1514       6 Minute Walk   Phase Initial Discharge    Distance 1720 feet 1800 feet    Walk Time 6 minutes 6 minutes    # of Rest Breaks 0 0    MPH 3.25 3.4    METS  3.45 3.6    RPE 12 11    Perceived Dyspnea  1 1    Symptoms No No    Resting HR 93 bpm 86 bpm    Resting BP 132/80 122/60    Max Ex. HR 121 bpm 135 bpm    Max Ex. BP 140/84 150/80    2 Minute Post BP 144/70  -      Interval HR   Baseline HR  - 86    1 Minute HR  - 102    2 Minute HR  - 135    3 Minute HR  - 133    4 Minute HR  - 114    5 Minute HR  - 116    6 Minute HR  - 118    2 Minute Post HR  - 101    Interval Heart Rate?  - Yes      Interval Oxygen   Interval Oxygen? Yes Yes    Baseline Oxygen Saturation % 97 % 97 %    Baseline Liters of Oxygen 0 L 0 L    1 Minute Oxygen Saturation % 95 % 96 %    1 Minute Liters of Oxygen 0 L 0 L    2 Minute Oxygen Saturation % 91 % 91 %    2 Minute Liters of Oxygen 0 L 0 L    3 Minute Oxygen Saturation % 91 % 91 %    3 Minute Liters of Oxygen 0 L 0 L    4 Minute Oxygen Saturation % 92 % 91 %    4 Minute Liters of Oxygen 0 L 0 L    5 Minute Oxygen Saturation % 89 % 91 %    5 Minute Liters of Oxygen 0 L 0 L    6 Minute Oxygen Saturation % 92 % 90 %    6 Minute Liters of Oxygen 0 L 0 L    2  Minute Post Oxygen Saturation % 97 % 96 %    2 Minute Post Liters of Oxygen 0 L 0 L       Psychological, QOL, Others - Outcomes: PHQ 2/9: Depression screen PHQ 2/9 01/20/2016  Decreased Interest 0  Down, Depressed, Hopeless 0  PHQ - 2 Score 0    Quality of Life:     Quality of Life - 04/30/16 1534      Quality of Life Scores   Health/Function Post 19.97 %   Socioeconomic Post 26.38 %   Psych/Spiritual Post 22.07 %   Family Post 28.75 %   GLOBAL Post 22.94 %      Personal Goals: Goals established at orientation with interventions provided to work toward goal.     Personal Goals and Risk Factors at Admission - 01/20/16 1053      Core Components/Risk Factors/Patient Goals on Admission    Weight Management Obesity   Increase Strength and Stamina Yes   Intervention Provide advice, education, support and counseling about physical activity/exercise needs.;Develop an individualized exercise prescription for aerobic and resistive training based on initial evaluation findings, risk stratification, comorbidities and participant's personal goals.   Expected Outcomes Achievement of increased cardiorespiratory fitness and enhanced flexibility, muscular endurance and strength shown through measurements of functional capacity and personal statement of participant.   Improve shortness of breath with ADL's Yes   Intervention Provide education, individualized exercise plan and daily activity instruction to help decrease symptoms of SOB with activities of daily living.   Expected Outcomes  Short Term: Achieves a reduction of symptoms when performing activities of daily living.       Personal Goals Discharge:     Goals and Risk Factor Review    Row Name 01/20/16 1101 02/13/16 1629 03/10/16 0842 04/09/16 0843 05/11/16 0836     Core Components/Risk Factors/Patient Goals Review   Personal Goals Review Weight Management/Obesity;Increase Strength and Stamina;Improve shortness of breath with  ADL's Weight Management/Obesity;Increase Strength and Stamina;Improve shortness of breath with ADL's  - Weight Management/Obesity;Improve shortness of breath with ADL's;Increase Strength and Stamina;Develop more efficient breathing techniques such as purse lipped breathing and diaphragmatic breathing and practicing self-pacing with activity.  -   Review To increase strength and stamina to improve shortness of breath, and lose weight to improve breathing First day of exercise, tolerated well. has lost 1.5kg, very strong, tolerating workloads increases well has lost 3 kg since starting program, blood pressure is controlled, workloads are increasing, progressing well Has been able to keep the 3 kg off, he has 1 more exercise session til graduation, has made great progress re: stamina and strength, is to continue exercising at the [employer] after graduation.   Expected Outcomes Improved strength and stamina with also weight loss. Should see progress in next 30 days, will progress as tolerated. continue to increase workloads as tolerated to increase strength and stamina Continue to increase workloads as tolerated Has met his program goals      Nutrition & Weight - Outcomes:     Pre Biometrics - 01/20/16 1050      Pre Biometrics   Grip Strength 35 kg       Nutrition:     Nutrition Therapy & Goals - 03/05/16 1519      Nutrition Therapy   Diet Therapeutic Lifestyle Change     Personal Nutrition Goals   Personal Goal #1 1-2 lb wt loss per week to a wt loss goal of 6-24 lb at graduation from [place], educate and counsel regarding individualized specific dietary modifications aiming towards targeted core components such as weight, hypertension, lipid management, diabetes, heart failure and other comorbidities.   Expected Outcomes Short Term Goal: Understand basic principles of dietary content, such as calories, fat, sodium,  cholesterol and nutrients.;Long Term Goal: Adherence to prescribed nutrition plan.      Nutrition Discharge:     Nutrition Assessments - 05/05/16 1423      Rate Your Plate Scores   Pre Score 49   Post Score 61      Education Questionnaire Score:     Knowledge Questionnaire Score - 04/30/16 1530      Knowledge Questionnaire Score   Post Score 13/13      Goals reviewed with patient; copy given to patient.

## 2016-06-04 NOTE — Addendum Note (Signed)
Encounter addended by: Drema PryJoan A Akiva Brassfield, RN on: 06/04/2016  9:57 AM<BR>    Actions taken: Sign clinical note, Episode resolved

## 2016-06-11 ENCOUNTER — Ambulatory Visit (INDEPENDENT_AMBULATORY_CARE_PROVIDER_SITE_OTHER): Payer: Medicare Other | Admitting: Interventional Cardiology

## 2016-06-11 ENCOUNTER — Encounter (INDEPENDENT_AMBULATORY_CARE_PROVIDER_SITE_OTHER): Payer: Self-pay

## 2016-06-11 ENCOUNTER — Encounter: Payer: Self-pay | Admitting: Interventional Cardiology

## 2016-06-11 VITALS — BP 150/70 | HR 82 | Ht 71.0 in | Wt 246.0 lb

## 2016-06-11 DIAGNOSIS — I10 Essential (primary) hypertension: Secondary | ICD-10-CM | POA: Diagnosis not present

## 2016-06-11 DIAGNOSIS — E782 Mixed hyperlipidemia: Secondary | ICD-10-CM

## 2016-06-11 DIAGNOSIS — I2510 Atherosclerotic heart disease of native coronary artery without angina pectoris: Secondary | ICD-10-CM | POA: Diagnosis not present

## 2016-06-11 DIAGNOSIS — Z7689 Persons encountering health services in other specified circumstances: Secondary | ICD-10-CM | POA: Diagnosis not present

## 2016-06-11 DIAGNOSIS — R0609 Other forms of dyspnea: Secondary | ICD-10-CM

## 2016-06-11 DIAGNOSIS — I2584 Coronary atherosclerosis due to calcified coronary lesion: Secondary | ICD-10-CM

## 2016-06-11 NOTE — Progress Notes (Signed)
Cardiology Office Note   Date:  06/11/2016   ID:  Douglas Miyamotoennis Bralley, DOB 1945-11-18, MRN [id number]  PCP:  Delbert HarnessBRISCOE, KIM, MD    Chief Complaint  Patient presents with  . Establish Care     Wt Readings from Last 3 Encounters:  06/11/16 111.6 kg (246 lb)  05/07/16 109.6 kg (241 lb 10 oz)  05/05/16 109.1 kg (240 lb 8.4 oz)       History of Present Illness: Douglas Lopez is a 70 y.o. male  Who had a chest CT which showed coronary calcifications back in 2017.  He had annual stress tests [place].  [facility] [place] [facility] and they managed his sx.    He went to pulmonary rehab.  He had an irregular heart beat and was diagnosed with PVCs.  He is in rehab for IPF.  He does not use supplemental oxygen.    He has had some pressure in his chest at random times.  It can happen while sitting and reading.  It can last 30 seconds.  It is not severe.  He has not noticed it while exercising.    He has never smoked.  His father had CABG x 4, twice, MI at age 70.  Mother had an MI.  Younger brother has had an MI.    He has been exercising 3x/week and he walks the treadmill for 35-45 minutes.      Past Medical History:  Diagnosis Date  . Hypertension   . Idiopathic pulmonary fibrosis (HCC)   . Right bundle branch block     History reviewed. No pertinent surgical history.   Current Outpatient Prescriptions  Medication Sig Dispense Refill  . amLODipine (NORVASC) 5 MG tablet Take 5 mg by mouth daily.    Marland Kitchen. aspirin 81 MG tablet Take 81 mg by mouth daily.    . Calcium Carb-Cholecalciferol (CALCIUM-VITAMIN D3) 600-400 MG-UNIT CAPS Take 1 tablet by mouth daily.    . calcium carbonate (TUMS - DOSED IN MG ELEMENTAL CALCIUM) 500 MG chewable tablet Chew 1 tablet by mouth daily.    . cholecalciferol (VITAMIN D) 1000 units tablet Take 1,000 Units by mouth daily.    . Cholecalciferol (VITAMIN D3) 2000 units TABS Take 1 tablet by mouth daily.    Marland Kitchen. FIBER SELECT  GUMMIES PO Take 5 mg by mouth daily.    Marland Kitchen. levothyroxine (SYNTHROID, LEVOTHROID) 125 MCG tablet Take 125 mcg by mouth daily before breakfast.    . Multiple Vitamins-Minerals (MULTIVITAMIN WITH MINERALS) tablet Take 1 tablet by mouth daily.    . Omega-3 Fatty Acids (FISH OIL) 1200 MG CAPS Take 1 capsule by mouth daily.    Marland Kitchen. omeprazole (PRILOSEC) 20 MG capsule Take 20 mg by mouth daily.    . Pirfenidone 267 MG CAPS Take 9 tablets by mouth daily.    . pravastatin (PRAVACHOL) 20 MG tablet Take 20 mg by mouth daily.     No current facility-administered medications for this visit.     Allergies:   Patient has no known allergies.    Social History:  The patient  reports that he has never smoked. He has never used smokeless tobacco. He reports that he does not drink alcohol or use drugs.   Family History:  The patient's family history includes Heart disease in his father and mother.    ROS:  Please see the history of present illness.   Otherwise, review of systems are positive for DOE, that  he attributes to IPF.   All other systems are reviewed and negative.    PHYSICAL EXAM: VS:  BP (!) 150/70   Pulse 82   Ht 5\' 11"  (1.803 m)   Wt 111.6 kg (246 lb)   SpO2 94%   BMI 34.31 kg/m  , BMI Body mass index is 34.31 kg/m. GEN: Well nourished, well developed, in no acute distress , cough HEENT: normal  Neck: no JVD, carotid bruits, or masses Cardiac: RRR; no murmurs, rubs, or gallops, tr pretibial bilateral edema  Respiratory:  clear to auscultation bilaterally, normal work of breathing GI: soft, nontender, nondistended, + BS MS: no deformity or atrophy  Skin: warm and dry, no rash Neuro:  Strength and sensation are intact Psych: euthymic mood, full affect   EKG:   The ekg ordered today demonstrates NSR, RBBB, PVC   Recent Labs: No results found for requested labs within last 8760 hours.   Lipid Panel No results found for: CHOL, TRIG, HDL, CHOLHDL, VLDL, LDLCALC, LDLDIRECT   Other  studies Reviewed: Additional studies/ records that were reviewed today with results demonstrating: CT as noted above.   ASSESSMENT AND PLAN:  1. Coronary calcification: Given family h/o CAD and DOE, will plan for nuclear stress test to eval for ischemia and hihg risk anatomy.  DOE may be related to lung disease rather than angina but want to make sure there is no high risk anatomy. .  2. HTN: Continue amlodipine 3. Hyperlipidemia: Continue pravastatin. Would try to keep LDL below 100.    Current medicines are reviewed at length with the patient today.  The patient concerns regarding his medicines were addressed.  The following changes have been made:  No change  Labs/ tests ordered today include:   Orders Placed This Encounter  Procedures  . EKG 12-Lead    Recommend 150 minutes/week of aerobic exercise Low fat, low carb, high fiber diet recommended  Disposition:   FU in for stress test   Signed, Lance MussJayadeep Jadeyn Hargett, MD  06/11/2016 3:25 PM    [facility] [address], [place], [place]  [address] Phone: [phone]; Fax: [phone]

## 2016-06-11 NOTE — Patient Instructions (Signed)
**Note De-Identified Breeanne Oblinger Obfuscation** Medication Instructions:  Same-no changes  Labwork: None  Testing/Procedures: Your physician has requested that you have en exercise stress myoview. For further information please visit [email or web address]. Please follow instruction sheet, as given.   Follow-Up: Based on test results.     If you need a refill on your cardiac medications before your next appointment, please call your pharmacy.

## 2016-06-15 ENCOUNTER — Telehealth (HOSPITAL_COMMUNITY): Payer: Self-pay | Admitting: *Deleted

## 2016-06-15 NOTE — Telephone Encounter (Signed)
Patient given detailed instructions per Myocardial Perfusion Study Information Sheet for the test on 06/18/16 at 0715. Patient notified to arrive 15 minutes early and that it is imperative to arrive on time for appointment to keep from having the test rescheduled.  If you need to cancel or reschedule your appointment, please call the office within 24 hours of your appointment. Failure to do so may result in a cancellation of your appointment, and a $50 no show fee. Patient verbalized understanding.Kahmari Herard, Adelene IdlerCynthia W

## 2016-06-18 ENCOUNTER — Ambulatory Visit (HOSPITAL_COMMUNITY): Payer: Medicare Other | Attending: Cardiology

## 2016-06-18 DIAGNOSIS — I2510 Atherosclerotic heart disease of native coronary artery without angina pectoris: Secondary | ICD-10-CM | POA: Insufficient documentation

## 2016-06-18 DIAGNOSIS — R0609 Other forms of dyspnea: Secondary | ICD-10-CM

## 2016-06-18 DIAGNOSIS — I2584 Coronary atherosclerosis due to calcified coronary lesion: Secondary | ICD-10-CM

## 2016-06-18 LAB — MYOCARDIAL PERFUSION IMAGING
CHL CUP MPHR: 150 {beats}/min
CHL CUP NUCLEAR SDS: 7
CHL CUP NUCLEAR SRS: 6
CHL CUP RESTING HR STRESS: 73 {beats}/min
CSEPEDS: 0 s
CSEPEW: 9.9 METS
CSEPHR: 87 %
CSEPPHR: 131 {beats}/min
Exercise duration (min): 8 min
LV dias vol: 157 mL (ref 62–150)
LVSYSVOL: 89 mL
RATE: 0.44
RPE: 18
SSS: 13
TID: 1.05

## 2016-06-18 MED ORDER — TECHNETIUM TC 99M TETROFOSMIN IV KIT
32.3000 | PACK | Freq: Once | INTRAVENOUS | Status: AC | PRN
  Administered 2016-06-18: 32.3 via INTRAVENOUS
  Filled 2016-06-18: qty 33

## 2016-06-18 MED ORDER — TECHNETIUM TC 99M TETROFOSMIN IV KIT
10.6000 | PACK | Freq: Once | INTRAVENOUS | Status: AC | PRN
Start: 2016-06-18 — End: 2016-06-18
  Administered 2016-06-18: 10.6 via INTRAVENOUS
  Filled 2016-06-18: qty 11

## 2016-06-19 ENCOUNTER — Other Ambulatory Visit: Payer: Self-pay

## 2016-06-19 MED ORDER — ISOSORBIDE MONONITRATE ER 30 MG PO TB24: 30.0000 mg | ORAL_TABLET | Freq: Every day | ORAL | 3 refills | Status: DC

## 2016-07-06 ENCOUNTER — Telehealth: Payer: Self-pay

## 2016-07-06 NOTE — Telephone Encounter (Signed)
**Note De-Identified Douglas Lopez Obfuscation** The pt had an abnormal stress test that was intermediate risk, but not high risk on 06/18/16.  I called the pt, per Dr Eldridge DaceVaranasi, to see how his DOE has been since starting Imdur 30 mg on 06/19/16.  The pt states that his DOE has improved. He reports that he has been doing a lot of walking as [place] this [place]. He [place] [facility] [place] and did not notice any DOE. He denies CP, sob and dizziness at this time.  He does c/o fatigue that he states has not changed since his last OV with Dr Eldridge DaceVaranasi on 06/11/16.

## 2016-07-08 NOTE — Telephone Encounter (Signed)
Continue current meds.  Plan for f/u in 4 months.  Let us know if there is any chest pain or change in exercise tolerance before that time.

## 2016-07-09 NOTE — Telephone Encounter (Signed)
**Note De-Identified Douglas Lopez Obfuscation** The pt is advised and he verbalized understanding. We scheduled him an appt to see Dr Eldridge DaceVaranasi on 3/8 and the pt is in agreement with plan.

## 2016-10-08 ENCOUNTER — Encounter: Payer: Self-pay | Admitting: Interventional Cardiology

## 2016-10-08 ENCOUNTER — Ambulatory Visit (INDEPENDENT_AMBULATORY_CARE_PROVIDER_SITE_OTHER): Payer: Medicare Other | Admitting: Interventional Cardiology

## 2016-10-08 VITALS — BP 130/80 | HR 110 | Ht 71.0 in | Wt 247.2 lb

## 2016-10-08 DIAGNOSIS — J84112 Idiopathic pulmonary fibrosis: Secondary | ICD-10-CM

## 2016-10-08 DIAGNOSIS — R9439 Abnormal result of other cardiovascular function study: Secondary | ICD-10-CM | POA: Diagnosis not present

## 2016-10-08 DIAGNOSIS — I2510 Atherosclerotic heart disease of native coronary artery without angina pectoris: Secondary | ICD-10-CM

## 2016-10-08 DIAGNOSIS — Z8249 Family history of ischemic heart disease and other diseases of the circulatory system: Secondary | ICD-10-CM | POA: Diagnosis not present

## 2016-10-08 MED ORDER — ISOSORBIDE MONONITRATE ER 60 MG PO TB24: 60.0000 mg | ORAL_TABLET | Freq: Every day | ORAL | 3 refills | Status: DC

## 2016-10-08 NOTE — Patient Instructions (Signed)
Medication Instructions:  INCREASE isosorbide mononitrate (imdur) to 60 mg daily.  Labwork: None ordered  Testing/Procedures: None ordered  Follow-Up: Call our office [phone]) back after [facility] to determine follow up and interventions.  Any Other Special Instructions Will Be Listed Below (If Applicable).     If you need a refill on your cardiac medications before your next appointment, please call your pharmacy.

## 2016-10-08 NOTE — Progress Notes (Signed)
Cardiology Office Note   Date:  10/08/2016   ID:  Douglas Miyamotoennis Casserly, DOB 02-May-1946, MRN [id number]  PCP:  Delbert HarnessBRISCOE, KIM, MD    No chief complaint on file.    Wt Readings from Last 3 Encounters:  10/08/16 247 lb 3.2 oz (112.1 kg)  06/18/16 246 lb (111.6 kg)  06/11/16 246 lb (111.6 kg)       History of Present Illness: Douglas Lopez is a 71 y.o. male  Who had a chest CT which showed coronary calcifications back in 2017.  [place] annual stress [place].  [place] [facility] [place].    He went to pulmonary rehab.  He had an irregular heart beat and was diagnosed with PVCs.  He is in rehab for IPF.  He does not use supplemental oxygen.    He has never smoked.  His father had CABG x 4, twice, MI at age 71.  Mother had an MI.  Younger brother has had an MI.    He has been exercising 3x/week and he walks the treadmill for 35-45 minutes.  This has decreased with a flu like illness.  His oxygen sats decreases to 90% while doing that exercise.    He still some pressure in his chest at random times.  It can happen while sitting and reading.  It can last 30 seconds.  It is not severe.  He has not noticed it while exercising.  No change with Imdur.  He had a moderate risk stress test.     Past Medical History:  Diagnosis Date  . Hypertension   . Idiopathic pulmonary fibrosis (HCC)   . Right bundle branch block     History reviewed. No pertinent surgical history.   Current Outpatient Prescriptions  Medication Sig Dispense Refill  . amLODipine (NORVASC) 5 MG tablet Take 5 mg by mouth daily.    Marland Kitchen. aspirin 81 MG tablet Take 81 mg by mouth daily.    . Calcium Carb-Cholecalciferol (CALCIUM-VITAMIN D3) 600-400 MG-UNIT CAPS Take 1 tablet by mouth daily.    . cholecalciferol (VITAMIN D) 1000 units tablet Take 1,000 Units by mouth daily.    . Cholecalciferol (VITAMIN D3) 2000 units TABS Take 1 tablet by mouth daily.    Marland Kitchen. FIBER  SELECT GUMMIES PO Take 5 mg by mouth daily.    Marland Kitchen. levothyroxine (SYNTHROID, LEVOTHROID) 125 MCG tablet Take 125 mcg by mouth daily before breakfast.    . Multiple Vitamins-Minerals (MULTIVITAMIN WITH MINERALS) tablet Take 1 tablet by mouth daily.    . Omega-3 Fatty Acids (FISH OIL) 1200 MG CAPS Take 1 capsule by mouth daily.    Marland Kitchen. omeprazole (PRILOSEC) 20 MG capsule Take 20 mg by mouth daily.    . Pirfenidone 801 MG TABS Take 3 tablets by mouth daily.    . pravastatin (PRAVACHOL) 20 MG tablet Take 20 mg by mouth daily.    . isosorbide mononitrate (IMDUR) 30 MG 24 hr tablet Take 1 tablet (30 mg total) by mouth daily. 30 tablet 3   No current facility-administered medications for this visit.     Allergies:   Patient has no known allergies.    Social History:  The patient  reports that he has never smoked. He has never used smokeless tobacco. He reports that he does not drink alcohol or use drugs.   Family History:  The patient's family history includes Heart disease in his father and mother.  ROS:  Please see the history of present illness.   Otherwise, review of systems are positive for DOE, that he attributes to IPF.   All other systems are reviewed and negative.    PHYSICAL EXAM: VS:  BP 130/80   Pulse (!) 110   Ht 5' 11" (1.803 m)   Wt 247 lb 3.2 oz (112.1 kg)   SpO2 95%   BMI 34.48 kg/m  , BMI Body mass index is 34.48 kg/m. GEN: Well nourished, well developed, in no acute distress , cough HEENT: normal  Neck: no JVD, carotid bruits, or masses Cardiac: RRR; no murmurs, rubs, or gallops, tr pretibial bilateral edema  Respiratory:  clear to auscultation bilaterally, normal work of breathing GI: soft, nontender, nondistended, + BS MS: no deformity or atrophy  Skin: warm and dry, no rash Neuro:  Strength and sensation are intact Psych: euthymic mood, full affect   EKG:   The ekg ordered today demonstrates NSR, RBBB, PVC   Recent Labs: No results found for requested labs  within last 8760 hours.   Lipid Panel No results found for: CHOL, TRIG, HDL, CHOLHDL, VLDL, LDLCALC, LDLDIRECT   Other studies Reviewed: Additional studies/ records that were reviewed today with results demonstrating: CT as noted above.  Stress test reviewed.     ASSESSMENT AND PLAN:  1. Coronary calcification: Given family h/o CAD and DOE, we performed nuclear stress test to eval for ischemia which came back as intermediate risk.  DOE may be related to lung disease rather than angina but wanted to make sure there is no high risk anatomy.  Imdur was started but DOE persists.  Increase Imdur to 60 mg daily.  He will have a 6 minute walk at [name] in 11 days.  If he sees no difference, would consider cardiac cath to eval anatomy.  Given his family history, there is a strong possibility of CAD.   2. HTN: Continue amlodipine.  BP controlled.   3. Hyperlipidemia: Continue pravastatin. Would try to keep LDL below 100.    Current medicines are reviewed at length with the patient today.  The patient concerns regarding his medicines were addressed.  The following changes have been made:  No change  Labs/ tests ordered today include:   No orders of the defined types were placed in this encounter.   Recommend 150 minutes/week of aerobic exercise Low fat, low carb, high fiber diet recommended  Disposition:   FU in for stress test   Signed, Samnang Shugars, MD  10/08/2016 3:12 PM    [facility] [address], [place], [place]  [address] Phone: [phone]; Fax: [phone]   

## 2016-10-19 ENCOUNTER — Encounter: Payer: Self-pay | Admitting: Interventional Cardiology

## 2016-10-20 ENCOUNTER — Telehealth: Payer: Self-pay

## 2016-10-20 DIAGNOSIS — I2510 Atherosclerotic heart disease of native coronary artery without angina pectoris: Secondary | ICD-10-CM

## 2016-10-20 NOTE — Telephone Encounter (Signed)
Patient was called and notified that Dr. Eldridge DaceVaranasi would like to do a right and left cardiac catheterization. Cath was scheduled for 10/26/16 at 10:30 AM with Dr. Eldridge DaceVaranasi. Patient is to arrive at the hospital at 8:30 AM. Reviewed pre-cath instructions with patient. Patient notified that his instruction letter will be left at the front desk for him to pick up when he has labs drawn on 10/22/16. Patient verbalized understanding and is in agreement with this plan.

## 2016-10-22 ENCOUNTER — Other Ambulatory Visit: Payer: Medicare Other | Admitting: *Deleted

## 2016-10-22 DIAGNOSIS — I2510 Atherosclerotic heart disease of native coronary artery without angina pectoris: Secondary | ICD-10-CM

## 2016-10-23 LAB — CBC
HEMATOCRIT: 39 % (ref 37.5–51.0)
Hemoglobin: 13.3 g/dL (ref 13.0–17.7)
MCH: 31.1 pg (ref 26.6–33.0)
MCHC: 34.1 g/dL (ref 31.5–35.7)
MCV: 91 fL (ref 79–97)
Platelets: 267 10*3/uL (ref 150–379)
RBC: 4.28 x10E6/uL (ref 4.14–5.80)
RDW: 14.8 % (ref 12.3–15.4)
WBC: 8 10*3/uL (ref 3.4–10.8)

## 2016-10-23 LAB — BASIC METABOLIC PANEL
BUN / CREAT RATIO: 16 (ref 10–24)
BUN: 15 mg/dL (ref 8–27)
CHLORIDE: 99 mmol/L (ref 96–106)
CO2: 24 mmol/L (ref 18–29)
CREATININE: 0.92 mg/dL (ref 0.76–1.27)
Calcium: 9.4 mg/dL (ref 8.6–10.2)
GFR, EST AFRICAN AMERICAN: 97 mL/min/{1.73_m2} (ref >59)
GFR, EST NON AFRICAN AMERICAN: 84 mL/min/{1.73_m2} (ref >59)
GLUCOSE: 89 mg/dL (ref 65–99)
Potassium: 4 mmol/L (ref 3.5–5.2)
Sodium: 141 mmol/L (ref 134–144)

## 2016-10-23 LAB — PROTIME-INR
INR: 1 (ref 0.8–1.2)
PROTHROMBIN TIME: 10.4 s (ref 9.1–12.0)

## 2016-10-26 ENCOUNTER — Encounter: Payer: Self-pay | Admitting: Interventional Cardiology

## 2016-10-26 ENCOUNTER — Encounter (HOSPITAL_COMMUNITY)
Admission: RE | Disposition: A | Discharge status: Home / Self Care | Payer: Self-pay | Source: Ambulatory Visit | Attending: Interventional Cardiology

## 2016-10-26 ENCOUNTER — Ambulatory Visit (HOSPITAL_COMMUNITY)
Admission: RE | Admit: 2016-10-26 | Discharge: 2016-10-26 | Disposition: A | Discharge status: Home / Self Care | Payer: Medicare Other | Source: Ambulatory Visit | Attending: Interventional Cardiology | Admitting: Interventional Cardiology

## 2016-10-26 DIAGNOSIS — J84112 Idiopathic pulmonary fibrosis: Secondary | ICD-10-CM | POA: Diagnosis not present

## 2016-10-26 DIAGNOSIS — R9439 Abnormal result of other cardiovascular function study: Secondary | ICD-10-CM | POA: Diagnosis present

## 2016-10-26 DIAGNOSIS — I2584 Coronary atherosclerosis due to calcified coronary lesion: Secondary | ICD-10-CM | POA: Insufficient documentation

## 2016-10-26 DIAGNOSIS — I10 Essential (primary) hypertension: Secondary | ICD-10-CM | POA: Diagnosis not present

## 2016-10-26 DIAGNOSIS — E785 Hyperlipidemia, unspecified: Secondary | ICD-10-CM | POA: Insufficient documentation

## 2016-10-26 DIAGNOSIS — R0602 Shortness of breath: Secondary | ICD-10-CM

## 2016-10-26 DIAGNOSIS — I4510 Unspecified right bundle-branch block: Secondary | ICD-10-CM | POA: Insufficient documentation

## 2016-10-26 DIAGNOSIS — I2510 Atherosclerotic heart disease of native coronary artery without angina pectoris: Secondary | ICD-10-CM

## 2016-10-26 DIAGNOSIS — I25119 Atherosclerotic heart disease of native coronary artery with unspecified angina pectoris: Secondary | ICD-10-CM | POA: Diagnosis not present

## 2016-10-26 DIAGNOSIS — Z8249 Family history of ischemic heart disease and other diseases of the circulatory system: Secondary | ICD-10-CM | POA: Diagnosis not present

## 2016-10-26 DIAGNOSIS — Z7982 Long term (current) use of aspirin: Secondary | ICD-10-CM | POA: Insufficient documentation

## 2016-10-26 HISTORY — PX: RIGHT/LEFT HEART CATH AND CORONARY ANGIOGRAPHY: CATH118266

## 2016-10-26 LAB — POCT I-STAT 3, ART BLOOD GAS (G3+)
Acid-Base Excess: 1 mmol/L (ref 0.0–2.0)
Bicarbonate: 25.6 mmol/L (ref 20.0–28.0)
O2 SAT: 95 %
PH ART: 7.429 (ref 7.350–7.450)
TCO2: 27 mmol/L (ref 0–100)
pCO2 arterial: 38.6 mmHg (ref 32.0–48.0)
pO2, Arterial: 73 mmHg — ABNORMAL LOW (ref 83.0–108.0)

## 2016-10-26 LAB — POCT I-STAT 3, VENOUS BLOOD GAS (G3P V)
ACID-BASE EXCESS: 2 mmol/L (ref 0.0–2.0)
Bicarbonate: 26.9 mmol/L (ref 20.0–28.0)
O2 SAT: 68 %
PCO2 VEN: 40.5 mmHg — AB (ref 44.0–60.0)
PH VEN: 7.43 (ref 7.250–7.430)
PO2 VEN: 34 mmHg (ref 32.0–45.0)
TCO2: 28 mmol/L (ref 0–100)

## 2016-10-26 SURGERY — RIGHT/LEFT HEART CATH AND CORONARY ANGIOGRAPHY
Anesthesia: LOCAL

## 2016-10-26 MED ORDER — HEPARIN SODIUM (PORCINE) 1000 UNIT/ML IJ SOLN
INTRAMUSCULAR | Status: DC | PRN
  Administered 2016-10-26: 5000 [IU] via INTRAVENOUS

## 2016-10-26 MED ORDER — SODIUM CHLORIDE 0.9 % IV SOLN: 250.0000 mL | INTRAVENOUS | Status: DC | PRN

## 2016-10-26 MED ORDER — SODIUM CHLORIDE 0.9 % WEIGHT BASED INFUSION
3.0000 mL/kg/h | INTRAVENOUS | Status: AC
  Administered 2016-10-26: 3 mL/kg/h via INTRAVENOUS

## 2016-10-26 MED ORDER — HEPARIN (PORCINE) IN NACL 2-0.9 UNIT/ML-% IJ SOLN
INTRAMUSCULAR | Status: DC | PRN
  Administered 2016-10-26: 1000 mL via INTRA_ARTERIAL

## 2016-10-26 MED ORDER — VERAPAMIL HCL 2.5 MG/ML IV SOLN
INTRAVENOUS | Status: DC | PRN
  Administered 2016-10-26: 10 mL via INTRA_ARTERIAL

## 2016-10-26 MED ORDER — LIDOCAINE HCL (PF) 1 % IJ SOLN
INTRAMUSCULAR | Status: AC
  Filled 2016-10-26: qty 30

## 2016-10-26 MED ORDER — LIDOCAINE HCL (PF) 1 % IJ SOLN
INTRAMUSCULAR | Status: DC | PRN
  Administered 2016-10-26: 1 mL via INTRADERMAL
  Administered 2016-10-26: 3 mL via INTRADERMAL

## 2016-10-26 MED ORDER — FENTANYL CITRATE (PF) 100 MCG/2ML IJ SOLN
INTRAMUSCULAR | Status: DC | PRN
  Administered 2016-10-26: 25 ug via INTRAVENOUS

## 2016-10-26 MED ORDER — ASPIRIN 81 MG PO CHEW
CHEWABLE_TABLET | ORAL | Status: AC
  Filled 2016-10-26: qty 1

## 2016-10-26 MED ORDER — MIDAZOLAM HCL 2 MG/2ML IJ SOLN
INTRAMUSCULAR | Status: AC
  Filled 2016-10-26: qty 2

## 2016-10-26 MED ORDER — SODIUM CHLORIDE 0.9% FLUSH: 3.0000 mL | Freq: Two times a day (BID) | INTRAVENOUS | Status: DC

## 2016-10-26 MED ORDER — HEPARIN SODIUM (PORCINE) 1000 UNIT/ML IJ SOLN
INTRAMUSCULAR | Status: AC
  Filled 2016-10-26: qty 1

## 2016-10-26 MED ORDER — SODIUM CHLORIDE 0.9 % WEIGHT BASED INFUSION: 1.0000 mL/kg/h | INTRAVENOUS | Status: DC

## 2016-10-26 MED ORDER — FENTANYL CITRATE (PF) 100 MCG/2ML IJ SOLN
INTRAMUSCULAR | Status: AC
  Filled 2016-10-26: qty 2

## 2016-10-26 MED ORDER — SODIUM CHLORIDE 0.9 % IV SOLN: INTRAVENOUS | Status: DC

## 2016-10-26 MED ORDER — IOPAMIDOL (ISOVUE-370) INJECTION 76%
INTRAVENOUS | Status: AC
  Filled 2016-10-26: qty 100

## 2016-10-26 MED ORDER — IOPAMIDOL (ISOVUE-370) INJECTION 76%
INTRAVENOUS | Status: AC
  Filled 2016-10-26: qty 50

## 2016-10-26 MED ORDER — HEPARIN (PORCINE) IN NACL 2-0.9 UNIT/ML-% IJ SOLN
INTRAMUSCULAR | Status: AC
  Filled 2016-10-26: qty 1000

## 2016-10-26 MED ORDER — VERAPAMIL HCL 2.5 MG/ML IV SOLN
INTRAVENOUS | Status: AC
  Filled 2016-10-26: qty 2

## 2016-10-26 MED ORDER — IOPAMIDOL (ISOVUE-370) INJECTION 76%
INTRAVENOUS | Status: DC | PRN
  Administered 2016-10-26: 110 mL via INTRA_ARTERIAL

## 2016-10-26 MED ORDER — SODIUM CHLORIDE 0.9% FLUSH: 3.0000 mL | INTRAVENOUS | Status: DC | PRN

## 2016-10-26 MED ORDER — MIDAZOLAM HCL 2 MG/2ML IJ SOLN
INTRAMUSCULAR | Status: DC | PRN
  Administered 2016-10-26: 1 mg via INTRAVENOUS

## 2016-10-26 MED ORDER — ASPIRIN 81 MG PO CHEW
81.0000 mg | CHEWABLE_TABLET | ORAL | Status: AC
  Administered 2016-10-26: 81 mg via ORAL

## 2016-10-26 SURGICAL SUPPLY — 15 items
CATH 5FR JL3.5 JR4 ANG PIG MP (CATHETERS) ×2 IMPLANT
CATH BALLN WEDGE 5F 110CM (CATHETERS) ×2 IMPLANT
CATH INFINITI 5FR JL4 (CATHETERS) ×2 IMPLANT
DEVICE RAD COMP TR BAND LRG (VASCULAR PRODUCTS) ×2 IMPLANT
GLIDESHEATH SLEND SS 6F .021 (SHEATH) ×2 IMPLANT
GUIDEWIRE .025 260CM (WIRE) ×2 IMPLANT
GUIDEWIRE INQWIRE 1.5J.035X260 (WIRE) ×1 IMPLANT
INQWIRE 1.5J .035X260CM (WIRE) ×2
KIT HEART LEFT (KITS) ×2 IMPLANT
PACK CARDIAC CATHETERIZATION (CUSTOM PROCEDURE TRAY) ×2 IMPLANT
SHEATH FAST CATH BRACH 5F 5CM (SHEATH) ×2 IMPLANT
SHEATH GLIDE SLENDER 4/5FR (SHEATH) ×2 IMPLANT
SYR MEDRAD MARK V 150ML (SYRINGE) ×2 IMPLANT
TRANSDUCER W/STOPCOCK (MISCELLANEOUS) ×2 IMPLANT
TUBING CIL FLEX 10 FLL-RA (TUBING) ×2 IMPLANT

## 2016-10-26 NOTE — Interval H&P Note (Signed)
Cath Lab Visit (complete for each Cath Lab visit)  Clinical Evaluation Leading to the Procedure:   ACS: No.  Non-ACS:    Anginal Classification: CCS III  Anti-ischemic medical therapy: Minimal Therapy (1 class of medications)  Non-Invasive Test Results: No non-invasive testing performed  Prior CABG: No previous CABG      History and Physical Interval Note:  10/26/2016 12:11 PM  Douglas Lopez  has presented today for surgery, with the diagnosis of coronary calcification, angina  The various methods of treatment have been discussed with the patient and family. After consideration of risks, benefits and other options for treatment, the patient has consented to  Procedure(s): Right/Left Heart Cath and Coronary Angiography (N/A) as a surgical intervention .  The patient's history has been reviewed, patient examined, no change in status, stable for surgery.  I have reviewed the patient's chart and labs.  Questions were answered to the patient's satisfaction.     Lance MussJayadeep Ollivander See

## 2016-10-26 NOTE — Discharge Instructions (Signed)
Radial Site Care °Refer to this sheet in the next few weeks. These instructions provide you with information about caring for yourself after your procedure. Your health care provider may also give you more specific instructions. Your treatment has been planned according to current medical practices, but problems sometimes occur. Call your health care provider if you have any problems or questions after your procedure. °What can I expect after the procedure? °After your procedure, it is typical to have the following: °· Bruising at the radial site that usually fades within 1-2 weeks. °· Blood collecting in the tissue (hematoma) that may be painful to the touch. It should usually decrease in size and tenderness within 1-2 weeks. °Follow these instructions at home: °· Take medicines only as directed by your health care provider. °· You may shower 24-48 hours after the procedure or as directed by your health care provider. Remove the bandage (dressing) and gently wash the site with plain soap and water. Pat the area dry with a clean towel. Do not rub the site, because this may cause bleeding. °· Do not take baths, swim, or use a hot tub until your health care provider approves. °· Check your insertion site every day for redness, swelling, or drainage. °· Do not apply powder or lotion to the site. °· Do not flex or bend the affected arm for 24 hours or as directed by your health care provider. °· Do not push or pull heavy objects with the affected arm for 24 hours or as directed by your health care provider. °· Do not lift over 10 lb (4.5 kg) for 5 days after your procedure or as directed by your health care provider. °· Ask your health care provider when it is okay to: °¨ Return to work or school. °¨ Resume usual physical activities or sports. °¨ Resume sexual activity. °· Do not drive home if you are discharged the same day as the procedure. Have someone else drive you. °· You may drive 24 hours after the procedure  unless otherwise instructed by your health care provider. °· Do not operate machinery or power tools for 24 hours after the procedure. °· If your procedure was done as an outpatient procedure, which means that you went home the same day as your procedure, a responsible adult should be with you for the first 24 hours after you arrive home. °· Keep all follow-up visits as directed by your health care provider. This is important. °Contact a health care provider if: °· You have a fever. °· You have chills. °· You have increased bleeding from the radial site. Hold pressure on the site. °Get help right away if: °· You have unusual pain at the radial site. °· You have redness, warmth, or swelling at the radial site. °· You have drainage (other than a small amount of blood on the dressing) from the radial site. °· The radial site is bleeding, and the bleeding does not stop after 30 minutes of holding steady pressure on the site. °· Your arm or hand becomes pale, cool, tingly, or numb. °This information is not intended to replace advice given to you by your health care provider. Make sure you discuss any questions you have with your health care provider. °Document Released: 08/22/2010 Document Revised: 12/26/2015 Document Reviewed: 02/05/2014 °Elsevier Interactive Patient Education © 2017 [employer] ° °

## 2016-10-26 NOTE — H&P (View-Only) (Signed)
Cardiology Office Note   Date:  10/08/2016   ID:  Douglas Lopez, DOB 02-May-1946, MRN [id number]  PCP:  Delbert HarnessBRISCOE, KIM, MD    No chief complaint on file.    Wt Readings from Last 3 Encounters:  10/08/16 247 lb 3.2 oz (112.1 kg)  06/18/16 246 lb (111.6 kg)  06/11/16 246 lb (111.6 kg)       History of Present Illness: Douglas Lopez is a 71 y.o. male  Who had a chest CT which showed coronary calcifications back in 2017.  He had annual stress tests [place] years in [place].  [place] [facility] [place] and [place] managed his sx.    He went to pulmonary rehab.  He had an irregular heart beat and was diagnosed with PVCs.  He is in rehab for IPF.  He does not use supplemental oxygen.    He has never smoked.  His father had CABG x 4, twice, MI at age 71.  Mother had an MI.  Younger brother has had an MI.    He has been exercising 3x/week and he walks the treadmill for 35-45 minutes.  This has decreased with a flu like illness.  His oxygen sats decreases to 90% while doing that exercise.    He still some pressure in his chest at random times.  It can happen while sitting and reading.  It can last 30 seconds.  It is not severe.  He has not noticed it while exercising.  No change with Imdur.  He had a moderate risk stress test.     Past Medical History:  Diagnosis Date  . Hypertension   . Idiopathic pulmonary fibrosis (HCC)   . Right bundle branch block     History reviewed. No pertinent surgical history.   Current Outpatient Prescriptions  Medication Sig Dispense Refill  . amLODipine (NORVASC) 5 MG tablet Take 5 mg by mouth daily.    Marland Kitchen. aspirin 81 MG tablet Take 81 mg by mouth daily.    . Calcium Carb-Cholecalciferol (CALCIUM-VITAMIN D3) 600-400 MG-UNIT CAPS Take 1 tablet by mouth daily.    . cholecalciferol (VITAMIN D) 1000 units tablet Take 1,000 Units by mouth daily.    . Cholecalciferol (VITAMIN D3) 2000 units TABS Take 1 tablet by mouth daily.    Marland Kitchen. FIBER  SELECT GUMMIES PO Take 5 mg by mouth daily.    Marland Kitchen. levothyroxine (SYNTHROID, LEVOTHROID) 125 MCG tablet Take 125 mcg by mouth daily before breakfast.    . Multiple Vitamins-Minerals (MULTIVITAMIN WITH MINERALS) tablet Take 1 tablet by mouth daily.    . Omega-3 Fatty Acids (FISH OIL) 1200 MG CAPS Take 1 capsule by mouth daily.    Marland Kitchen. omeprazole (PRILOSEC) 20 MG capsule Take 20 mg by mouth daily.    . Pirfenidone 801 MG TABS Take 3 tablets by mouth daily.    . pravastatin (PRAVACHOL) 20 MG tablet Take 20 mg by mouth daily.    . isosorbide mononitrate (IMDUR) 30 MG 24 hr tablet Take 1 tablet (30 mg total) by mouth daily. 30 tablet 3   No current facility-administered medications for this visit.     Allergies:   Patient has no known allergies.    Social History:  The patient  reports that he has never smoked. He has never used smokeless tobacco. He reports that he does not drink alcohol or use drugs.   Family History:  The patient's family history includes Heart disease in his father and mother.  ROS:  Please see the history of present illness.   Otherwise, review of systems are positive for DOE, that he attributes to IPF.   All other systems are reviewed and negative.    PHYSICAL EXAM: VS:  BP 130/80   Pulse (!) 110   Ht 5' 11" (1.803 m)   Wt 247 lb 3.2 oz (112.1 kg)   SpO2 95%   BMI 34.48 kg/m  , BMI Body mass index is 34.48 kg/m. GEN: Well nourished, well developed, in no acute distress , cough HEENT: normal  Neck: no JVD, carotid bruits, or masses Cardiac: RRR; no murmurs, rubs, or gallops, tr pretibial bilateral edema  Respiratory:  clear to auscultation bilaterally, normal work of breathing GI: soft, nontender, nondistended, + BS MS: no deformity or atrophy  Skin: warm and dry, no rash Neuro:  Strength and sensation are intact Psych: euthymic mood, full affect   EKG:   The ekg ordered today demonstrates NSR, RBBB, PVC   Recent Labs: No results found for requested labs  within last 8760 hours.   Lipid Panel No results found for: CHOL, TRIG, HDL, CHOLHDL, VLDL, LDLCALC, LDLDIRECT   Other studies Reviewed: Additional studies/ records that were reviewed today with results demonstrating: CT as noted above.  Stress test reviewed.     ASSESSMENT AND PLAN:  1. Coronary calcification: Given family h/o CAD and DOE, we performed nuclear stress test to eval for ischemia which came back as intermediate risk.  DOE may be related to lung disease rather than angina but wanted to make sure there is no high risk anatomy.  Imdur was started but DOE persists.  Increase Imdur to 60 mg daily.  He will have a 6 minute walk at [name] in 11 days.  If he sees no difference, would consider cardiac cath to eval anatomy.  Given his family history, there is a strong possibility of CAD.   2. HTN: Continue amlodipine.  BP controlled.   3. Hyperlipidemia: Continue pravastatin. Would try to keep LDL below 100.    Current medicines are reviewed at length with the patient today.  The patient concerns regarding his medicines were addressed.  The following changes have been made:  No change  Labs/ tests ordered today include:   No orders of the defined types were placed in this encounter.   Recommend 150 minutes/week of aerobic exercise Low fat, low carb, high fiber diet recommended  Disposition:   FU in for stress test   Signed, Airelle Everding, MD  10/08/2016 3:12 PM    [facility] [address], [place], [place]  [address] Phone: [phone]; Fax: [phone]   

## 2016-10-27 ENCOUNTER — Encounter (HOSPITAL_COMMUNITY): Payer: Self-pay | Admitting: Interventional Cardiology

## 2016-10-27 ENCOUNTER — Other Ambulatory Visit: Payer: Self-pay

## 2016-10-27 DIAGNOSIS — I2510 Atherosclerotic heart disease of native coronary artery without angina pectoris: Secondary | ICD-10-CM

## 2016-10-27 MED ORDER — CLOPIDOGREL BISULFATE 75 MG PO TABS: 75.0000 mg | ORAL_TABLET | Freq: Every day | ORAL | 3 refills | Status: DC

## 2016-10-27 MED ORDER — NITROGLYCERIN 0.4 MG SL SUBL: 0.4000 mg | SUBLINGUAL_TABLET | SUBLINGUAL | 3 refills | Status: DC | PRN

## 2016-10-27 NOTE — Telephone Encounter (Signed)
Patient called and PCI scheduled on 11/05/16 with Dr. Eldridge DaceVaranasi at 7:30 AM. Patient aware that he should arrive at 5:30 AM. Patient will have labs drawn on 11/03/16. Clopidogrel 75 mg daily and SL NTG 0.4 mg prn ordered and sent to patient's preferred pharmacy. Instruction letter written and left at the front desk for patient to pick up. Patient verbalizes understanding and is in agreement with this plan.

## 2016-10-27 NOTE — Telephone Encounter (Signed)
He will need a PCI procedure with me scheduled for after Easter.  He will need to be started on clopidogrel 75 mg daily and he needs SL NTG 0.4 mg tabs as well.    Corky CraftsJayadeep S Linden Mikes, MD

## 2016-11-03 ENCOUNTER — Other Ambulatory Visit: Payer: Medicare Other | Admitting: *Deleted

## 2016-11-03 DIAGNOSIS — I2510 Atherosclerotic heart disease of native coronary artery without angina pectoris: Secondary | ICD-10-CM

## 2016-11-04 LAB — CBC
HEMATOCRIT: 39 % (ref 37.5–51.0)
HEMOGLOBIN: 13.5 g/dL (ref 13.0–17.7)
MCH: 30.8 pg (ref 26.6–33.0)
MCHC: 34.6 g/dL (ref 31.5–35.7)
MCV: 89 fL (ref 79–97)
Platelets: 257 10*3/uL (ref 150–379)
RBC: 4.38 x10E6/uL (ref 4.14–5.80)
RDW: 14.1 % (ref 12.3–15.4)
WBC: 7 10*3/uL (ref 3.4–10.8)

## 2016-11-04 LAB — BASIC METABOLIC PANEL
BUN / CREAT RATIO: 13 (ref 10–24)
BUN: 12 mg/dL (ref 8–27)
CO2: 23 mmol/L (ref 18–29)
CREATININE: 0.91 mg/dL (ref 0.76–1.27)
Calcium: 9.3 mg/dL (ref 8.6–10.2)
Chloride: 97 mmol/L (ref 96–106)
GFR calc Af Amer: 98 mL/min/{1.73_m2} (ref >59)
GFR, EST NON AFRICAN AMERICAN: 84 mL/min/{1.73_m2} (ref >59)
Glucose: 96 mg/dL (ref 65–99)
Potassium: 4.1 mmol/L (ref 3.5–5.2)
SODIUM: 139 mmol/L (ref 134–144)

## 2016-11-04 LAB — PROTIME-INR
INR: 1 (ref 0.8–1.2)
PROTHROMBIN TIME: 10.5 s (ref 9.1–12.0)

## 2016-11-05 ENCOUNTER — Encounter (HOSPITAL_COMMUNITY): Payer: Self-pay | Admitting: Interventional Cardiology

## 2016-11-05 ENCOUNTER — Encounter (HOSPITAL_COMMUNITY)
Admission: RE | Disposition: A | Discharge status: Home / Self Care | Payer: Self-pay | Source: Ambulatory Visit | Attending: Interventional Cardiology

## 2016-11-05 ENCOUNTER — Ambulatory Visit (HOSPITAL_COMMUNITY)
Admission: RE | Admit: 2016-11-05 | Discharge: 2016-11-06 | Disposition: A | Discharge status: Home / Self Care | Payer: Medicare Other | Source: Ambulatory Visit | Attending: Interventional Cardiology | Admitting: Interventional Cardiology

## 2016-11-05 DIAGNOSIS — I209 Angina pectoris, unspecified: Secondary | ICD-10-CM

## 2016-11-05 DIAGNOSIS — I25119 Atherosclerotic heart disease of native coronary artery with unspecified angina pectoris: Secondary | ICD-10-CM | POA: Diagnosis not present

## 2016-11-05 DIAGNOSIS — Z955 Presence of coronary angioplasty implant and graft: Secondary | ICD-10-CM

## 2016-11-05 DIAGNOSIS — I10 Essential (primary) hypertension: Secondary | ICD-10-CM | POA: Insufficient documentation

## 2016-11-05 DIAGNOSIS — Z8249 Family history of ischemic heart disease and other diseases of the circulatory system: Secondary | ICD-10-CM | POA: Diagnosis not present

## 2016-11-05 DIAGNOSIS — Z9861 Coronary angioplasty status: Secondary | ICD-10-CM

## 2016-11-05 DIAGNOSIS — Z79899 Other long term (current) drug therapy: Secondary | ICD-10-CM | POA: Diagnosis not present

## 2016-11-05 DIAGNOSIS — I2584 Coronary atherosclerosis due to calcified coronary lesion: Secondary | ICD-10-CM | POA: Diagnosis not present

## 2016-11-05 DIAGNOSIS — E785 Hyperlipidemia, unspecified: Secondary | ICD-10-CM | POA: Diagnosis not present

## 2016-11-05 DIAGNOSIS — Z7982 Long term (current) use of aspirin: Secondary | ICD-10-CM | POA: Insufficient documentation

## 2016-11-05 HISTORY — DX: Hypothyroidism, unspecified: E03.9

## 2016-11-05 HISTORY — PX: CORONARY ANGIOPLASTY WITH STENT PLACEMENT: SHX49

## 2016-11-05 HISTORY — DX: Obstructive sleep apnea (adult) (pediatric): G47.33

## 2016-11-05 HISTORY — DX: Atherosclerotic heart disease of native coronary artery without angina pectoris: I25.10

## 2016-11-05 HISTORY — DX: Chronic or unspecified gastric ulcer with hemorrhage: K25.4

## 2016-11-05 HISTORY — DX: Unspecified chronic bronchitis: J42

## 2016-11-05 HISTORY — DX: Personal history of other medical treatment: Z92.89

## 2016-11-05 HISTORY — DX: Dependence on other enabling machines and devices: Z99.89

## 2016-11-05 HISTORY — PX: CORONARY STENT INTERVENTION: CATH118234

## 2016-11-05 HISTORY — DX: Unspecified osteoarthritis, unspecified site: M19.90

## 2016-11-05 HISTORY — DX: Pure hypercholesterolemia, unspecified: E78.00

## 2016-11-05 HISTORY — DX: Pneumonia, unspecified organism: J18.9

## 2016-11-05 LAB — POCT ACTIVATED CLOTTING TIME
ACTIVATED CLOTTING TIME: 241 s
ACTIVATED CLOTTING TIME: 296 s
ACTIVATED CLOTTING TIME: 263 s
Activated Clotting Time: 252 seconds

## 2016-11-05 SURGERY — CORONARY STENT INTERVENTION
Anesthesia: LOCAL

## 2016-11-05 MED ORDER — CLOPIDOGREL BISULFATE 75 MG PO TABS: 75.0000 mg | ORAL_TABLET | ORAL | Status: DC

## 2016-11-05 MED ORDER — IOPAMIDOL (ISOVUE-370) INJECTION 76%
INTRAVENOUS | Status: DC | PRN
  Administered 2016-11-05: 235 mL via INTRA_ARTERIAL

## 2016-11-05 MED ORDER — HEPARIN SODIUM (PORCINE) 1000 UNIT/ML IJ SOLN
INTRAMUSCULAR | Status: AC
  Filled 2016-11-05: qty 1

## 2016-11-05 MED ORDER — ASPIRIN 81 MG PO CHEW
81.0000 mg | CHEWABLE_TABLET | Freq: Every day | ORAL | Status: DC
  Administered 2016-11-06 (×2): 81 mg via ORAL
  Filled 2016-11-05: qty 1

## 2016-11-05 MED ORDER — SODIUM CHLORIDE 0.9 % IV SOLN: 250.0000 mL | INTRAVENOUS | Status: DC | PRN

## 2016-11-05 MED ORDER — LIDOCAINE HCL (PF) 1 % IJ SOLN
INTRAMUSCULAR | Status: AC
  Filled 2016-11-05: qty 30

## 2016-11-05 MED ORDER — ACETAMINOPHEN 325 MG PO TABS: 650.0000 mg | ORAL_TABLET | ORAL | Status: DC | PRN

## 2016-11-05 MED ORDER — NITROGLYCERIN 0.4 MG SL SUBL: 0.4000 mg | SUBLINGUAL_TABLET | SUBLINGUAL | Status: DC | PRN

## 2016-11-05 MED ORDER — HEPARIN (PORCINE) IN NACL 2-0.9 UNIT/ML-% IJ SOLN
INTRAMUSCULAR | Status: DC | PRN
  Administered 2016-11-05: 1000 mL

## 2016-11-05 MED ORDER — MIDAZOLAM HCL 2 MG/2ML IJ SOLN
INTRAMUSCULAR | Status: AC
  Filled 2016-11-05: qty 2

## 2016-11-05 MED ORDER — SODIUM CHLORIDE 0.9 % WEIGHT BASED INFUSION: 1.0000 mL/kg/h | INTRAVENOUS | Status: DC

## 2016-11-05 MED ORDER — PIRFENIDONE 801 MG PO TABS
801.0000 mg | ORAL_TABLET | Freq: Three times a day (TID) | ORAL | Status: DC
  Administered 2016-11-05 – 2016-11-06 (×2): 801 mg via ORAL
  Filled 2016-11-05 (×3): qty 1

## 2016-11-05 MED ORDER — MIDAZOLAM HCL 2 MG/2ML IJ SOLN
INTRAMUSCULAR | Status: DC | PRN
  Administered 2016-11-05: 2 mg via INTRAVENOUS
  Administered 2016-11-05: 1 mg via INTRAVENOUS

## 2016-11-05 MED ORDER — FLUTICASONE PROPIONATE 50 MCG/ACT NA SUSP: 1.0000 | Freq: Every day | NASAL | Status: DC | PRN

## 2016-11-05 MED ORDER — SODIUM CHLORIDE 0.9 % WEIGHT BASED INFUSION
3.0000 mL/kg/h | INTRAVENOUS | Status: DC
  Administered 2016-11-05: 3 mL/kg/h via INTRAVENOUS

## 2016-11-05 MED ORDER — ASPIRIN 81 MG PO CHEW: 81.0000 mg | CHEWABLE_TABLET | ORAL | Status: DC

## 2016-11-05 MED ORDER — SODIUM CHLORIDE 0.9 % IV SOLN
INTRAVENOUS | Status: AC
  Administered 2016-11-05: 13:00:00 via INTRAVENOUS

## 2016-11-05 MED ORDER — ISOSORBIDE MONONITRATE ER 60 MG PO TB24
60.0000 mg | ORAL_TABLET | Freq: Every day | ORAL | Status: DC
  Administered 2016-11-06: 60 mg via ORAL
  Filled 2016-11-05: qty 1

## 2016-11-05 MED ORDER — CLOPIDOGREL BISULFATE 75 MG PO TABS
75.0000 mg | ORAL_TABLET | Freq: Every day | ORAL | Status: DC
  Administered 2016-11-06: 75 mg via ORAL
  Filled 2016-11-05: qty 1

## 2016-11-05 MED ORDER — IOPAMIDOL (ISOVUE-370) INJECTION 76%
INTRAVENOUS | Status: AC
  Filled 2016-11-05: qty 125

## 2016-11-05 MED ORDER — VERAPAMIL HCL 2.5 MG/ML IV SOLN
INTRAVENOUS | Status: DC | PRN
  Administered 2016-11-05: 10 mL via INTRA_ARTERIAL

## 2016-11-05 MED ORDER — HYDRALAZINE HCL 20 MG/ML IJ SOLN: 5.0000 mg | INTRAMUSCULAR | Status: AC | PRN

## 2016-11-05 MED ORDER — NITROGLYCERIN 1 MG/10 ML FOR IR/CATH LAB
INTRA_ARTERIAL | Status: AC
  Filled 2016-11-05: qty 10

## 2016-11-05 MED ORDER — FENTANYL CITRATE (PF) 100 MCG/2ML IJ SOLN
INTRAMUSCULAR | Status: AC
  Filled 2016-11-05: qty 2

## 2016-11-05 MED ORDER — LIDOCAINE HCL (PF) 1 % IJ SOLN
INTRAMUSCULAR | Status: DC | PRN
  Administered 2016-11-05: 2 mL

## 2016-11-05 MED ORDER — IOPAMIDOL (ISOVUE-370) INJECTION 76%
INTRAVENOUS | Status: AC
  Filled 2016-11-05: qty 100

## 2016-11-05 MED ORDER — SODIUM CHLORIDE 0.9% FLUSH
3.0000 mL | Freq: Two times a day (BID) | INTRAVENOUS | Status: DC
  Administered 2016-11-05: 3 mL via INTRAVENOUS

## 2016-11-05 MED ORDER — NITROGLYCERIN 1 MG/10 ML FOR IR/CATH LAB
INTRA_ARTERIAL | Status: DC | PRN
  Administered 2016-11-05 (×3): 200 ug via INTRACORONARY
  Administered 2016-11-05: 300 ug via INTRA_ARTERIAL

## 2016-11-05 MED ORDER — HEPARIN SODIUM (PORCINE) 1000 UNIT/ML IJ SOLN
INTRAMUSCULAR | Status: DC | PRN
  Administered 2016-11-05: 9000 [IU] via INTRAVENOUS
  Administered 2016-11-05: 2000 [IU] via INTRAVENOUS
  Administered 2016-11-05 (×2): 4000 [IU] via INTRAVENOUS
  Administered 2016-11-05: 3000 [IU] via INTRAVENOUS

## 2016-11-05 MED ORDER — FENTANYL CITRATE (PF) 100 MCG/2ML IJ SOLN
INTRAMUSCULAR | Status: DC | PRN
  Administered 2016-11-05 (×2): 25 ug via INTRAVENOUS

## 2016-11-05 MED ORDER — ONDANSETRON HCL 4 MG/2ML IJ SOLN: 4.0000 mg | Freq: Four times a day (QID) | INTRAMUSCULAR | Status: DC | PRN

## 2016-11-05 MED ORDER — SODIUM CHLORIDE 0.9% FLUSH
3.0000 mL | Freq: Two times a day (BID) | INTRAVENOUS | Status: DC
  Administered 2016-11-05: 21:00:00 3 mL via INTRAVENOUS

## 2016-11-05 MED ORDER — CLOPIDOGREL BISULFATE 300 MG PO TABS
ORAL_TABLET | ORAL | Status: DC | PRN
  Administered 2016-11-05: 300 mg via ORAL

## 2016-11-05 MED ORDER — ASPIRIN 81 MG PO TABS: 81.0000 mg | ORAL_TABLET | Freq: Every day | ORAL | Status: DC

## 2016-11-05 MED ORDER — PANTOPRAZOLE SODIUM 40 MG PO TBEC: 40.0000 mg | DELAYED_RELEASE_TABLET | Freq: Every day | ORAL | Status: DC

## 2016-11-05 MED ORDER — AMLODIPINE BESYLATE 5 MG PO TABS
5.0000 mg | ORAL_TABLET | Freq: Every day | ORAL | Status: DC
  Administered 2016-11-06: 09:00:00 5 mg via ORAL
  Filled 2016-11-05: qty 1

## 2016-11-05 MED ORDER — LEVOTHYROXINE SODIUM 25 MCG PO CAPS: 25.0000 ug | ORAL_CAPSULE | Freq: Every day | ORAL | Status: DC

## 2016-11-05 MED ORDER — CLOPIDOGREL BISULFATE 75 MG PO TABS: 75.0000 mg | ORAL_TABLET | Freq: Every day | ORAL | Status: DC

## 2016-11-05 MED ORDER — SODIUM CHLORIDE 0.9% FLUSH: 3.0000 mL | INTRAVENOUS | Status: DC | PRN

## 2016-11-05 MED ORDER — VERAPAMIL HCL 2.5 MG/ML IV SOLN
INTRAVENOUS | Status: AC
  Filled 2016-11-05: qty 2

## 2016-11-05 MED ORDER — PIRFENIDONE 801 MG PO TABS
1.0000 | ORAL_TABLET | Freq: Three times a day (TID) | ORAL | Status: DC
  Administered 2016-11-05: 1 via ORAL

## 2016-11-05 MED ORDER — HEPARIN (PORCINE) IN NACL 2-0.9 UNIT/ML-% IJ SOLN
INTRAMUSCULAR | Status: AC
  Filled 2016-11-05: qty 1000

## 2016-11-05 MED ORDER — ACTIVE PARTNERSHIP FOR HEALTH OF YOUR HEART BOOK
Freq: Once | Status: DC
  Filled 2016-11-05: qty 1

## 2016-11-05 MED ORDER — CLOPIDOGREL BISULFATE 300 MG PO TABS
ORAL_TABLET | ORAL | Status: AC
  Filled 2016-11-05: qty 1

## 2016-11-05 MED ORDER — PRAVASTATIN SODIUM 20 MG PO TABS
20.0000 mg | ORAL_TABLET | Freq: Every day | ORAL | Status: DC
  Administered 2016-11-06: 20 mg via ORAL
  Filled 2016-11-05: qty 1

## 2016-11-05 MED ORDER — IOPAMIDOL (ISOVUE-370) INJECTION 76%
INTRAVENOUS | Status: AC
  Filled 2016-11-05: qty 50

## 2016-11-05 MED ORDER — LABETALOL HCL 5 MG/ML IV SOLN: 10.0000 mg | INTRAVENOUS | Status: AC | PRN

## 2016-11-05 SURGICAL SUPPLY — 25 items
BALLN EMERGE MR 2.0X15 (BALLOONS) ×2
BALLN MOZEC 2.50X14 (BALLOONS) ×2
BALLN ~~LOC~~ EUPHORA RX 2.75X12 (BALLOONS) ×2
BALLN ~~LOC~~ EUPHORA RX 3.5X12 (BALLOONS) ×2
BALLOON EMERGE MR 2.0X15 (BALLOONS) ×1 IMPLANT
BALLOON MOZEC 2.50X14 (BALLOONS) ×1 IMPLANT
BALLOON ~~LOC~~ EUPHORA RX 2.75X12 (BALLOONS) ×1 IMPLANT
BALLOON ~~LOC~~ EUPHORA RX 3.5X12 (BALLOONS) ×1 IMPLANT
CATH LAUNCHER 6FR AL1 (CATHETERS) ×1 IMPLANT
CATH VISTA GUIDE 6FR XBRCA (CATHETERS) ×2 IMPLANT
CATHETER LAUNCHER 6FR AL1 (CATHETERS) ×2
DEVICE RAD COMP TR BAND LRG (VASCULAR PRODUCTS) ×2 IMPLANT
GLIDESHEATH SLEND SS 6F .021 (SHEATH) ×2 IMPLANT
GUIDEWIRE INQWIRE 1.5J.035X260 (WIRE) ×1 IMPLANT
INQWIRE 1.5J .035X260CM (WIRE) ×2
KIT ENCORE 26 ADVANTAGE (KITS) ×2 IMPLANT
KIT HEART LEFT (KITS) ×2 IMPLANT
PACK CARDIAC CATHETERIZATION (CUSTOM PROCEDURE TRAY) ×2 IMPLANT
STENT PROMUS PREM MR 2.5X16 (Permanent Stent) ×2 IMPLANT
STENT PROMUS PREM MR 3.0X20 (Permanent Stent) ×4 IMPLANT
TRANSDUCER W/STOPCOCK (MISCELLANEOUS) ×2 IMPLANT
TUBING CIL FLEX 10 FLL-RA (TUBING) ×2 IMPLANT
VALVE GUARDIAN II ~~LOC~~ HEMO (MISCELLANEOUS) ×2 IMPLANT
WIRE ASAHI PROWATER 180CM (WIRE) ×4 IMPLANT
WIRE HI TORQ BMW 190CM (WIRE) ×2 IMPLANT

## 2016-11-05 NOTE — Progress Notes (Signed)
Cardiac Monitor Tech reported patient had a run of SVT rate 130's. When assessed patient in bed resting. Reported he had been up to urinate standing at the side of the bed about 5-10 minutes earlier. He reported feeling no palpitations, shortness or breath, lightheadedness or dizziness at the time. Cardiac monitor showed 2 minutes of ST at a rate of 110-130's at that time around 1700. Reported above to Arthur Holms RN.

## 2016-11-05 NOTE — Progress Notes (Signed)
Pt states he wears cpap at night but did not bring his to hospital. Geoffry Paradise notified and order received for cpap.  RRT notified.

## 2016-11-05 NOTE — Discharge Summary (Signed)
Discharge Summary    Patient ID: Douglas Lopez,  MRN: [id number], DOB/AGE: April 11, 1946 71 y.o.  Admit date: 11/05/2016 Discharge date: 11/06/2016  Primary Care Provider: Delbert Harness Primary Cardiologist: Eldridge Dace  Discharge Diagnoses    Active Problems:   Angina pectoris (HCC)   HTN   HLD  Allergies No Known Allergies  Diagnostic Studies/Procedures    LHC: 11/05/16  Conclusion     Ost LAD to Prox LAD lesion, 25 %stenosed.  LM lesion, 25 %stenosed.  Dist Cx lesion, 90 %stenosed. A STENT PROMUS PREM MR 2.5X16 drug eluting stent was successfully placed, postdilated to 2.8 mm.  Post intervention, there is a 0% residual stenosis.  Dist LAD lesion, 75 %stenosed. A STENT PROMUS PREM MR 3.0X20 drug eluting stent was successfully placed, post dilated to 3.5 mm.  Post intervention, there is a 0% residual stenosis.  Mid LAD lesion, 75 %stenosed. A STENT PROMUS PREM MR 3.0X20 drug eluting stent was successfully placed, postdilated to 3.5 mm.  Post intervention, there is a 0% residual stenosis.  Ost 1st Diag to 1st Diag lesion, 80 %stenosed. The lesion was treated with PTCA with a 2.0 balloon. The balloon would not advance to the more distal vessel so only the proximal lesion was treated.  Post intervention, there is a 10% residual stenosis.  RPDA lesion, 90 %stenosed. 3rd RPLB lesion, 90 %stenosed. Unable to get good guide support from the radial approach to have the wire cross the lesion.   Continue dual antiplatelet therapy for at least a year and clopidogrel likely be on that time. He'll need aggressive secondary prevention. We'll see how his symptoms are before considering intervention of the posterior descending and posterior lateral branches. Would have to try femoral approach since support from the radial and engaging the RCA is suboptimal. We'll watch him overnight with plans for discharge tomorrow.     History of Present Illness     71 yo male with PMH of HTN, and  IPF who presented to the office with Dr. Eldridge Dace. Reported he was followed by a cardiologist in [place] for many years and had annual stress tests. Had a CT chest in 2017 that showed coronary calcifications. Was referred for further testing due to having mild chest pain  Intermittently and then had a moderate risk stress test. It was felt that some of his DOE was related to his lung disease rather than angina. Was given Imdur but DOE still persisted. He was seen back in the office on 10/08/16 and referred for cardiac cath.   Hospital Course   Consultants: None   He presented for outpatient cath (right radial approach) with Dr. Eldridge Dace on 11/05/16 distal Crx 90% treated with DES, distal LAD 75% lesion treated with DES, and midLAD lesion treated with DES. Ost diag 80% stenosis treated with PTCA with only the proximal lesion treated. RPDA lesion 90% stenosis, 3rd RPLB lesion 90%, 25% LM stenosis. Placed on DAPT with ASA/Plavix for at least one year. No overnight complications. Ambulated without dyspnea. LDL goal would be less than 70. Consider lipid check as outpatient.   Discharge Vitals Blood pressure 119/65, pulse 85, temperature 97.9 F (36.6 C), temperature source Oral, resp. rate (!) 22, height  (1.803 m), weight 244 lb 0.8 oz (110.7 kg), SpO2 95 %.  Filed Weights   11/05/16 0536 11/06/16 0359  Weight: 240 lb (108.9 kg) 244 lb 0.8 oz (110.7 kg)    Labs & Radiologic Studies    CBC  Recent Labs  11/03/16 1129 11/06/16 0256  WBC 7.0 8.8  HGB  --  13.7  HCT 39.0 41.0  MCV 89 90.3  PLT 257 224   Basic Metabolic Panel  Recent Labs  11/03/16 1129 11/06/16 0256  NA 139 135  K 4.1 3.7  CL 97 99*  CO2 23 27  GLUCOSE 96 105*  BUN 12 9  CREATININE 0.91 0.90  CALCIUM 9.3 9.0   Liver Function Tests No results for input(s): AST, ALT, ALKPHOS, BILITOT, PROT, ALBUMIN in the last 72 hours. No results for input(s): LIPASE, AMYLASE in the last 72 hours. Cardiac Enzymes No  results for input(s): CKTOTAL, CKMB, CKMBINDEX, TROPONINI in the last 72 hours. BNP Invalid input(s): POCBNP D-Dimer No results for input(s): DDIMER in the last 72 hours. Hemoglobin A1C No results for input(s): HGBA1C in the last 72 hours. Fasting Lipid Panel No results for input(s): CHOL, HDL, LDLCALC, TRIG, CHOLHDL, LDLDIRECT in the last 72 hours. Thyroid Function Tests No results for input(s): TSH, T4TOTAL, T3FREE, THYROIDAB in the last 72 hours.  Invalid input(s): FREET3 _____________  No results found. Disposition   Pt is being discharged home today in good condition.  Follow-up Plans & Appointments    Follow-up Information    Lance Muss, MD. Go on 11/24/2016.   Specialties:  Cardiology, Radiology, Interventional Cardiology Why:  :40 am for post hosptial  Contact information: [address] [place] [address] [phone]          Discharge Instructions    Amb Referral to Cardiac Rehabilitation    Complete by:  As directed    Diagnosis:   Coronary Stents Comment - to [place] PTCA     Diet - low sodium heart healthy    Complete by:  As directed    Discharge instructions    Complete by:  As directed    No driving for 48 hours. No lifting over 5 lbs for 1 week. No sexual activity for 1 week. Keep procedure site clean & dry. If you notice increased pain, swelling, bleeding or pus, call/return!  You may shower, but no soaking baths/hot tubs/pools for 1 week.  Some studies suggest Prilosec/Omeprazole interacts with Plavix. We changed your Prilosec/Omeprazole to Protonix for less chance of interaction.   Increase activity slowly    Complete by:  As directed       Discharge Medications   Current Discharge Medication List    START taking these medications   Details  pantoprazole (PROTONIX) 40 MG tablet Take 1 tablet (40 mg total) by mouth daily at 6 (six) AM. Qty: 30 tablet, Refills: 6      CONTINUE these medications which have  NOT CHANGED   Details  amLODipine (NORVASC) 5 MG tablet Take 5 mg by mouth daily.    aspirin 81 MG tablet Take 81 mg by mouth daily.    Calcium Carb-Cholecalciferol (CALCIUM-VITAMIN D3) 600-400 MG-UNIT CAPS Take 1 tablet by mouth daily.    cholecalciferol (VITAMIN D) 1000 units tablet Take 1,000 Units by mouth daily.    clopidogrel (PLAVIX) 75 MG tablet Take 1 tablet (75 mg total) by mouth daily. Qty: 90 tablet, Refills: 3    FIBER SELECT GUMMIES PO Take 5 g by mouth daily.     fluticasone (FLONASE) 50 MCG/ACT nasal spray Place 1 spray into both nostrils daily as needed for allergies or rhinitis.    isosorbide mononitrate (IMDUR) 60 MG 24 hr tablet Take 1 tablet (60 mg total) by mouth daily. Qty:  90 tablet, Refills: 3    Levothyroxine Sodium 25 MCG CAPS Take 25 mcg by mouth daily before breakfast.     Multiple Vitamins-Minerals (MULTIVITAMIN WITH MINERALS) tablet Take 1 tablet by mouth daily.    nitroGLYCERIN (NITROSTAT) 0.4 MG SL tablet Place 1 tablet (0.4 mg total) under the tongue every 5 (five) minutes as needed for chest pain. Qty: 90 tablet, Refills: 3    Omega-3 Fatty Acids (FISH OIL) 1200 MG CAPS Take 1 capsule by mouth daily.    Pirfenidone 801 MG TABS Take 1 tablet by mouth 3 (three) times daily.     pravastatin (PRAVACHOL) 20 MG tablet Take 20 mg by mouth daily.      STOP taking these medications     omeprazole (PRILOSEC) 20 MG capsule         Outstanding Labs/Studies   None  Duration of Discharge Encounter   Greater than 30 minutes including physician time.  Lorelei Pont, PAC 11/06/2016, 11:14 AM   I have examined the patient and reviewed assessment and plan and discussed with patient.  Agree with above as stated.  DAPT with secondary prevention including lipid lowering therapy and BP control meds. WOuld continue clopidogrel long term and stop aspirin in one year. Labs stable.  Unable to fix RCA branches yesterday.  WIll see how his sx are  and consier repeat cath from the groin if he is still symptomatic.    Did well with rehab.  SHOB improved.  Plan D/C today.    Lance Muss

## 2016-11-05 NOTE — Interval H&P Note (Signed)
Cath Lab Visit (complete for each Cath Lab visit)  Clinical Evaluation Leading to the Procedure:   ACS: No.  Non-ACS:    Anginal Classification: CCS III  Anti-ischemic medical therapy: Minimal Therapy (1 class of medications)  Non-Invasive Test Results: No non-invasive testing performed  Prior CABG: No previous CABG  Patient was initially referred for CABG after diagnostic cath but he declined due to his progressive interstitial lung disease.  He is followed at [facility] and was told that this would progress in the next few years.  He was concerned about his chronic cough as well.  We discussed multivessel PCI as an alternative and he would like to pursue this.  Discussed the plan with his wife they both agree.  They understand there is an increased likelihood for repeat procedure and a change we would not be able to get complete revascularization.  He wishes to proceed.   He has been taking his Plavix without interruption.    History and Physical Interval Note:  11/05/2016 7:35 AM  Douglas Lopez  has presented today for surgery, with the diagnosis of cad  The various methods of treatment have been discussed with the patient and family. After consideration of risks, benefits and other options for treatment, the patient has consented to  Procedure(s): Coronary Stent Intervention (N/A) as a surgical intervention .  The patient's history has been reviewed, patient examined, no change in status, stable for surgery.  I have reviewed the patient's chart and labs.  Questions were answered to the patient's satisfaction.     Lance Muss

## 2016-11-05 NOTE — Progress Notes (Signed)
Right radial tr band removed. Band deflated per protocol. Clean dry drsg applied with guaze and tegaderm. Site level 0 upon arrival to unit and level 0 post band removal. CSM wnl. Post tr band instructions given to pt with her understanding  verbalized.

## 2016-11-05 NOTE — Care Management Note (Signed)
Case Management Note  Patient Details  Name: Douglas Lopez MRN: [id number] Date of Birth: 01-29-1946  Subjective/Objective:    s/p stent intervention , will be on plavix, NCM will cont to follow for dc needs.                Action/Plan:   Expected Discharge Date:                  Expected Discharge Plan:  Home/Self Care  In-House Referral:     Discharge planning Services  CM Consult  Post Acute Care Choice:    Choice offered to:     DME Arranged:    DME Agency:     HH Arranged:    HH Agency:     Status of Service:  In process, will continue to follow  If discussed at Long Length of Stay Meetings, dates discussed:    Additional Comments:  Leone Haven, RN 11/05/2016, 2:15 PM

## 2016-11-05 NOTE — H&P (View-Only) (Signed)
Cardiology Office Note   Date:  10/08/2016   ID:  Douglas Lopez, DOB [birth year], MRN [id number]  PCP:  Delbert Harness, MD    No chief complaint on file.    Wt Readings from Last 3 Encounters:  10/08/16 247 lb 3.2 oz (112.1 kg)  06/18/16 246 lb (111.6 kg)  06/11/16 246 lb (111.6 kg)       History of Present Illness: Douglas Lopez is a 71 y.o. male  Who had a chest CT which showed coronary calcifications back in 2017.  He had annual stress tests for many years in [place].  He saw a cardiologist in [facility] and they managed his sx.    He went to pulmonary rehab.  He had an irregular heart beat and was diagnosed with PVCs.  He is in rehab for IPF.  He does not use supplemental oxygen.    He has never smoked.  His father had CABG x 4, twice, MI at age 40.  Mother had an MI.  Younger brother has had an MI.    He has been exercising 3x/week and he walks the treadmill for 35-45 minutes.  This has decreased with a flu like illness.  His oxygen sats decreases to 90% while doing that exercise.    He still some pressure in his chest at random times.  It can happen while sitting and reading.  It can last 30 seconds.  It is not severe.  He has not noticed it while exercising.  No change with Imdur.  He had a moderate risk stress test.     Past Medical History:  Diagnosis Date  . Hypertension   . Idiopathic pulmonary fibrosis (HCC)   . Right bundle branch block     History reviewed. No pertinent surgical history.   Current Outpatient Prescriptions  Medication Sig Dispense Refill  . amLODipine (NORVASC) 5 MG tablet Take 5 mg by mouth daily.    Marland Kitchen aspirin 81 MG tablet Take 81 mg by mouth daily.    . Calcium Carb-Cholecalciferol (CALCIUM-VITAMIN D3) 600-400 MG-UNIT CAPS Take 1 tablet by mouth daily.    . cholecalciferol (VITAMIN D) 1000 units tablet Take 1,000 Units by mouth daily.    . Cholecalciferol (VITAMIN D3) 2000 units TABS Take 1 tablet by mouth daily.    Marland Kitchen FIBER  SELECT GUMMIES PO Take 5 mg by mouth daily.    Marland Kitchen levothyroxine (SYNTHROID, LEVOTHROID) 125 MCG tablet Take 125 mcg by mouth daily before breakfast.    . Multiple Vitamins-Minerals (MULTIVITAMIN WITH MINERALS) tablet Take 1 tablet by mouth daily.    . Omega-3 Fatty Acids (FISH OIL) 1200 MG CAPS Take 1 capsule by mouth daily.    Marland Kitchen omeprazole (PRILOSEC) 20 MG capsule Take 20 mg by mouth daily.    . Pirfenidone 801 MG TABS Take 3 tablets by mouth daily.    . pravastatin (PRAVACHOL) 20 MG tablet Take 20 mg by mouth daily.    . isosorbide mononitrate (IMDUR) 30 MG 24 hr tablet Take 1 tablet (30 mg total) by mouth daily. 30 tablet 3   No current facility-administered medications for this visit.     Allergies:   Patient has no known allergies.    Social History:  The patient  reports that he has never smoked. He has never used smokeless tobacco. He reports that he does not drink alcohol or use drugs.   Family History:  The patient's family history includes Heart disease in his father and mother.  ROS:  Please see the history of present illness.   Otherwise, review of systems are positive for DOE, that he attributes to IPF.   All other systems are reviewed and negative.    PHYSICAL EXAM: VS:  BP 130/80   Pulse (!) 110   Ht  (1.803 m)   Wt 247 lb 3.2 oz (112.1 kg)   SpO2 95%   BMI 34.48 kg/m  , BMI Body mass index is 34.48 kg/m. GEN: Well nourished, well developed, in no acute distress , cough HEENT: normal  Neck: no JVD, carotid bruits, or masses Cardiac: RRR; no murmurs, rubs, or gallops, tr pretibial bilateral edema  Respiratory:  clear to auscultation bilaterally, normal work of breathing GI: soft, nontender, nondistended, + BS MS: no deformity or atrophy  Skin: warm and dry, no rash Neuro:  Strength and sensation are intact Psych: euthymic mood, full affect   EKG:   The ekg ordered today demonstrates NSR, RBBB, PVC   Recent Labs: No results found for requested labs  within last 8760 hours.   Lipid Panel No results found for: CHOL, TRIG, HDL, CHOLHDL, VLDL, LDLCALC, LDLDIRECT   Other studies Reviewed: Additional studies/ records that were reviewed today with results demonstrating: CT as noted above.  Stress test reviewed.     ASSESSMENT AND PLAN:  1. Coronary calcification: Given family h/o CAD and DOE, we performed nuclear stress test to eval for ischemia which came back as intermediate risk.  DOE may be related to lung disease rather than angina but wanted to make sure there is no high risk anatomy.  Imdur was started but DOE persists.  Increase Imdur to 60 mg daily.  He will have a 6 minute walk at [facility] in 11 days.  If he sees no difference, would consider cardiac cath to eval anatomy.  Given his family history, there is a strong possibility of CAD.   2. HTN: Continue amlodipine.  BP controlled.   3. Hyperlipidemia: Continue pravastatin. Would try to keep LDL below 100.    Current medicines are reviewed at length with the patient today.  The patient concerns regarding his medicines were addressed.  The following changes have been made:  No change  Labs/ tests ordered today include:   No orders of the defined types were placed in this encounter.   Recommend 150 minutes/week of aerobic exercise Low fat, low carb, high fiber diet recommended  Disposition:   FU in for stress test   Signed, Lance Muss, MD  10/08/2016 3:12 PM    [facility] [address] [place], [place], [place]  [address] Phone: [phone]; Fax: [phone]

## 2016-11-05 NOTE — Progress Notes (Signed)
Placed patient on CPAP for the night with pressure set at 16cm as per home settings

## 2016-11-05 NOTE — Progress Notes (Signed)
Pt discussed with susie admission nurse that he would like to discuss his code status.  Geoffry Paradise NP notified.  Will await return call.

## 2016-11-05 NOTE — Progress Notes (Signed)
Pt would like to discuss code status. Rhonda Barrett paged.  Will await return call. Info discussed with and passed on to  oncoming nurse.

## 2016-11-06 ENCOUNTER — Encounter: Payer: Self-pay | Admitting: Interventional Cardiology

## 2016-11-06 DIAGNOSIS — E785 Hyperlipidemia, unspecified: Secondary | ICD-10-CM | POA: Diagnosis not present

## 2016-11-06 DIAGNOSIS — I25119 Atherosclerotic heart disease of native coronary artery with unspecified angina pectoris: Secondary | ICD-10-CM | POA: Diagnosis not present

## 2016-11-06 DIAGNOSIS — R0609 Other forms of dyspnea: Secondary | ICD-10-CM | POA: Diagnosis not present

## 2016-11-06 DIAGNOSIS — Z8249 Family history of ischemic heart disease and other diseases of the circulatory system: Secondary | ICD-10-CM | POA: Diagnosis not present

## 2016-11-06 DIAGNOSIS — I209 Angina pectoris, unspecified: Secondary | ICD-10-CM | POA: Diagnosis not present

## 2016-11-06 DIAGNOSIS — I10 Essential (primary) hypertension: Secondary | ICD-10-CM | POA: Diagnosis not present

## 2016-11-06 DIAGNOSIS — E782 Mixed hyperlipidemia: Secondary | ICD-10-CM | POA: Diagnosis not present

## 2016-11-06 DIAGNOSIS — I2584 Coronary atherosclerosis due to calcified coronary lesion: Secondary | ICD-10-CM | POA: Diagnosis not present

## 2016-11-06 LAB — BASIC METABOLIC PANEL
Anion gap: 9 (ref 5–15)
BUN: 9 mg/dL (ref 6–20)
CO2: 27 mmol/L (ref 22–32)
Calcium: 9 mg/dL (ref 8.9–10.3)
Chloride: 99 mmol/L — ABNORMAL LOW (ref 101–111)
Creatinine, Ser: 0.9 mg/dL (ref 0.61–1.24)
GFR calc non Af Amer: >60 mL/min (ref >60)
Glucose, Bld: 105 mg/dL — ABNORMAL HIGH (ref 65–99)
POTASSIUM: 3.7 mmol/L (ref 3.5–5.1)
SODIUM: 135 mmol/L (ref 135–145)

## 2016-11-06 LAB — CBC
HEMATOCRIT: 41 % (ref 39.0–52.0)
Hemoglobin: 13.7 g/dL (ref 13.0–17.0)
MCH: 30.2 pg (ref 26.0–34.0)
MCHC: 33.4 g/dL (ref 30.0–36.0)
MCV: 90.3 fL (ref 78.0–100.0)
Platelets: 224 10*3/uL (ref 150–400)
RBC: 4.54 MIL/uL (ref 4.22–5.81)
RDW: 13.8 % (ref 11.5–15.5)
WBC: 8.8 10*3/uL (ref 4.0–10.5)

## 2016-11-06 MED ORDER — PANTOPRAZOLE SODIUM 40 MG PO TBEC: 40.0000 mg | DELAYED_RELEASE_TABLET | Freq: Every day | ORAL | 6 refills | Status: DC

## 2016-11-06 NOTE — Progress Notes (Signed)
CARDIAC REHAB PHASE I   PRE:  Rate/Rhythm81 SR  BP:  Sitting: 170/97        SaO2: 96 RA  MODE:  Ambulation: 500 ft   POST:  Rate/Rhythm: 107 ST  BP:  Sitting: 174/69         SaO2: 96 RA  Pt ambulated 500 ft on RA, independent, steady gait, tolerated well.  Pt c/o very mild DOE, states it is significantly improved, denies cp, dizziness, declined rest stop. Completed PCI/stent education with pt and wife at bedside.  Reviewed risk factors, anti-platelet therapy, stent card, activity restrictions, ntg, exercise, heart healthy diet, and phase 2 cardiac rehab. Pt and verbalized understanding, very receptive. Pt agrees to phase 2 cardiac rehab referral, will send to [facility] per pt request. Pt to recliner after walk, call bell within reach.   [phone] Joylene Grapes, RN, BSN 11/06/2016 10:03 AM

## 2016-11-06 NOTE — Progress Notes (Signed)
Progress Note  Patient Name: Douglas Lopez Date of Encounter: 11/06/2016  Primary Cardiologist: Eldridge Dace  Subjective   No chest pain  Inpatient Medications    Scheduled Meds: . active partnership for health of your heart book   Does not apply Once  . amLODipine  5 mg Oral Daily  . aspirin  81 mg Oral Daily  . clopidogrel  75 mg Oral Q breakfast  . isosorbide mononitrate  60 mg Oral Daily  . pantoprazole  40 mg Oral Q0600  . Pirfenidone  801 mg Oral TID  . pravastatin  20 mg Oral Daily  . sodium chloride flush  3 mL Intravenous Q12H   Continuous Infusions:  PRN Meds: sodium chloride, acetaminophen, fluticasone, nitroGLYCERIN, ondansetron (ZOFRAN) IV, sodium chloride flush   Vital Signs    Vitals:   11/05/16 2113 11/06/16 0359 11/06/16 0755 11/06/16 1000  BP:  (!) 153/77 (!) 170/93 119/65  Pulse: 77 71 85   Resp: 18 (!) 30 20   Temp:  98.1 F (36.7 C) 97.9 F (36.6 C)   TempSrc:  Axillary Oral   SpO2: 97% 98% 95%   Weight:  244 lb 0.8 oz (110.7 kg)    Height:        Intake/Output Summary (Last 24 hours) at 11/06/16 1031 Last data filed at 11/06/16 0755  Gross per 24 hour  Intake             1760 ml  Output             3075 ml  Net            -1315 ml   Filed Weights   11/05/16 0536 11/06/16 0359  Weight: 240 lb (108.9 kg) 244 lb 0.8 oz (110.7 kg)    Telemetry    NSR, PVCs- Personally Reviewed  ECG    NSR IRBBB, PVC - Personally Reviewed   GEN: No acute distress.   Neck: No JVD Cardiac: RRR, no murmurs, rubs, or gallops.  Respiratory: Clear to auscultation bilaterally. GI: Soft, nontender, non-distended  MS: No edema; No deformity. No right radial hematoma Neuro:  Nonfocal  Psych: Normal affect   Labs    Chemistry Recent Labs Lab 11/03/16 1129 11/06/16 0256  NA 139 135  K 4.1 3.7  CL 97 99*  CO2 23 27  GLUCOSE 96 105*  BUN 12 9  CREATININE 0.91 0.90  CALCIUM 9.3 9.0  GFRNONAA 84 >60  GFRAA 98 >60  ANIONGAP  --  9      Hematology Recent Labs Lab 11/03/16 1129 11/06/16 0256  WBC 7.0 8.8  RBC 4.38 4.54  HGB  --  13.7  HCT 39.0 41.0  MCV 89 90.3  MCH 30.8 30.2  MCHC 34.6 33.4  RDW 14.1 13.8  PLT 257 224    Cardiac EnzymesNo results for input(s): TROPONINI in the last 168 hours. No results for input(s): TROPIPOC in the last 168 hours.   BNPNo results for input(s): BNP, PROBNP in the last 168 hours.   DDimer No results for input(s): DDIMER in the last 168 hours.   Radiology    No results found.  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  He declined CABG due to his IPF and chronic cough, as well as on recs from his pulmonary MD regarding progression of his IPF.  Assessment & Plan    DAPT with secondary prevention  including lipid lowering therapy and BP control meds. Labs stable.  Unable to fix RCA branches yesterday.  WIll see how his sx are and consier repeat cath from the groin if he is still symptomatic.    Plan D/C today.    Signed, Lance Muss, MD  11/06/2016, 10:31 AM

## 2016-11-09 ENCOUNTER — Encounter (HOSPITAL_COMMUNITY): Payer: Self-pay | Admitting: Interventional Cardiology

## 2016-11-11 ENCOUNTER — Telehealth: Payer: Self-pay | Admitting: Interventional Cardiology

## 2016-11-11 NOTE — Telephone Encounter (Signed)
Left a message for Jasmine at [facility]. The completed referral form has been faxed again to [phone] with the diagnoses codes (Z95.5, Z98.61) on them. Confirmation was received that the fax went through.

## 2016-11-11 NOTE — Telephone Encounter (Signed)
New Message  Jasmine from [facility] call requesting to speak with RN about getting a referral Dx for pts referral for cardiac rehab. Please call back to discuss

## 2016-11-23 NOTE — Progress Notes (Signed)
Cardiology Office Note   Date:  11/24/2016   ID:  [name], DOB 01/03/1946, MRN [id number]  PCP:  Delbert Harness, MD    No chief complaint on file.  f/u CAD  Wt Readings from Last 3 Encounters:  11/24/16 246 lb (111.6 kg)  11/06/16 244 lb 0.8 oz (110.7 kg)  10/26/16 242 lb (109.8 kg)       History of Present Illness: Douglas Lopez is a 71 y.o. male  Who had a chest CT which showed coronary calcifications back in 2017.  He went to pulmonary rehab.  He had an irregular heart beat and was diagnosed with PVCs.  He is in rehab for IPF.  He does not use supplemental oxygen.    He has never smoked.  His father had CABG x 4, twice, MI at age 24.  Mother had an MI.  Younger brother has had an MI.    He had an abnormal stress test in 2017.  THis led to a cath which showed multivessel CAD.  He declined CABG due to his chronic lung disease and had PCI to the LAD, diagonal and circ.  RCA disease was not treated.   He goes to cardiac rehab in [place].  He has been exercising 3x/week and he walks the treadmill for 60 minutes.     No bleeding problems on DAPT.      Past Medical History:  Diagnosis Date  . Arthritis    "joints" (11/05/2016)  . Bleeding stomach ulcer 1984  . Chronic bronchitis (HCC)   . Coronary artery disease    11/05/16 Cath with PCI and DES x1, Dist Crx, mLAD, dLAD  . High cholesterol   . History of blood transfusion 1984   "took 5 units for bleeding stomach ulcer"  . Hypertension   . Hypothyroidism   . Idiopathic pulmonary fibrosis (HCC)    dx'd 10/2015  . OSA on CPAP   . Pneumonia    "couple times in the last 5 years" (11/05/2016)  . Right bundle branch block     Past Surgical History:  Procedure Laterality Date  . CATARACT EXTRACTION W/ INTRAOCULAR LENS  IMPLANT, BILATERAL Bilateral 1994  . CORONARY ANGIOPLASTY WITH STENT PLACEMENT  11/05/2016  . CORONARY STENT INTERVENTION N/A 11/05/2016   Procedure: Coronary Stent Intervention;  Surgeon:  Corky Crafts, MD;  Location: [facility] INVASIVE CV LAB;  Service: Cardiovascular;  Laterality: N/A;  . CYSTOSCOPY W/ STONE MANIPULATION  1994  . INGUINAL HERNIA REPAIR Left 1972  . KNEE ARTHROSCOPY Bilateral 1994  . RIGHT/LEFT HEART CATH AND CORONARY ANGIOGRAPHY N/A 10/26/2016   Procedure: Right/Left Heart Cath and Coronary Angiography;  Surgeon: Corky Crafts, MD;  Location: [facility] INVASIVE CV LAB;  Service: Cardiovascular;  Laterality: N/A;  . TONSILLECTOMY  1950s     Current Outpatient Prescriptions  Medication Sig Dispense Refill  . amLODipine (NORVASC) 5 MG tablet Take 5 mg by mouth daily.    Marland Kitchen aspirin 81 MG tablet Take 81 mg by mouth daily.    . Calcium Carb-Cholecalciferol (CALCIUM-VITAMIN D3) 600-400 MG-UNIT CAPS Take 1 tablet by mouth daily.    . cholecalciferol (VITAMIN D) 1000 units tablet Take 1,000 Units by mouth daily.    . clopidogrel (PLAVIX) 75 MG tablet Take 1 tablet (75 mg total) by mouth daily. 90 tablet 3  . FIBER SELECT GUMMIES PO Take 5 g by mouth daily.     . fluticasone (FLONASE) 50 MCG/ACT nasal spray Place 1 spray into both  nostrils daily as needed for allergies or rhinitis.    Marland Kitchen isosorbide mononitrate (IMDUR) 60 MG 24 hr tablet Take 1 tablet (60 mg total) by mouth daily. 90 tablet 3  . Levothyroxine Sodium 25 MCG CAPS Take 25 mcg by mouth daily before breakfast.     . Multiple Vitamins-Minerals (MULTIVITAMIN WITH MINERALS) tablet Take 1 tablet by mouth daily.    . nitroGLYCERIN (NITROSTAT) 0.4 MG SL tablet Place 1 tablet (0.4 mg total) under the tongue every 5 (five) minutes as needed for chest pain. 90 tablet 3  . Omega-3 Fatty Acids (FISH OIL) 1200 MG CAPS Take 1 capsule by mouth daily.    . pantoprazole (PROTONIX) 40 MG tablet Take 1 tablet (40 mg total) by mouth daily at 6 (six) AM. 30 tablet 6  . Pirfenidone 801 MG TABS Take 1 tablet by mouth 3 (three) times daily.     . pravastatin (PRAVACHOL) 20 MG tablet Take 20 mg by mouth daily.     No current  facility-administered medications for this visit.     Allergies:   Patient has no known allergies.    Social History:  The patient  reports that he has never smoked. He has never used smokeless tobacco. He reports that he does not drink alcohol or use drugs.   Family History:  The patient's family history includes Heart disease in his father and mother.    ROS:  Please see the history of present illness.   Otherwise, review of systems are positive for DOE, improving.   All other systems are reviewed and negative.    PHYSICAL EXAM: VS:  BP 124/88 (BP Location: Left Arm, Patient Position: Sitting, Cuff Size: Normal)   Pulse 78   Ht  (1.803 m)   Wt 246 lb (111.6 kg)   SpO2 94%   BMI 34.31 kg/m  , BMI Body mass index is 34.31 kg/m. GEN: Well nourished, well developed, in no acute distress , cough HEENT: normal  Neck: no JVD, carotid bruits, or masses Cardiac: RRR; no murmurs, rubs, or gallops, tr pretibial bilateral edema  Respiratory:  clear to auscultation bilaterally, normal work of breathing GI: soft, nontender, nondistended, + BS MS: no deformity or atrophy  Skin: warm and dry, no rash; 3+ right radial pulse Neuro:  Strength and sensation are intact Psych: euthymic mood, full affect   EKG:   The ekg ordered today demonstrates NSR, RBBB, PVC   Recent Labs: 11/06/2016: BUN 9; Creatinine, Ser 0.90; Hemoglobin 13.7; Platelets 224; Potassium 3.7; Sodium 135   Lipid Panel No results found for: CHOL, TRIG, HDL, CHOLHDL, VLDL, LDLCALC, LDLDIRECT   Other studies Reviewed: Additional studies/ records that were reviewed today with results demonstrating: CT as noted above.  Stress test reviewed.     ASSESSMENT AND PLAN:  1. Coronary calcification/CAD: s/p multivessel PCI.  Angina controlled on medical therapy.  Decrease Imdur to 30 mg daily.  If sx worsen, can increase back to 60 mg daily.  If angina worsens, could readdress CAD that is in the distal RCA system 2. HTN:  Continue amlodipine.  BP controlled.  Follow at home.  3. Hyperlipidemia: Continue pravastatin. Would try to keep LDL below 100.  He is going to schedule annual physical and get blood checked. Increase pravastatin if needed. 4. SHOB: improving.   Current medicines are reviewed at length with the patient today.  The patient concerns regarding his medicines were addressed.  The following changes have been made:  No change  Labs/ tests ordered today include:   No orders of the defined types were placed in this encounter.   Recommend 150 minutes/week of aerobic exercise Low fat, low carb, high fiber diet recommended  Disposition:   FU in 3-4 months   Signed, Lance Muss, MD  11/24/2016 9:59 AM    [facility] [address] [place], [place], [place]  [address] Phone: [phone]; Fax: [phone]

## 2016-11-24 ENCOUNTER — Encounter: Payer: Self-pay | Admitting: Interventional Cardiology

## 2016-11-24 ENCOUNTER — Ambulatory Visit (INDEPENDENT_AMBULATORY_CARE_PROVIDER_SITE_OTHER): Payer: Medicare Other | Admitting: Interventional Cardiology

## 2016-11-24 VITALS — BP 124/88 | HR 78 | Ht 71.0 in | Wt 246.0 lb

## 2016-11-24 DIAGNOSIS — I10 Essential (primary) hypertension: Secondary | ICD-10-CM

## 2016-11-24 DIAGNOSIS — I25119 Atherosclerotic heart disease of native coronary artery with unspecified angina pectoris: Secondary | ICD-10-CM

## 2016-11-24 DIAGNOSIS — J84112 Idiopathic pulmonary fibrosis: Secondary | ICD-10-CM

## 2016-11-24 DIAGNOSIS — I209 Angina pectoris, unspecified: Secondary | ICD-10-CM

## 2016-11-24 DIAGNOSIS — I2510 Atherosclerotic heart disease of native coronary artery without angina pectoris: Secondary | ICD-10-CM

## 2016-11-24 DIAGNOSIS — R0602 Shortness of breath: Secondary | ICD-10-CM

## 2016-11-24 NOTE — Patient Instructions (Signed)
Medication Instructions:  Your physician recommends that you continue on your current medications as directed. Please refer to the Current Medication list given to you today.   Labwork: None  Testing/Procedures: None ordered.  Follow-Up: Your physician recommends that you schedule a follow-up appointment in: 3 months with Dr. Eldridge Dace.    Any Other Special Instructions Will Be Listed Below (If Applicable).     If you need a refill on your cardiac medications before your next appointment, please call your pharmacy.

## 2016-12-07 ENCOUNTER — Telehealth: Payer: Self-pay | Admitting: Interventional Cardiology

## 2016-12-07 NOTE — Telephone Encounter (Signed)
New Message    Pt c/o Syncope: STAT if syncope occurred within 30 minutes and pt complains of lightheadedness [place] if episode of passing out, completely, today or in last 24 hours   1. Did you pass out today? No   2. When is the last time you passed out? Friday 12/04/16  3. Has this occurred multiple times?  First time it has happened  4. Did you have any symptoms prior to passing out? None

## 2016-12-07 NOTE — Telephone Encounter (Signed)
Called [facility] who [facility] [place] [facility], went on a roller coaster then a train and slumped over on his grandson. He states he was out for 45-60 seconds according to his wife. He denies any other symptoms of dizziness, shortness of breath, palpitations, or chest pain. This is the first time this has occurred. He said Saturday 12/05/2016 he has a bit of dizziness. Pt has appt for syncope on 12/09/2016 with Dr. Eldridge DaceVaranasi as well as a follow up appt 03/18/17. Please review and advise. Advised patient to keep appointment.

## 2016-12-08 ENCOUNTER — Encounter: Payer: Self-pay | Admitting: Interventional Cardiology

## 2016-12-09 ENCOUNTER — Ambulatory Visit (INDEPENDENT_AMBULATORY_CARE_PROVIDER_SITE_OTHER): Payer: Medicare Other | Admitting: Interventional Cardiology

## 2016-12-09 ENCOUNTER — Encounter: Payer: Self-pay | Admitting: Interventional Cardiology

## 2016-12-09 ENCOUNTER — Telehealth: Payer: Self-pay | Admitting: Interventional Cardiology

## 2016-12-09 VITALS — BP 130/60 | HR 74 | Ht 71.0 in | Wt 238.8 lb

## 2016-12-09 DIAGNOSIS — J84112 Idiopathic pulmonary fibrosis: Secondary | ICD-10-CM | POA: Diagnosis not present

## 2016-12-09 DIAGNOSIS — R55 Syncope and collapse: Secondary | ICD-10-CM | POA: Diagnosis not present

## 2016-12-09 DIAGNOSIS — I209 Angina pectoris, unspecified: Secondary | ICD-10-CM

## 2016-12-09 DIAGNOSIS — I25118 Atherosclerotic heart disease of native coronary artery with other forms of angina pectoris: Secondary | ICD-10-CM | POA: Diagnosis not present

## 2016-12-09 DIAGNOSIS — E782 Mixed hyperlipidemia: Secondary | ICD-10-CM

## 2016-12-09 DIAGNOSIS — I2510 Atherosclerotic heart disease of native coronary artery without angina pectoris: Secondary | ICD-10-CM | POA: Diagnosis not present

## 2016-12-09 NOTE — Telephone Encounter (Signed)
New Message:    Pt had a new onset of possible SVT's,pt was just evaluated

## 2016-12-09 NOTE — Telephone Encounter (Signed)
Received the fax of strips of SVT run from [facility]. Dr. Eldridge DaceVaranasi reviewed the strips and advised patient could still exercise in the cardiac rehab class tomorrow as long as he was asymptomatic. Pt did not complain of any symptoms during his OV today. Returned call to Stanton KidneyDebra, Charity fundraiserN and relayed the message to her per Dr. Eldridge DaceVaranasi and she verbalized understanding and  thanked me for my call.

## 2016-12-09 NOTE — Patient Instructions (Addendum)
Medication Instructions:  Your physician recommends that you continue on your current medications as directed. Please refer to the Current Medication list given to you today.   Labwork: None ordered.  Testing/Procedures: None ordered.  Follow-Up: Keep appointment scheduled for 03/18/2017 at 11:20 AM with Dr. Eldridge DaceVaranasi.  Any Other Special Instructions Will Be Listed Below (If Applicable).     If you need a refill on your cardiac medications before your next appointment, please call your pharmacy.

## 2016-12-09 NOTE — Telephone Encounter (Signed)
Stanton Kidneyebra, RN calling and wanted to make Dr. Eldridge DaceVaranasi aware that patient had a run of SVT this morning at cardiac rehab. She states that she will fax over the strips for Dr. Eldridge DaceVaranasi to review and give recommendation on whether the patient can proceed with exercise. Next class scheduled for tomorrow. Waiting for fax to come through.

## 2016-12-09 NOTE — Progress Notes (Signed)
Cardiology Office Note   Date:  12/09/2016   ID:  Douglas Lopez, DOB 23-Aug-1945, MRN [id number]  PCP:  Douglas Mis, MD    No chief complaint on file.  f/u CAD  Wt Readings from Last 3 Encounters:  12/09/16 238 lb 12.8 oz (108.3 kg)  11/24/16 246 lb (111.6 kg)  11/06/16 244 lb 0.8 oz (110.7 kg)       History of Present Illness: Douglas Lopez is a 71 y.o. male  Who had a chest CT which showed coronary calcifications back in 2017.  He went to pulmonary rehab.  He had an irregular heart beat and was diagnosed with PVCs.  He is in rehab for IPF.  He does not use supplemental oxygen.    He has never smoked.  His father had CABG x 4, twice, MI at age 56.  Mother had an MI.  Younger brother has had an MI.    He had an abnormal stress test in 2017.  THis led to a cath which showed multivessel CAD.  He declined CABG due to his chronic lung disease and had PCI to the LAD, diagonal and circ.  RCA disease was not treated.  No bleeding problems on DAPT.  A few weeks ago, he was at [facility].  He rode a roller coaster and fel like it was stressful.  He walked around for a while.  While he was on the train, he had some abdominal cramping.  He then passed out for 30 seconds or so.  He woke up after slumping over.    He was checked out by EMS, VSS, O2 sats were stable.  He went to the bathroom and then felt fine.  He continued to the park.  He had some lightheadedness the next day at the park.  He went back to cardiac rehab.  He had no problems with rehab over the past few days.  No lightheadedness or syncope since that time.  He tried Imdur 30 mg daily, but felt better on the 60 mg.      Past Medical History:  Diagnosis Date  . Arthritis    "joints" (11/05/2016)  . Bleeding stomach ulcer 1984  . Chronic bronchitis (HCC)   . Coronary artery disease    11/05/16 Cath with PCI and DES x1, Dist Crx, mLAD, dLAD  . High cholesterol   . History of blood transfusion 1984   "took 5 units  for bleeding stomach ulcer"  . Hypertension   . Hypothyroidism   . Idiopathic pulmonary fibrosis (HCC)    dx'd 10/2015  . OSA on CPAP   . Pneumonia    "couple times in the last 5 years" (11/05/2016)  . Right bundle branch block     Past Surgical History:  Procedure Laterality Date  . CATARACT EXTRACTION W/ INTRAOCULAR LENS  IMPLANT, BILATERAL Bilateral 1994  . CORONARY ANGIOPLASTY WITH STENT PLACEMENT  11/05/2016  . CORONARY STENT INTERVENTION N/A 11/05/2016   Procedure: Coronary Stent Intervention;  Surgeon: Corky Crafts, MD;  [facility] INVAS[facility] CV LAB;  Service: Cardiovascular;  Laterality: N/A;  . CYSTOSCOPY W/ STONE MANIPULATION  1994  . INGUINAL HERNIA REPAIR Left 1972  . KNEE ARTHROSCOPY Bilateral 1994  . RIGHT/LEFT HEART CATH AND CORONARY ANGIOGRAPHY N/A 10/26/2016   Procedure: Right/Left Heart Cath and Coronary Angiography;  Surgeon: Corky Crafts, MD;  Location: [facility] INVASIVE CV LAB;  Service: Cardiovascular;  Laterality: N/A;  . TONSILLECTOMY  1950s     Current Outpatient  Prescriptions  Medication Sig Dispense Refill  . amLODipine (NORVASC) 5 MG tablet Take 5 mg by mouth daily.    Marland Kitchen. aspirin 81 MG tablet Take 81 mg by mouth daily.    . Calcium Carb-Cholecalciferol (CALCIUM-VITAMIN D3) 600-400 MG-UNIT CAPS Take 1 tablet by mouth daily.    . cholecalciferol (VITAMIN D) 1000 units tablet Take 1,000 Units by mouth daily.    . clopidogrel (PLAVIX) 75 MG tablet Take 1 tablet (75 mg total) by mouth daily. 90 tablet 3  . FIBER SELECT GUMMIES PO Take 5 g by mouth daily.     . fluticasone (FLONASE) 50 MCG/ACT nasal spray Place 1 spray into both nostrils daily as needed for allergies or rhinitis.    Marland Kitchen. isosorbide mononitrate (IMDUR) 60 MG 24 hr tablet Take 1 tablet (60 mg total) by mouth daily. 90 tablet 3  . Levothyroxine Sodium 25 MCG CAPS Take 25 mcg by mouth daily before breakfast.     . Multiple Vitamins-Minerals (MULTIVITAMIN WITH MINERALS) tablet Take 1 tablet by  mouth daily.    . nitroGLYCERIN (NITROSTAT) 0.4 MG SL tablet Place 1 tablet (0.4 mg total) under the tongue every 5 (five) minutes as needed for chest pain. 90 tablet 3  . Omega-3 Fatty Acids (FISH OIL) 1200 MG CAPS Take 1 capsule by mouth daily.    . pantoprazole (PROTONIX) 40 MG tablet Take 1 tablet (40 mg total) by mouth daily at 6 (six) AM. 30 tablet 6  . Pirfenidone 801 MG TABS Take 1 tablet by mouth 3 (three) times daily.     . pravastatin (PRAVACHOL) 20 MG tablet Take 20 mg by mouth daily.     No current facility-administered medications for this visit.     Allergies:   Patient has no known allergies.    Social History:  The patient  reports that he has never smoked. He has never used smokeless tobacco. He reports that he does not drink alcohol or use drugs.   Family History:  The patient's family history includes Heart disease in his father and mother.    ROS:  Please see the history of present illness.   Otherwise, review of systems are positive for DOE, improving.   All other systems are reviewed and negative.    PHYSICAL EXAM: VS:  BP 130/60 (BP Location: Right Arm, Patient Position: Sitting, Cuff Size: Large)   Pulse 74   Ht 5\' 11"  (1.803 m)   Wt 238 lb 12.8 oz (108.3 kg)   SpO2 91%   BMI 33.31 kg/m  , BMI Body mass index is 33.31 kg/m. GEN: Well nourished, well developed, in no acute distress , cough HEENT: normal  Neck: no JVD, carotid bruits, or masses Cardiac: RRR; no murmurs, rubs, or gallops, tr pretibial bilateral edema  Respiratory:  clear to auscultation bilaterally, normal work of breathing GI: soft, nontender, nondistended, + BS MS: no deformity or atrophy  Skin: warm and dry, no rash; 3+ right radial pulse Neuro:  Strength and sensation are intact Psych: euthymic mood, full affect   EKG:   The ekg ordered today demonstrates NSR, RBBB, PVC   Recent Labs: 11/06/2016: BUN 9; Creatinine, Ser 0.90; Hemoglobin 13.7; Platelets 224; Potassium 3.7; Sodium  135   Lipid Panel No results found for: CHOL, TRIG, HDL, CHOLHDL, VLDL, LDLCALC, LDLDIRECT   Other studies Reviewed: Additional studies/ records that were reviewed today with results demonstrating: CT as noted above.  Stress test reviewed.     ASSESSMENT AND PLAN:  1.  Coronary calcification/CAD/syncope: s/p multivessel PCI.  Angina controlled on medical therapy.  Continue Imdur 60 mg daily.  If angina worsens, could readdress CAD that is in the distal RCA system.  I don't think his syncope was related to his CAD. 2. HTN: Continue amlodipine.  BP controlled.  Follow at home.  3. Hyperlipidemia: Continue pravastatin. Would try to keep LDL below 100.  LDL 105 at last check in 5/18.  COntinue pravastatin.  COuld increase dose, but will not do so at this time given syncope.  Will reconsider in August. 4. SHOB: stable.  Multifactorial .  He knows his limitiations with the IPF.   Current medicines are reviewed at length with the patient today.  The patient concerns regarding his medicines were addressed.  The following changes have been made:  No change  Labs/ tests ordered today include:   No orders of the defined types were placed in this encounter.   Recommend 150 minutes/week of aerobic exercise Low fat, low carb, high fiber diet recommended  Disposition:   FU in 3-4 months   Signed, Lance Muss, MD  12/09/2016 2:36 PM    [facility] [address] [place], [place], [place]  [address] Phone: [phone]; Fax: [phone]

## 2016-12-31 ENCOUNTER — Encounter: Payer: Self-pay | Admitting: Interventional Cardiology

## 2016-12-31 ENCOUNTER — Telehealth: Payer: Self-pay

## 2016-12-31 NOTE — Telephone Encounter (Signed)
Fax received from [facility] and strips were similar to the strips received on 12/09/16. Reviewed with DOD. Since the patient was not having any symptoms, patient advised that he can continue exercise and to let us know if he develops symptoms.

## 2016-12-31 NOTE — Telephone Encounter (Signed)
Called and spoke to Gavin Poundeborah at [facility] regarding [employer] email. Patient stated that Gavin PoundDeborah told him that she saw some irregularities with his heart rhythm and that she was going to fax over the strips. Fax has not come through. Gavin PoundDeborah states that the patient's HR got up to the 140s and was similar to the strips sent on 12/09/16. She states that the patient was completely asymptomatic during the episode. Gavin PoundDeborah states that she will send the fax of the strips again since we have not received them.

## 2017-01-14 ENCOUNTER — Telehealth: Payer: Self-pay

## 2017-01-14 MED ORDER — METOPROLOL TARTRATE 25 MG PO TABS: 25.0000 mg | ORAL_TABLET | Freq: Two times a day (BID) | ORAL | 3 refills | Status: DC

## 2017-01-14 NOTE — Addendum Note (Signed)
Addended by: Daleen BoURRIE, Susie Ehresman I on: 01/14/2017 03:16 PM   Modules accepted: Orders

## 2017-01-14 NOTE — Telephone Encounter (Signed)
OK to start metoprolol 25 mg BID to see if this suppresses SVT.

## 2017-01-14 NOTE — Telephone Encounter (Signed)
Fax received from [facility] again with patient in SVT while exercising. Called patient and he states that he was having discomfort in his left shoulder that he thought was from sleeping on it wrong. Denies any chest pain or any other symptoms, but was unable to finish exercise. Please advise if you would like to start a beta blocker.

## 2017-01-14 NOTE — Telephone Encounter (Signed)
Patient made aware of Dr. Hoyle BarrVaranasi's recommendation to start metoprolol 25 mg BID to see if this helps suppress his SVT. Patient verbalized understanding. Rx sent to patient's preferred pharmacy. Patient thanked me for the call.

## 2017-02-10 ENCOUNTER — Encounter: Payer: Self-pay | Admitting: Interventional Cardiology

## 2017-02-17 ENCOUNTER — Other Ambulatory Visit: Payer: Self-pay | Admitting: Interventional Cardiology

## 2017-02-17 MED ORDER — CLOPIDOGREL BISULFATE 75 MG PO TABS: 75.0000 mg | ORAL_TABLET | Freq: Every day | ORAL | 2 refills | Status: DC

## 2017-02-17 MED ORDER — PANTOPRAZOLE SODIUM 40 MG PO TBEC: 40.0000 mg | DELAYED_RELEASE_TABLET | Freq: Every day | ORAL | 2 refills | Status: DC

## 2017-02-17 MED ORDER — METOPROLOL TARTRATE 25 MG PO TABS: 25.0000 mg | ORAL_TABLET | Freq: Two times a day (BID) | ORAL | 2 refills | Status: DC

## 2017-02-17 NOTE — Telephone Encounter (Signed)
[facility] pharmacy requesting pt's refills be sent to their pharmacy. [facility] was [facility]. Confirmation received.

## 2017-03-18 ENCOUNTER — Encounter: Payer: Self-pay | Admitting: Interventional Cardiology

## 2017-03-18 ENCOUNTER — Ambulatory Visit (INDEPENDENT_AMBULATORY_CARE_PROVIDER_SITE_OTHER): Payer: Medicare Other | Admitting: Interventional Cardiology

## 2017-03-18 VITALS — BP 110/60 | HR 65 | Ht 71.0 in | Wt 245.8 lb

## 2017-03-18 DIAGNOSIS — I25118 Atherosclerotic heart disease of native coronary artery with other forms of angina pectoris: Secondary | ICD-10-CM

## 2017-03-18 DIAGNOSIS — E782 Mixed hyperlipidemia: Secondary | ICD-10-CM | POA: Diagnosis not present

## 2017-03-18 DIAGNOSIS — R0602 Shortness of breath: Secondary | ICD-10-CM | POA: Diagnosis not present

## 2017-03-18 DIAGNOSIS — I2510 Atherosclerotic heart disease of native coronary artery without angina pectoris: Secondary | ICD-10-CM

## 2017-03-18 DIAGNOSIS — I209 Angina pectoris, unspecified: Secondary | ICD-10-CM

## 2017-03-18 DIAGNOSIS — E785 Hyperlipidemia, unspecified: Secondary | ICD-10-CM | POA: Insufficient documentation

## 2017-03-18 NOTE — Progress Notes (Signed)
Cardiology Office Note   Date:  03/18/2017   ID:  Douglas Miyamotoennis Lopez, DOB 03/15/1946, MRN [id number]  PCP:  [name], Kim K, MD    No chief complaint on file.  CAD  Wt Readings from Last 3 Encounters:  03/18/17 245 lb 12.8 oz (111.5 kg)  12/09/16 238 lb 12.8 oz (108.3 kg)  11/24/16 246 lb (111.6 kg)       History of Present Illness: Douglas MiyamotoDennis Lopez is a 71 y.o. male  Who had a chest CT which showed coronary calcifications back in 2017.  He went to pulmonary rehab.  He had an irregular heart beat and was diagnosed with PVCs.  He is in rehab for IPF.  He does not use supplemental oxygen, but was told his life expectancy would be reduced due to the IPF.    He has never smoked.  His father had CABG x 4, twice, MI at age 71.  Mother had an MI.  Younger brother has had an MI.    He had an abnormal stress test in 2017.  THis led to a cath which showed multivessel CAD.  He declined CABG due to his chronic lung disease and had PCI to the LAD, diagonal and circ.  RCA disease was not treated.  No bleeding problems on DAPT.  He has had some chest pain that is similar to his prior angina.  It is not as severe. He has not used any nitroglycerin. He has not thought to use any nitroglycerin given that the symptoms are not nearly what they were prior to his stent placement back in April.    Past Medical History:  Diagnosis Date  . Arthritis    "joints" (11/05/2016)  . Bleeding stomach ulcer 1984  . Chronic bronchitis (HCC)   . Coronary artery disease    11/05/16 Cath with PCI and DES x1, Dist Crx, mLAD, dLAD  . High cholesterol   . History of blood transfusion 1984   "took 5 units for bleeding stomach ulcer"  . Hypertension   . Hypothyroidism   . Idiopathic pulmonary fibrosis (HCC)    dx'd 10/2015  . OSA on CPAP   . Pneumonia    "couple times in the last 5 years" (11/05/2016)  . Right bundle branch block     Past Surgical History:  Procedure Laterality Date  . CATARACT EXTRACTION W/  INTRAOCULAR LENS  IMPLANT, BILATERAL Bilateral 1994  . CORONARY ANGIOPLASTY WITH STENT PLACEMENT  11/05/2016  . CORONARY STENT INTERVENTION N/A 11/05/2016   Procedure: Coronary Stent Intervention;  Surgeon: Corky CraftsJayadeep S Cormick Moss, MD;  [facility];  Service: Cardiovascular;  Laterality: N/A;  . CYSTOSCOPY W/ STONE MANIPULATION  1994  . INGUINAL HERNIA REPAIR Left 1972  . KNEE ARTHROSCOPY Bilateral 1994  . RIGHT/LEFT HEART CATH AND CORONARY ANGIOGRAPHY N/A 10/26/2016   Procedure: Right/Left Heart Cath and Coronary Angiography;  Surgeon: Corky CraftsJayadeep S Tava Peery, MD;  [facility];  Service: Cardiovascular;  Laterality: N/A;  . TONSILLECTOMY  1950s     Current Outpatient Prescriptions  Medication Sig Dispense Refill  . amLODipine (NORVASC) 5 MG tablet Take 5 mg by mouth daily.    Marland Kitchen. aspirin 81 MG tablet Take 81 mg by mouth daily.    . Calcium Carb-Cholecalciferol (CALCIUM-VITAMIN D3) 600-400 MG-UNIT CAPS Take 1 tablet by mouth daily.    . cholecalciferol (VITAMIN D) 1000 units tablet Take 1,000 Units by mouth daily.    . clopidogrel (PLAVIX) 75 MG tablet Take 1  tablet (75 mg total) by mouth daily. 90 tablet 2  . FIBER SELECT GUMMIES PO Take 5 g by mouth daily.     . fluticasone (FLONASE) 50 MCG/ACT nasal spray Place 1 spray into both nostrils daily as needed for allergies or rhinitis.    . Levothyroxine Sodium 25 MCG CAPS Take 25 mcg by mouth daily before breakfast.     . metoprolol tartrate (LOPRESSOR) 25 MG tablet Take 1 tablet (25 mg total) by mouth 2 (two) times daily. 180 tablet 2  . Multiple Vitamins-Minerals (MULTIVITAMIN WITH MINERALS) tablet Take 1 tablet by mouth daily.    . Omega-3 Fatty Acids (FISH OIL) 1200 MG CAPS Take 1 capsule by mouth daily.    . pantoprazole (PROTONIX) 40 MG tablet Take 1 tablet (40 mg total) by mouth daily at 6 (six) AM. 90 tablet 2  . Pirfenidone 801 MG TABS Take 1 tablet by mouth 3 (three) times daily.     . pravastatin (PRAVACHOL)  20 MG tablet Take 20 mg by mouth daily.    . isosorbide mononitrate (IMDUR) 60 MG 24 hr tablet Take 1 tablet (60 mg total) by mouth daily. 90 tablet 3  . nitroGLYCERIN (NITROSTAT) 0.4 MG SL tablet Place 1 tablet (0.4 mg total) under the tongue every 5 (five) minutes as needed for chest pain. 90 tablet 3  . omeprazole (PRILOSEC) 20 MG capsule Take 1 capsule by mouth daily.     No current facility-administered medications for this visit.     Allergies:   Patient has no known allergies.    Social History:  The patient  reports that he has never smoked. He has never used smokeless tobacco. He reports that he does not drink alcohol or use drugs.   Family History:  The patient's family history includes Heart disease in his father and mother.    ROS:  Please see the history of present illness.   Otherwise, review of systems are positive for Chronic cough, shortness of breath.   All other systems are reviewed and negative.    PHYSICAL EXAM: VS:  BP 110/60   Pulse 65   Ht 5\' 11"  (1.803 m)   Wt 245 lb 12.8 oz (111.5 kg)   SpO2 95%   BMI 34.28 kg/m  , BMI Body mass index is 34.28 kg/m. GEN: Well nourished, well developed, in no acute distress  HEENT: normal  Neck: no JVD, carotid bruits, or masses Cardiac: RRR; no murmurs, rubs, or gallops, trace pretibial edema  Respiratory:  clear to auscultation bilaterally, normal work of breathing GI: soft, nontender, nondistended, + BS MS: no deformity or atrophy  Skin: warm and dry, no rash Neuro:  Strength and sensation are intact Psych: euthymic mood, full affect   EKG:   The ekg ordered today demonstrates - not done today   Recent Labs: 11/06/2016: BUN 9; Creatinine, Ser 0.90; Hemoglobin 13.7; Platelets 224; Potassium 3.7; Sodium 135   Lipid Panel No results found for: CHOL, TRIG, HDL, CHOLHDL, VLDL, LDLCALC, LDLDIRECT   Other studies Reviewed: Additional studies/ records that were reviewed today with results demonstrating: cath  results reviewed.   ASSESSMENT AND PLAN:  1. Coronary artery disease: Status post multivessel PCI in early 2018. Angina controlled on current medical therapy. There is residual disease in his distal RCA system. Difficulty engaging the RCA from the radial approach with any type of guide support. Would have to consider femoral approach.  Continue medical therapy for angina.We did discuss repeat angiography at this point  based on his symptoms. He is not feel that symptoms are bad enough to consider this. He will try some nitroglycerin at home. If he has worsening symptoms in terms of severity or frequency, he would let us know. However low threshold for repeat heart cath. 2. Hypertension: Continue amlodipine.  We controlled at rehab. 3. Hyperlipidemia: LDL was 105 in May 2018. COntinue pravastatin.  Healthy diet recommended as well.  4. Shortness of breath: Multifactorial. Likely related to IPF. 5. Syncope: He had a syncopal episode in May 2018. No further episodes.   Current medicines are reviewed at length with the patient today.  The patient concerns regarding his medicines were addressed.  The following changes have been made:  No change  Labs/ tests ordered today include:  No orders of the defined types were placed in this encounter.   Recommend 150 minutes/week of aerobic exercise Low fat, low carb, high fiber diet recommended  Disposition:   FU in 4 months   Signed, Lance Muss, MD  03/18/2017 11:50 AM    [facility] [address] [place], [place], [place]  [address] Phone: [phone]; Fax: [phone]

## 2017-03-18 NOTE — Patient Instructions (Addendum)
Medication Instructions:  Your physician recommends that you continue on your current medications as directed. Please refer to the Current Medication list given to you today.   Labwork: None ordered  Testing/Procedures: None ordered  Follow-Up: Your physician recommends that you schedule a follow-up appointment in: 4 months with Dr. Varanasi.    Any Other Special Instructions Will Be Listed Below (If Applicable).     If you need a refill on your cardiac medications before your next appointment, please call your pharmacy.   

## 2017-05-28 ENCOUNTER — Other Ambulatory Visit: Payer: Self-pay | Admitting: Interventional Cardiology

## 2017-07-19 ENCOUNTER — Encounter: Payer: Self-pay | Admitting: Interventional Cardiology

## 2017-07-19 ENCOUNTER — Ambulatory Visit (INDEPENDENT_AMBULATORY_CARE_PROVIDER_SITE_OTHER): Payer: Medicare Other | Admitting: Interventional Cardiology

## 2017-07-19 ENCOUNTER — Other Ambulatory Visit: Payer: Self-pay | Admitting: Interventional Cardiology

## 2017-07-19 VITALS — BP 126/74 | HR 70 | Ht 71.0 in | Wt 261.2 lb

## 2017-07-19 DIAGNOSIS — I209 Angina pectoris, unspecified: Secondary | ICD-10-CM

## 2017-07-19 DIAGNOSIS — R0602 Shortness of breath: Secondary | ICD-10-CM | POA: Diagnosis not present

## 2017-07-19 DIAGNOSIS — I25118 Atherosclerotic heart disease of native coronary artery with other forms of angina pectoris: Secondary | ICD-10-CM

## 2017-07-19 DIAGNOSIS — I10 Essential (primary) hypertension: Secondary | ICD-10-CM

## 2017-07-19 DIAGNOSIS — E782 Mixed hyperlipidemia: Secondary | ICD-10-CM | POA: Diagnosis not present

## 2017-07-19 DIAGNOSIS — I2510 Atherosclerotic heart disease of native coronary artery without angina pectoris: Secondary | ICD-10-CM | POA: Diagnosis not present

## 2017-07-19 DIAGNOSIS — J84112 Idiopathic pulmonary fibrosis: Secondary | ICD-10-CM

## 2017-07-19 NOTE — Patient Instructions (Signed)
Medication Instructions:  Your physician recommends that you continue on your current medications as directed. Please refer to the Current Medication list given to you today.   Labwork: Your physician recommends that you return for FASTING lab work: CMET, LIPIDS   Testing/Procedures: None ordered  Follow-Up: Your physician wants you to follow-up in: 6 months with Dr. Varanasi. You will receive a reminder letter in the mail two months in advance. If you don't receive a letter, please call our office to schedule the follow-up appointment.   Any Other Special Instructions Will Be Listed Below (If Applicable).     If you need a refill on your cardiac medications before your next appointment, please call your pharmacy.   

## 2017-07-19 NOTE — Progress Notes (Signed)
Cardiology Office Note   Date:  07/19/2017   ID:  Douglas Miyamotoennis Naron, DOB 02-12-1946, MRN [id number]  PCP:  [name], Kim K, MD    No chief complaint on file. CAD   Wt Readings from Last 3 Encounters:  07/19/17 261 lb 4 oz (118.5 kg)  03/18/17 245 lb 12.8 oz (111.5 kg)  12/09/16 238 lb 12.8 oz (108.3 kg)       History of Present Illness: Douglas Lopez is a 71 y.o. male  Who had a chest CT which showed coronary calcifications back in 2017.  He went to pulmonary rehab. He had an irregular heart beat and was diagnosed with PVCs. He is in rehab for IPF. He does not use supplemental oxygen, but was told his life expectancy would be reduced due to the IPF.   He has never smoked. His father had CABG x 4, twice, MI at age 71. Mother had an MI. Younger brother has had an MI.   He had an abnormal stress test in 2017. THis led to a cath which showed multivessel CAD. He declined CABG due to his chronic lung disease and had PCI to the LAD, diagonal and circ. RCA disease was not treated.  He had some angina that was treated medically several months ago.  It was not nearly as severe as what he had before his stents.   Used NTG once at a funeral since the last visit.  He feels more tired.  He has not had sx like he had before the stents.  His legs give out as well.  Pain in the thighs.  [facility]  Wife reports that he has some mild memory issues.     Past Medical History:  Diagnosis Date  . Arthritis    "joints" (11/05/2016)  . Bleeding stomach ulcer 1984  . Chronic bronchitis (HCC)   . Coronary artery disease    11/05/16 Cath with PCI and DES x1, Dist Crx, mLAD, dLAD  . High cholesterol   . History of blood transfusion 1984   "took 5 units for bleeding stomach ulcer"  . Hypertension   . Hypothyroidism   . Idiopathic pulmonary fibrosis (HCC)    dx'd 10/2015  . OSA on CPAP   . Pneumonia    "couple times in the last 5 years" (11/05/2016)  . Right  bundle branch block     Past Surgical History:  Procedure Laterality Date  . CATARACT EXTRACTION W/ INTRAOCULAR LENS  IMPLANT, BILATERAL Bilateral 1994  . CORONARY ANGIOPLASTY WITH STENT PLACEMENT  11/05/2016  . CORONARY STENT INTERVENTION N/A 11/05/2016   Procedure: Coronary Stent Intervention;  Surgeon: Corky CraftsJayadeep S Mariela Rex, MD;  [facility];  Service: Cardiovascular;  Laterality: N/A;  . CYSTOSCOPY W/ STONE MANIPULATION  1994  . INGUINAL HERNIA REPAIR Left 1972  . KNEE ARTHROSCOPY Bilateral 1994  . RIGHT/LEFT HEART CATH AND CORONARY ANGIOGRAPHY N/A 10/26/2016   Procedure: Right/Left Heart Cath and Coronary Angiography;  Surgeon: Corky CraftsJayadeep S Dahiana Kulak, MD;  [facility];  Service: Cardiovascular;  Laterality: N/A;  . TONSILLECTOMY  1950s     Current Outpatient Medications  Medication Sig Dispense Refill  . amLODipine (NORVASC) 5 MG tablet Take 5 mg by mouth daily.    Marland Kitchen. aspirin 81 MG tablet Take 81 mg by mouth daily.    . Calcium Carb-Cholecalciferol (CALCIUM-VITAMIN D3) 600-400 MG-UNIT CAPS Take 1 tablet by mouth daily.    . cholecalciferol (VITAMIN D) 1000 units  tablet Take 1,000 Units by mouth daily.    . clopidogrel (PLAVIX) 75 MG tablet Take 1 tablet (75 mg total) by mouth daily. 90 tablet 2  . FIBER SELECT GUMMIES PO Take 5 g by mouth daily.     . fluticasone (FLONASE) 50 MCG/ACT nasal spray Place 1 spray into both nostrils daily as needed for allergies or rhinitis.    Marland Kitchen. isosorbide mononitrate (IMDUR) 60 MG 24 hr tablet Take 1 tablet (60 mg total) by mouth daily. 90 tablet 1  . Levothyroxine Sodium 25 MCG CAPS Take 25 mcg by mouth daily before breakfast.     . Multiple Vitamins-Minerals (MULTIVITAMIN WITH MINERALS) tablet Take 1 tablet by mouth daily.    . pantoprazole (PROTONIX) 40 MG tablet Take 1 tablet (40 mg total) by mouth daily at 6 (six) AM. 90 tablet 2  . Pirfenidone 801 MG TABS Take 1 tablet by mouth 3 (three) times daily.     . pravastatin  (PRAVACHOL) 20 MG tablet Take 20 mg by mouth daily.    . metoprolol tartrate (LOPRESSOR) 25 MG tablet Take 1 tablet (25 mg total) by mouth 2 (two) times daily. 180 tablet 2  . nitroGLYCERIN (NITROSTAT) 0.4 MG SL tablet Place 1 tablet (0.4 mg total) under the tongue every 5 (five) minutes as needed for chest pain. 90 tablet 3   No current facility-administered medications for this visit.     Allergies:   Patient has no known allergies.    Social History:  The patient  reports that  has never smoked. he has never used smokeless tobacco. He reports that he does not drink alcohol or use drugs.   Family History:  The patient's family history includes Heart disease in his father and mother.    ROS:  Please see the history of present illness.   Otherwise, review of systems are positive for DOE.   All other systems are reviewed and negative.    PHYSICAL EXAM: VS:  BP 126/74   Pulse 70   Ht 5\' 11"  (1.803 m)   Wt 261 lb 4 oz (118.5 kg)   SpO2 94%   BMI 36.44 kg/m  , BMI Body mass index is 36.44 kg/m. GEN: Well nourished, well developed, in no acute distress  HEENT: normal  Neck: no JVD, carotid bruits, or masses Cardiac: RRR; no murmurs, rubs, or gallops,no edema  Respiratory:  clear to auscultation bilaterally, normal work of breathing GI: soft, nontender, nondistended, + BS MS: no deformity or atrophy ; 2+ PT, bilaterally, 2+ DP bilaterally Skin: warm and dry, no rash Neuro:  Strength and sensation are intact Psych: euthymic mood, full affect    Recent Labs: 11/06/2016: BUN 9; Creatinine, Ser 0.90; Hemoglobin 13.7; Platelets 224; Potassium 3.7; Sodium 135   Lipid Panel No results found for: CHOL, TRIG, HDL, CHOLHDL, VLDL, LDLCALC, LDLDIRECT   Other studies Reviewed: Additional studies/ records that were reviewed today with results demonstrating: cath results reviewed, distal PDA and PLA disease as well as ramus disease.   ASSESSMENT AND PLAN:  1. CAD: Angina medically  treated.  No symptoms like what he had before his two-vessel stenting back in April.  If he was to have a return of those symptoms, we would plan for repeat cardiac catheterization.  He understands this and does not feel that his symptoms are at that level. 2. HTN: Well-controlled.  Continue current medications. 3. Hyperlipidemia: He will need a recheck when he is fasting. 4. SHOB: Likely multifactorial, related to obesity  and IPF.  His sensation of his legs giving out does not appear to be related to a lack of blood flow to his legs as he has brisk pedal pulses bilaterally.   Current medicines are reviewed at length with the patient today.  The patient concerns regarding his medicines were addressed.  The following changes have been made:  No change  Labs/ tests ordered today include: Lipids No orders of the defined types were placed in this encounter.   Recommend 150 minutes/week of aerobic exercise Low fat, low carb, high fiber diet recommended  Disposition:   FU in 6 months   Signed, Lance Muss, MD  07/19/2017 2:42 PM    [facility] [address] [place], [place], [place]  [address] Phone: [phone]; Fax: [phone]

## 2017-07-22 ENCOUNTER — Other Ambulatory Visit: Payer: Medicare Other | Admitting: *Deleted

## 2017-07-22 DIAGNOSIS — E782 Mixed hyperlipidemia: Secondary | ICD-10-CM

## 2017-07-22 DIAGNOSIS — I25118 Atherosclerotic heart disease of native coronary artery with other forms of angina pectoris: Secondary | ICD-10-CM

## 2017-07-22 LAB — COMPREHENSIVE METABOLIC PANEL
A/G RATIO: 1.6 (ref 1.2–2.2)
ALBUMIN: 4.3 g/dL (ref 3.5–4.8)
ALT: 18 IU/L (ref 0–44)
AST: 16 IU/L (ref 0–40)
Alkaline Phosphatase: 54 IU/L (ref 39–117)
BUN / CREAT RATIO: 13 (ref 10–24)
BUN: 13 mg/dL (ref 8–27)
Bilirubin Total: 0.4 mg/dL (ref 0.0–1.2)
CALCIUM: 9.3 mg/dL (ref 8.6–10.2)
CO2: 23 mmol/L (ref 20–29)
Chloride: 101 mmol/L (ref 96–106)
Creatinine, Ser: 1 mg/dL (ref 0.76–1.27)
GFR, EST AFRICAN AMERICAN: 87 mL/min/{1.73_m2} (ref >59)
GFR, EST NON AFRICAN AMERICAN: 75 mL/min/{1.73_m2} (ref >59)
GLOBULIN, TOTAL: 2.7 g/dL (ref 1.5–4.5)
Glucose: 100 mg/dL — ABNORMAL HIGH (ref 65–99)
POTASSIUM: 4.1 mmol/L (ref 3.5–5.2)
SODIUM: 138 mmol/L (ref 134–144)
TOTAL PROTEIN: 7 g/dL (ref 6.0–8.5)

## 2017-07-22 LAB — LIPID PANEL
CHOLESTEROL TOTAL: 158 mg/dL (ref 100–199)
Chol/HDL Ratio: 5.1 ratio — ABNORMAL HIGH (ref 0.0–5.0)
HDL: 31 mg/dL — ABNORMAL LOW (ref >39)
LDL CALC: 94 mg/dL (ref 0–99)
TRIGLYCERIDES: 166 mg/dL — AB (ref 0–149)
VLDL Cholesterol Cal: 33 mg/dL (ref 5–40)

## 2017-09-27 ENCOUNTER — Other Ambulatory Visit: Payer: Self-pay | Admitting: Interventional Cardiology

## 2018-01-04 ENCOUNTER — Other Ambulatory Visit: Payer: Self-pay | Admitting: Interventional Cardiology

## 2018-03-20 ENCOUNTER — Other Ambulatory Visit: Payer: Self-pay | Admitting: Interventional Cardiology

## 2018-03-26 ENCOUNTER — Other Ambulatory Visit: Payer: Self-pay | Admitting: Interventional Cardiology

## 2018-05-23 ENCOUNTER — Other Ambulatory Visit: Payer: Self-pay | Admitting: Interventional Cardiology

## 2018-06-07 ENCOUNTER — Other Ambulatory Visit: Payer: Self-pay | Admitting: Interventional Cardiology

## 2018-06-15 ENCOUNTER — Telehealth: Payer: Self-pay

## 2018-06-15 NOTE — Telephone Encounter (Signed)
Patient has upcoming appointment with Dr. Eldridge DaceVaranasi in December. Clearance can be addressed then, or he will need earlier OV. He was to return here this past summer for an OV.   Rosalio MacadamiaLori C. Samona Chihuahua, RN, ANP-C [facility] [address] [place], [place]  [id number] [phone]

## 2018-06-15 NOTE — Telephone Encounter (Signed)
   [facility] Pre-operative Risk Assessment    Request for surgical clearance:  1. What type of surgery is being performed? Colonoscopy   2. When is this surgery scheduled? TBD   3. What type of clearance is required (medical clearance vs. Pharmacy clearance to hold med vs. Both)? Pharmacy  4. Are there any medications that need to be held prior to surgery and how long? Plavix 4 days prior    5. Practice name and name of physician performing surgery? [facility]... Dr. Shary Key   6. What is your office phone number [phone]    7.   What is your office fax number         [phone]  8.   Anesthesia type (None, local, MAC, general) ?

## 2018-06-15 NOTE — Telephone Encounter (Signed)
lvm for pt to call office regarding upcoming appt to keep with Dr. Seth BakeV in December or if pt needed something sooner and had to r/s appt. For upcoming surgical procedure.

## 2018-06-16 NOTE — Telephone Encounter (Signed)
Attempted to reach pt. Lvm instructing pt to call back if earlier office visit is needed for Surgical Clearance.

## 2018-06-17 NOTE — Telephone Encounter (Signed)
We have left messages x 3 to see if the pt wants a sooner appt in our office or if he is ok keeping the 12/19 with Dr[facility]asi> [facility] our office to let us know if he wants a sooner appt.

## 2018-06-20 ENCOUNTER — Other Ambulatory Visit: Payer: Self-pay | Admitting: Interventional Cardiology

## 2018-07-20 NOTE — Progress Notes (Signed)
Cardiology Office Note   Date:  07/21/2018   ID:  Douglas Lopez, DOB 14-Feb-1946, MRN [id number]  PCP:  Douglas Mis, MD    No chief complaint on file.  CAD  Wt Readings from Last 3 Encounters:  07/21/18 268 lb 3.2 oz (121.7 kg)  07/19/17 261 lb 4 oz (118.5 kg)  03/18/17 245 lb 12.8 oz (111.5 kg)       History of Present Illness: Douglas Lopez is a 72 y.o. male  Who had a chest CT which showed coronary calcifications back in 2017.  He went to pulmonary rehab. He had an irregular heart beat and was diagnosed with PVCs. He is in rehab for IPF. He does not use supplemental oxygen, but was told his life expectancy would be reduced due to the IPF.   He has never smoked. His father had CABG x 4, twice, MI at age 10. Mother had an MI. Younger brother has had an MI.   He had an abnormal stress test in 2017. THis led to a cath which showed multivessel CAD. He declined CABG due to his chronic lung disease and had PCI to the LAD, diagonal and circ. RCA disease was not treated.  No bleeding problems on DAPT.  He has had some angina which has been managed medically.   Since the last visit, he has not had angina.    Denies : Chest pain. Dizziness. Leg edema. Nitroglycerin use. Orthopnea. Palpitations. Paroxysmal nocturnal dyspnea. Shortness of breath. Syncope.   Now using oxygen 24/7.  Has not been exercising.  Wife had back surgery in 7/19 and they have been less active.     Past Medical History:  Diagnosis Date  . Arthritis    "joints" (11/05/2016)  . Bleeding stomach ulcer 1984  . Chronic bronchitis (HCC)   . Coronary artery disease    11/05/16 Cath with PCI and DES x1, Dist Crx, mLAD, dLAD  . High cholesterol   . History of blood transfusion 1984   "took 5 units for bleeding stomach ulcer"  . Hypertension   . Hypothyroidism   . Idiopathic pulmonary fibrosis (HCC)    dx'd 10/2015  . OSA on CPAP   . Pneumonia    "couple times in the last 5 years"  (11/05/2016)  . Right bundle branch block     Past Surgical History:  Procedure Laterality Date  . CATARACT EXTRACTION W/ INTRAOCULAR LENS  IMPLANT, BILATERAL Bilateral 1994  . CORONARY ANGIOPLASTY WITH STENT PLACEMENT  11/05/2016  . CORONARY STENT INTERVENTION N/A 11/05/2016   Procedure: Coronary Stent Intervention;  Surgeon: Corky Crafts, MD;  Location: [facility] INVASIVE CV LAB;  Service: Cardiovascular;  Laterality: N/A;  . CYSTOSCOPY W/ STONE MANIPULATION  1994  . INGUINAL HERNIA REPAIR Left 1972  . KNEE ARTHROSCOPY Bilateral 1994  . RIGHT/LEFT HEART CATH AND CORONARY ANGIOGRAPHY N/A 10/26/2016   Procedure: Right/Left Heart Cath and Coronary Angiography;  Surgeon: Corky Crafts, MD;  Location: [facility] INVASIVE CV LAB;  Service: Cardiovascular;  Laterality: N/A;  . TONSILLECTOMY  1950s     Current Outpatient Medications  Medication Sig Dispense Refill  . amLODipine (NORVASC) 5 MG tablet Take 5 mg by mouth daily.    Marland Kitchen aspirin 81 MG tablet Take 81 mg by mouth daily.    . clopidogrel (PLAVIX) 75 MG tablet Take 1 tablet (75 mg total) by mouth daily. 90 tablet 0  . fluticasone (FLONASE) 50 MCG/ACT nasal spray Place 1 spray into both nostrils daily as  needed for allergies or rhinitis.    Marland Kitchen isosorbide mononitrate (IMDUR) 60 MG 24 hr tablet Take 1 tablet (60 mg total) by mouth daily. Please keep upcoming appt in December for future refills. Thank you 90 tablet 0  . levothyroxine (SYNTHROID, LEVOTHROID) 50 MCG tablet Take 50 mcg by mouth daily before breakfast.    . metoprolol tartrate (LOPRESSOR) 25 MG tablet Take 1 tablet (25 mg total) by mouth 2 (two) times daily. 180 tablet 0  . Multiple Vitamins-Minerals (MULTIVITAMIN WITH MINERALS) tablet Take 1 tablet by mouth daily.    . nitroGLYCERIN (NITROSTAT) 0.4 MG SL tablet Place 1 tablet (0.4 mg total) under the tongue every 5 (five) minutes as needed for chest pain. Please make appt for December. 75 tablet 1  . pantoprazole (PROTONIX) 40 MG  tablet Take 1 tablet (40 mg total) by mouth daily. Please keep upcoming appt in December for future refills. Thank you 90 tablet 0  . Pirfenidone 801 MG TABS Take 1 tablet by mouth 3 (three) times daily.     . pravastatin (PRAVACHOL) 20 MG tablet Take 20 mg by mouth daily.     No current facility-administered medications for this visit.     Allergies:   Patient has no known allergies.    Social History:  The patient  reports that he has never smoked. He has never used smokeless tobacco. He reports that he does not drink alcohol or use drugs.   Family History:  The patient's family history includes Heart disease in his father and mother.    ROS:  Please see the history of present illness.   Otherwise, review of systems are positive for chronic SHOB.   All other systems are reviewed and negative.    PHYSICAL EXAM: VS:  BP 114/66   Pulse 65   Ht 5\' 11"  (1.803 m)   Wt 268 lb 3.2 oz (121.7 kg)   SpO2 94%   BMI 37.41 kg/m  , BMI Body mass index is 37.41 kg/m. GEN: Well nourished, well developed, in no acute distress  HEENT: normal  Neck: no JVD, carotid bruits, or masses Cardiac: RRR; no murmurs, rubs, or gallops,no edema  Respiratory:  clear to auscultation bilaterally, normal work of breathing GI: soft, nontender, nondistended, + BS, obese MS: no deformity or atrophy  Skin: warm and dry, no rash Neuro:  Strength and sensation are intact Psych: euthymic mood, full affect   EKG:   The ekg ordered today demonstrates NSR, RBBB, LAFB   Recent Labs: 07/22/2017: ALT 18; BUN 13; Creatinine, Ser 1.00; Potassium 4.1; Sodium 138   Lipid Panel    Component Value Date/Time   CHOL 158 07/22/2017 0931   TRIG 166 (H) 07/22/2017 0931   HDL 31 (L) 07/22/2017 0931   CHOLHDL 5.1 (H) 07/22/2017 0931   LDLCALC 94 07/22/2017 0931     Other studies Reviewed: Additional studies/ records that were reviewed today with results demonstrating: labs reviewed from 12/19 with  PMD..   ASSESSMENT AND PLAN:  1. CAD: No angina.  COntinue aggressive secondary prevention.  2. Hypertension: The current medical regimen is effective;  continue present plan and medications. 3. Hyperlipidemia: LDL controlled.  TG elevated in 12/19. 4. SHOB: Chronic.  Now on oxygen.  5. Syncope: No passing out, but has had occasional dizziness.  He sits and it resolves.  Encouraged him to stay hydrated. 6. He made it clear that he is a DNR as well.  He has discussed this at length with his  primary care doctor.  He states that he has paperwork as well.  Due to his pulmonary fibrosis, he made this decision.   Current medicines are reviewed at length with the patient today.  The patient concerns regarding his medicines were addressed.  The following changes have been made:  No change  Labs/ tests ordered today include:  No orders of the defined types were placed in this encounter.   Recommend 150 minutes/week of aerobic exercise Low fat, low carb, high fiber diet recommended  Disposition:   FU in 1 year   Signed, Lance MussJayadeep Grayling Schranz, MD  07/21/2018 9:11 AM    [facility] [address], [place], [place]  [address] Phone: [phone]; Fax: [phone]

## 2018-07-21 ENCOUNTER — Encounter: Payer: Self-pay | Admitting: Interventional Cardiology

## 2018-07-21 ENCOUNTER — Ambulatory Visit (INDEPENDENT_AMBULATORY_CARE_PROVIDER_SITE_OTHER): Payer: Medicare Other | Admitting: Interventional Cardiology

## 2018-07-21 VITALS — BP 114/66 | HR 65 | Ht 71.0 in | Wt 268.2 lb

## 2018-07-21 DIAGNOSIS — J84112 Idiopathic pulmonary fibrosis: Secondary | ICD-10-CM

## 2018-07-21 DIAGNOSIS — R0609 Other forms of dyspnea: Secondary | ICD-10-CM

## 2018-07-21 DIAGNOSIS — I25118 Atherosclerotic heart disease of native coronary artery with other forms of angina pectoris: Secondary | ICD-10-CM | POA: Diagnosis not present

## 2018-07-21 DIAGNOSIS — E782 Mixed hyperlipidemia: Secondary | ICD-10-CM | POA: Diagnosis not present

## 2018-07-21 NOTE — Patient Instructions (Signed)
Medication Instructions:  Your physician recommends that you continue on your current medications as directed. Please refer to the Current Medication list given to you today.  If you need a refill on your cardiac medications before your next appointment, please call your pharmacy.   Lab work: None Ordered  If you have labs (blood work) drawn today and your tests are completely normal, you will receive your results only by: Marland Kitchen. MyChart Message (if you have MyChart) OR . A paper copy in the mail If you have any lab test that is abnormal or we need to change your treatment, we will call you to review the results.  Testing/Procedures: None ordered  Follow-Up: At [facility], you and your health needs are our priority.  As part of our continuing mission to provide you with exceptional heart care, we have created designated Provider Care Teams.  These Care Teams include your primary Cardiologist (physician) and Advanced Practice Providers (APPs -  Physician Assistants and Nurse Practitioners) who all work together to provide you with the care you need, when you need it. . You will need a follow up appointment in 1 year.  Please call our office 2 months in advance to schedule this appointment.  You may see Everette RankJay Varanasi, MD or one of the following Advanced Practice Providers on your designated Care Team:   . Robbie LisBrittainy Simmons, PA-C . Dayna Dunn, PA-C . Jacolyn ReedyMichele Lenze, PA-C  Any Other Special Instructions Will Be Listed Below (If Applicable).   You should get 30 minutes a day of exercise 5 days a week.

## 2018-07-23 ENCOUNTER — Other Ambulatory Visit: Payer: Self-pay | Admitting: Interventional Cardiology

## 2018-09-23 ENCOUNTER — Other Ambulatory Visit: Payer: Self-pay | Admitting: Interventional Cardiology

## 2018-10-07 ENCOUNTER — Other Ambulatory Visit: Payer: Self-pay | Admitting: Interventional Cardiology

## 2019-06-07 ENCOUNTER — Other Ambulatory Visit: Payer: Self-pay | Admitting: Interventional Cardiology

## 2019-06-16 ENCOUNTER — Other Ambulatory Visit: Payer: Self-pay | Admitting: Interventional Cardiology

## 2019-07-03 NOTE — Progress Notes (Signed)
Cardiology Office Note   Date:  07/06/2019   ID:  Douglas MiyamotoDennis Lopez, DOB 1945/10/24, MRN [id number]  PCP:  [name], Kim K, MD    No chief complaint on file.  CAD  Wt Readings from Last 3 Encounters:  07/06/19 271 lb (122.9 kg)  07/21/18 268 lb 3.2 oz (121.7 kg)  07/19/17 261 lb 4 oz (118.5 kg)       History of Present Illness: Douglas Lopez is a 73 y.o. male  Who had a chest CT which showed coronary calcifications back in 2017.  He went to pulmonary rehab. He had an irregular heart beat and was diagnosed with PVCs. He is in rehab for IPF. He does not use supplemental oxygen, but was told his life expectancy would be reduced due to the IPF.   He has never smoked. His father had CABG x 4, twice, MI at age 73. Mother had an MI. Younger brother has had an MI.   He had an abnormal stress test in 2017. THis led to a cath which showed multivessel CAD. He declined CABG due to his chronic lung disease and had PCI to the LAD, diagonal and circ. RCA disease was not treated.  No bleeding problems on DAPT.  He has had some angina which has been managed medically.   At the last visit, he had not had angina.  Also noted that: "Now using oxygen 24/7.  Has not been exercising.  Wife had back surgery in 7/19 and they have been less active. "    Since the last visit, he does not some increased DOE from his chronic level of DOE when he rushes or if he is at a higer elevation.  He noted that [place] the [place], his breathing is worse.  Denies :  Dizziness. Leg edema. Nitroglycerin use. Orthopnea. Palpitations. Paroxysmal nocturnal dyspnea. Shortness of breath. Syncope.   Can have some chest pressure when his breathing is short.  Resolves when his breathing is better.  Sx resolve when he slows down and increases supplemental oxygen.   Not like what he had before PCI.  This sx several years ago were more severe.      Past Medical History:  Diagnosis Date  .  Arthritis    "joints" (11/05/2016)  . Bleeding stomach ulcer 1984  . Chronic bronchitis (HCC)   . Coronary artery disease    11/05/16 Cath with PCI and DES x1, Dist Crx, mLAD, dLAD  . High cholesterol   . History of blood transfusion 1984   "took 5 units for bleeding stomach ulcer"  . Hypertension   . Hypothyroidism   . Idiopathic pulmonary fibrosis (HCC)    dx'd 10/2015  . OSA on CPAP   . Pneumonia    "couple times in the last 5 years" (11/05/2016)  . Right bundle branch block     Past Surgical History:  Procedure Laterality Date  . CATARACT EXTRACTION W/ INTRAOCULAR LENS  IMPLANT, BILATERAL Bilateral 1994  . CORONARY ANGIOPLASTY WITH STENT PLACEMENT  11/05/2016  . CORONARY STENT INTERVENTION N/A 11/05/2016   Procedure: Coronary Stent Intervention;  Surgeon: Douglas CraftsJayadeep S Gerrick Ray, MD;  [facility];  Service: Cardiovascular;  Laterality: N/A;  . CYSTOSCOPY W/ STONE MANIPULATION  1994  . INGUINAL HERNIA REPAIR Left 1972  . KNEE ARTHROSCOPY Bilateral 1994  . RIGHT/LEFT HEART CATH AND CORONARY ANGIOGRAPHY N/A 10/26/2016   Procedure: Right/Left Heart Cath and Coronary Angiography;  Surgeon: Douglas CraftsJayadeep S Douglas Gorter, MD;  Location:  [place] INVASIVE CV LAB;  Service: Cardiovascular;  Laterality: N/A;  . TONSILLECTOMY  1950s     Current Outpatient Medications  Medication Sig Dispense Refill  . amLODipine (NORVASC) 5 MG tablet Take 5 mg by mouth daily.    Marland Kitchen aspirin 81 MG tablet Take 81 mg by mouth daily.    . clopidogrel (PLAVIX) 75 MG tablet Take 1 tablet (75 mg total) by mouth daily. Please keep upcoming appt with Dr. Irish Lack in December for future refills. Thank you 90 tablet 0  . fluticasone (FLONASE) 50 MCG/ACT nasal spray Place 1 spray into both nostrils daily as needed for allergies or rhinitis.    Marland Kitchen isosorbide mononitrate (IMDUR) 60 MG 24 hr tablet TAKE 1 TABLET EVERY DAY 90 tablet 0  . levothyroxine (SYNTHROID, LEVOTHROID) 50 MCG tablet Take 50 mcg by mouth daily before  breakfast.    . metoprolol tartrate (LOPRESSOR) 25 MG tablet Take 1 tablet (25 mg total) by mouth 2 (two) times daily. (BETA BLOCKER). Please keep upcoming appt with Dr. Irish Lack in December for future refills. Thank you 180 tablet 0  . Multiple Vitamins-Minerals (MULTIVITAMIN WITH MINERALS) tablet Take 1 tablet by mouth daily.    . nitroGLYCERIN (NITROSTAT) 0.4 MG SL tablet DISSOLVE 1 TABLET UNDER THE TONGUE EVERY 5 MINUTES AS NEEDED FOR CHEST PAIN. PLEASE MAKE APPOINTMENT FOR DECEMBER 24 tablet 5  . pantoprazole (PROTONIX) 40 MG tablet TAKE 1 TABLET EVERY DAY 90 tablet 0  . PARoxetine (PAXIL) 10 MG tablet Take by mouth.    . Pirfenidone 801 MG TABS Take 1 tablet by mouth 3 (three) times daily.     . pravastatin (PRAVACHOL) 20 MG tablet Take 20 mg by mouth daily.     No current facility-administered medications for this visit.     Allergies:   Patient has no known allergies.    Social History:  The patient  reports that he has never smoked. He has never used smokeless tobacco. He reports that he does not drink alcohol or use drugs.   Family History:  The patient's family history includes Heart disease in his father and mother.    ROS:  Please see the history of present illness.   Otherwise, review of systems are positive for weight gain.   All other systems are reviewed and negative.    PHYSICAL EXAM: VS:  BP 110/68   Pulse 65   Ht 5\' 11"  (1.803 m)   Wt 271 lb (122.9 kg)   SpO2 90%   BMI 37.80 kg/m  , BMI Body mass index is 37.8 kg/m. GEN: Well nourished, well developed, in no acute distress  HEENT: normal  Neck: no JVD, carotid bruits, or masses Cardiac: RRR; no murmurs, rubs, or gallops,no edema  Respiratory:  clear to auscultation bilaterally, normal work of breathing GI: soft, nontender, nondistended, + BS, obese MS: no deformity or atrophy  Skin: warm and dry, no rash Neuro:  Strength and sensation are intact Psych: euthymic mood, full affect   EKG:   The ekg  ordered today demonstrates NSR, RBBB, no ST changes   Recent Labs: No results found for requested labs within last 8760 hours.   Lipid Panel    Component Value Date/Time   CHOL 158 07/22/2017 0931   TRIG 166 (H) 07/22/2017 0931   HDL 31 (L) 07/22/2017 0931   CHOLHDL 5.1 (H) 07/22/2017 0931   LDLCALC 94 07/22/2017 0931     Other studies Reviewed: Additional studies/ records that were reviewed today with results  demonstrating: labs reviewed.   ASSESSMENT AND PLAN:  1. CAD: Angina, correlates with respiratory difficulty.  He does not feel this is his heart.  No sx like what he had before his stents.  If he feels that his sx get worse and are more cardiac related like prior to PCI, he will let us know.  WOuld consider further eval at that time.  2. HTN: The current medical regimen is effective;  continue present plan and medications. 3. Hyperlipidemia: LDL 97, TG 231 in 07/2019.  COntinue statin, healthy diet.  4. Chronic SHOB: now on oxygen. 5. DNR due to his pulmonary fibrosis.  He confirmed this diagnosis.    Current medicines are reviewed at length with the patient today.  The patient concerns regarding his medicines were addressed.  The following changes have been made:  No change  Labs/ tests ordered today include:  No orders of the defined types were placed in this encounter.   Recommend 150 minutes/week of aerobic exercise Low fat, low carb, high fiber diet recommended  Disposition:   FU in 1 year   Signed, Lance Muss, MD  07/06/2019 10:40 AM    [facility] [address] [place], [place], [place]  [address] Phone: [phone]; Fax: [phone]

## 2019-07-06 ENCOUNTER — Encounter: Payer: Self-pay | Admitting: Interventional Cardiology

## 2019-07-06 ENCOUNTER — Ambulatory Visit (INDEPENDENT_AMBULATORY_CARE_PROVIDER_SITE_OTHER): Payer: Medicare Other | Admitting: Interventional Cardiology

## 2019-07-06 ENCOUNTER — Other Ambulatory Visit: Payer: Self-pay

## 2019-07-06 VITALS — BP 110/68 | HR 65 | Ht 71.0 in | Wt 271.0 lb

## 2019-07-06 DIAGNOSIS — J84112 Idiopathic pulmonary fibrosis: Secondary | ICD-10-CM

## 2019-07-06 DIAGNOSIS — I25118 Atherosclerotic heart disease of native coronary artery with other forms of angina pectoris: Secondary | ICD-10-CM | POA: Diagnosis not present

## 2019-07-06 DIAGNOSIS — I10 Essential (primary) hypertension: Secondary | ICD-10-CM | POA: Diagnosis not present

## 2019-07-06 DIAGNOSIS — R0600 Dyspnea, unspecified: Secondary | ICD-10-CM | POA: Diagnosis not present

## 2019-07-06 DIAGNOSIS — E782 Mixed hyperlipidemia: Secondary | ICD-10-CM

## 2019-07-06 DIAGNOSIS — R0609 Other forms of dyspnea: Secondary | ICD-10-CM

## 2019-07-06 NOTE — Patient Instructions (Signed)

## 2019-08-25 ENCOUNTER — Other Ambulatory Visit: Payer: Self-pay | Admitting: Interventional Cardiology

## 2019-10-31 MED ORDER — NITROGLYCERIN 0.4 MG SL SUBL: SUBLINGUAL_TABLET | SUBLINGUAL | 1 refills | Status: AC

## 2020-03-28 ENCOUNTER — Emergency Department (HOSPITAL_COMMUNITY): Payer: Medicare Other

## 2020-03-28 ENCOUNTER — Encounter (HOSPITAL_COMMUNITY): Payer: Self-pay | Admitting: *Deleted

## 2020-03-28 ENCOUNTER — Inpatient Hospital Stay (HOSPITAL_COMMUNITY)
Admission: EM | Admit: 2020-03-28 | Discharge: 2020-04-02 | DRG: 196 | Disposition: A | Discharge status: Hospice | Payer: Medicare Other | Attending: Internal Medicine | Admitting: Internal Medicine

## 2020-03-28 ENCOUNTER — Other Ambulatory Visit: Payer: Self-pay

## 2020-03-28 DIAGNOSIS — I4510 Unspecified right bundle-branch block: Secondary | ICD-10-CM | POA: Diagnosis present

## 2020-03-28 DIAGNOSIS — D72829 Elevated white blood cell count, unspecified: Secondary | ICD-10-CM | POA: Diagnosis present

## 2020-03-28 DIAGNOSIS — E782 Mixed hyperlipidemia: Secondary | ICD-10-CM

## 2020-03-28 DIAGNOSIS — Z20822 Contact with and (suspected) exposure to covid-19: Secondary | ICD-10-CM | POA: Diagnosis present

## 2020-03-28 DIAGNOSIS — I25118 Atherosclerotic heart disease of native coronary artery with other forms of angina pectoris: Secondary | ICD-10-CM | POA: Diagnosis present

## 2020-03-28 DIAGNOSIS — Z79890 Hormone replacement therapy (postmenopausal): Secondary | ICD-10-CM

## 2020-03-28 DIAGNOSIS — J849 Interstitial pulmonary disease, unspecified: Secondary | ICD-10-CM | POA: Diagnosis present

## 2020-03-28 DIAGNOSIS — Z955 Presence of coronary angioplasty implant and graft: Secondary | ICD-10-CM | POA: Diagnosis not present

## 2020-03-28 DIAGNOSIS — Z9842 Cataract extraction status, left eye: Secondary | ICD-10-CM | POA: Diagnosis not present

## 2020-03-28 DIAGNOSIS — J9621 Acute and chronic respiratory failure with hypoxia: Secondary | ICD-10-CM | POA: Diagnosis present

## 2020-03-28 DIAGNOSIS — Z515 Encounter for palliative care: Secondary | ICD-10-CM | POA: Diagnosis not present

## 2020-03-28 DIAGNOSIS — Z79899 Other long term (current) drug therapy: Secondary | ICD-10-CM | POA: Diagnosis not present

## 2020-03-28 DIAGNOSIS — Z7982 Long term (current) use of aspirin: Secondary | ICD-10-CM

## 2020-03-28 DIAGNOSIS — Z8249 Family history of ischemic heart disease and other diseases of the circulatory system: Secondary | ICD-10-CM

## 2020-03-28 DIAGNOSIS — G4733 Obstructive sleep apnea (adult) (pediatric): Secondary | ICD-10-CM | POA: Diagnosis present

## 2020-03-28 DIAGNOSIS — K219 Gastro-esophageal reflux disease without esophagitis: Secondary | ICD-10-CM | POA: Diagnosis present

## 2020-03-28 DIAGNOSIS — I493 Ventricular premature depolarization: Secondary | ICD-10-CM | POA: Diagnosis present

## 2020-03-28 DIAGNOSIS — I5021 Acute systolic (congestive) heart failure: Secondary | ICD-10-CM | POA: Diagnosis not present

## 2020-03-28 DIAGNOSIS — J84112 Idiopathic pulmonary fibrosis: Secondary | ICD-10-CM | POA: Diagnosis present

## 2020-03-28 DIAGNOSIS — J479 Bronchiectasis, uncomplicated: Secondary | ICD-10-CM | POA: Diagnosis present

## 2020-03-28 DIAGNOSIS — E039 Hypothyroidism, unspecified: Secondary | ICD-10-CM | POA: Diagnosis present

## 2020-03-28 DIAGNOSIS — Z9981 Dependence on supplemental oxygen: Secondary | ICD-10-CM

## 2020-03-28 DIAGNOSIS — I77810 Thoracic aortic ectasia: Secondary | ICD-10-CM | POA: Diagnosis present

## 2020-03-28 DIAGNOSIS — G4700 Insomnia, unspecified: Secondary | ICD-10-CM | POA: Diagnosis not present

## 2020-03-28 DIAGNOSIS — Z7189 Other specified counseling: Secondary | ICD-10-CM

## 2020-03-28 DIAGNOSIS — Y92239 Unspecified place in hospital as the place of occurrence of the external cause: Secondary | ICD-10-CM | POA: Diagnosis not present

## 2020-03-28 DIAGNOSIS — Z961 Presence of intraocular lens: Secondary | ICD-10-CM | POA: Diagnosis present

## 2020-03-28 DIAGNOSIS — Z789 Other specified health status: Secondary | ICD-10-CM

## 2020-03-28 DIAGNOSIS — I452 Bifascicular block: Secondary | ICD-10-CM | POA: Diagnosis present

## 2020-03-28 DIAGNOSIS — Z9841 Cataract extraction status, right eye: Secondary | ICD-10-CM

## 2020-03-28 DIAGNOSIS — T380X5A Adverse effect of glucocorticoids and synthetic analogues, initial encounter: Secondary | ICD-10-CM | POA: Diagnosis not present

## 2020-03-28 DIAGNOSIS — I10 Essential (primary) hypertension: Secondary | ICD-10-CM | POA: Diagnosis present

## 2020-03-28 DIAGNOSIS — Z66 Do not resuscitate: Secondary | ICD-10-CM | POA: Diagnosis present

## 2020-03-28 DIAGNOSIS — R0602 Shortness of breath: Secondary | ICD-10-CM | POA: Diagnosis not present

## 2020-03-28 DIAGNOSIS — M8949 Other hypertrophic osteoarthropathy, multiple sites: Secondary | ICD-10-CM | POA: Diagnosis present

## 2020-03-28 DIAGNOSIS — F419 Anxiety disorder, unspecified: Secondary | ICD-10-CM | POA: Diagnosis present

## 2020-03-28 LAB — CBC
HCT: 36.7 % — ABNORMAL LOW (ref 39.0–52.0)
Hemoglobin: 11.8 g/dL — ABNORMAL LOW (ref 13.0–17.0)
MCH: 31.5 pg (ref 26.0–34.0)
MCHC: 32.2 g/dL (ref 30.0–36.0)
MCV: 97.9 fL (ref 80.0–100.0)
Platelets: 287 10*3/uL (ref 150–400)
RBC: 3.75 MIL/uL — ABNORMAL LOW (ref 4.22–5.81)
RDW: 13.9 % (ref 11.5–15.5)
WBC: 9 10*3/uL (ref 4.0–10.5)
nRBC: 0 % (ref 0.0–0.2)

## 2020-03-28 LAB — I-STAT ARTERIAL BLOOD GAS, ED
Acid-Base Excess: 8 mmol/L — ABNORMAL HIGH (ref 0.0–2.0)
Bicarbonate: 32.4 mmol/L — ABNORMAL HIGH (ref 20.0–28.0)
Calcium, Ion: 1.21 mmol/L (ref 1.15–1.40)
HCT: 39 % (ref 39.0–52.0)
Hemoglobin: 13.3 g/dL (ref 13.0–17.0)
O2 Saturation: 99 %
Potassium: 3.7 mmol/L (ref 3.5–5.1)
Sodium: 140 mmol/L (ref 135–145)
TCO2: 34 mmol/L — ABNORMAL HIGH (ref 22–32)
pCO2 arterial: 45.3 mmHg (ref 32.0–48.0)
pH, Arterial: 7.462 — ABNORMAL HIGH (ref 7.350–7.450)
pO2, Arterial: 110 mmHg — ABNORMAL HIGH (ref 83.0–108.0)

## 2020-03-28 LAB — SARS CORONAVIRUS 2 BY RT PCR (HOSPITAL ORDER, PERFORMED IN ~~LOC~~ HOSPITAL LAB): SARS Coronavirus 2: NEGATIVE

## 2020-03-28 LAB — TROPONIN I (HIGH SENSITIVITY)
Troponin I (High Sensitivity): 14 ng/L (ref <18)
Troponin I (High Sensitivity): 18 ng/L — ABNORMAL HIGH (ref <18)

## 2020-03-28 LAB — BASIC METABOLIC PANEL
Anion gap: 12 (ref 5–15)
BUN: 15 mg/dL (ref 8–23)
CO2: 25 mmol/L (ref 22–32)
Calcium: 9.5 mg/dL (ref 8.9–10.3)
Chloride: 104 mmol/L (ref 98–111)
Creatinine, Ser: 1.05 mg/dL (ref 0.61–1.24)
GFR calc Af Amer: >60 mL/min (ref >60)
GFR calc non Af Amer: >60 mL/min (ref >60)
Glucose, Bld: 149 mg/dL — ABNORMAL HIGH (ref 70–99)
Potassium: 3.9 mmol/L (ref 3.5–5.1)
Sodium: 141 mmol/L (ref 135–145)

## 2020-03-28 LAB — BRAIN NATRIURETIC PEPTIDE: B Natriuretic Peptide: 105.2 pg/mL — ABNORMAL HIGH (ref 0.0–100.0)

## 2020-03-28 MED ORDER — PRAVASTATIN SODIUM 10 MG PO TABS
20.0000 mg | ORAL_TABLET | Freq: Every day | ORAL | Status: DC
  Administered 2020-03-29 – 2020-04-02 (×5): 20 mg via ORAL
  Filled 2020-03-28 (×5): qty 2

## 2020-03-28 MED ORDER — HYDROCODONE-ACETAMINOPHEN 5-325 MG PO TABS: 1.0000 | ORAL_TABLET | ORAL | Status: DC | PRN

## 2020-03-28 MED ORDER — METHYLPREDNISOLONE SODIUM SUCC 125 MG IJ SOLR
125.0000 mg | Freq: Once | INTRAMUSCULAR | Status: AC
  Administered 2020-03-28: 125 mg via INTRAVENOUS
  Filled 2020-03-28: qty 2

## 2020-03-28 MED ORDER — ENOXAPARIN SODIUM 40 MG/0.4ML ~~LOC~~ SOLN
40.0000 mg | Freq: Every day | SUBCUTANEOUS | Status: DC
  Administered 2020-03-28 – 2020-04-01 (×5): 40 mg via SUBCUTANEOUS
  Filled 2020-03-28 (×5): qty 0.4

## 2020-03-28 MED ORDER — FUROSEMIDE 10 MG/ML IJ SOLN
40.0000 mg | Freq: Once | INTRAMUSCULAR | Status: AC
  Administered 2020-03-29: 40 mg via INTRAVENOUS
  Filled 2020-03-28: qty 4

## 2020-03-28 MED ORDER — NITROGLYCERIN 0.4 MG SL SUBL: 0.4000 mg | SUBLINGUAL_TABLET | SUBLINGUAL | Status: DC | PRN

## 2020-03-28 MED ORDER — METOPROLOL TARTRATE 25 MG PO TABS
25.0000 mg | ORAL_TABLET | Freq: Two times a day (BID) | ORAL | Status: DC
  Administered 2020-03-28 – 2020-03-31 (×6): 25 mg via ORAL
  Filled 2020-03-28 (×6): qty 1

## 2020-03-28 MED ORDER — GUAIFENESIN-CODEINE 100-10 MG/5ML PO SOLN
10.0000 mL | ORAL | Status: DC | PRN
  Administered 2020-03-28: 10 mL via ORAL
  Filled 2020-03-28 (×2): qty 10

## 2020-03-28 MED ORDER — FUROSEMIDE 10 MG/ML IJ SOLN
40.0000 mg | Freq: Once | INTRAMUSCULAR | Status: AC
  Administered 2020-03-28: 40 mg via INTRAVENOUS
  Filled 2020-03-28: qty 4

## 2020-03-28 MED ORDER — ONDANSETRON HCL 4 MG PO TABS: 4.0000 mg | ORAL_TABLET | Freq: Four times a day (QID) | ORAL | Status: DC | PRN

## 2020-03-28 MED ORDER — METHYLPREDNISOLONE SODIUM SUCC 125 MG IJ SOLR
60.0000 mg | Freq: Three times a day (TID) | INTRAMUSCULAR | Status: AC
  Administered 2020-03-29 – 2020-03-30 (×6): 60 mg via INTRAVENOUS
  Filled 2020-03-28 (×6): qty 2

## 2020-03-28 MED ORDER — ADULT MULTIVITAMIN W/MINERALS CH
1.0000 | ORAL_TABLET | Freq: Every day | ORAL | Status: DC
  Administered 2020-03-28 – 2020-04-02 (×6): 1 via ORAL
  Filled 2020-03-28 (×6): qty 1

## 2020-03-28 MED ORDER — LEVOFLOXACIN IN D5W 750 MG/150ML IV SOLN
750.0000 mg | INTRAVENOUS | Status: DC
  Administered 2020-03-29: 750 mg via INTRAVENOUS
  Filled 2020-03-28: qty 150

## 2020-03-28 MED ORDER — IOHEXOL 350 MG/ML SOLN
75.0000 mL | Freq: Once | INTRAVENOUS | Status: AC | PRN
  Administered 2020-03-28: 75 mL via INTRAVENOUS

## 2020-03-28 MED ORDER — ACETAMINOPHEN 650 MG RE SUPP: 650.0000 mg | Freq: Four times a day (QID) | RECTAL | Status: DC | PRN

## 2020-03-28 MED ORDER — ISOSORBIDE MONONITRATE ER 60 MG PO TB24
60.0000 mg | ORAL_TABLET | Freq: Every day | ORAL | Status: DC
  Administered 2020-03-29 – 2020-04-02 (×5): 60 mg via ORAL
  Filled 2020-03-28 (×2): qty 1
  Filled 2020-03-28: qty 2
  Filled 2020-03-28 (×2): qty 1

## 2020-03-28 MED ORDER — POLYETHYLENE GLYCOL 3350 17 G PO PACK
17.0000 g | PACK | Freq: Every day | ORAL | Status: DC | PRN
  Administered 2020-03-31: 17 g via ORAL
  Filled 2020-03-28: qty 1

## 2020-03-28 MED ORDER — ASPIRIN EC 81 MG PO TBEC
81.0000 mg | DELAYED_RELEASE_TABLET | Freq: Every day | ORAL | Status: DC
  Administered 2020-03-29 – 2020-04-02 (×5): 81 mg via ORAL
  Filled 2020-03-28 (×6): qty 1

## 2020-03-28 MED ORDER — LEVOTHYROXINE SODIUM 50 MCG PO TABS
50.0000 ug | ORAL_TABLET | Freq: Every day | ORAL | Status: DC
  Administered 2020-03-29 – 2020-04-02 (×5): 50 ug via ORAL
  Filled 2020-03-28 (×5): qty 1

## 2020-03-28 MED ORDER — PIRFENIDONE 801 MG PO TABS: 1.0000 | ORAL_TABLET | Freq: Three times a day (TID) | ORAL | Status: DC

## 2020-03-28 MED ORDER — IPRATROPIUM-ALBUTEROL 0.5-2.5 (3) MG/3ML IN SOLN
3.0000 mL | RESPIRATORY_TRACT | Status: DC | PRN
  Administered 2020-03-29: 3 mL via RESPIRATORY_TRACT
  Filled 2020-03-28: qty 3

## 2020-03-28 MED ORDER — PANTOPRAZOLE SODIUM 40 MG PO TBEC
40.0000 mg | DELAYED_RELEASE_TABLET | Freq: Every day | ORAL | Status: DC
  Administered 2020-03-29 – 2020-04-02 (×5): 40 mg via ORAL
  Filled 2020-03-28 (×5): qty 1

## 2020-03-28 MED ORDER — AMLODIPINE BESYLATE 5 MG PO TABS
5.0000 mg | ORAL_TABLET | Freq: Every day | ORAL | Status: DC
  Administered 2020-03-29 – 2020-04-02 (×5): 5 mg via ORAL
  Filled 2020-03-28 (×5): qty 1

## 2020-03-28 MED ORDER — CLOPIDOGREL BISULFATE 75 MG PO TABS
75.0000 mg | ORAL_TABLET | Freq: Every day | ORAL | Status: DC
  Administered 2020-03-29 – 2020-04-02 (×5): 75 mg via ORAL
  Filled 2020-03-28 (×5): qty 1

## 2020-03-28 MED ORDER — ACETAMINOPHEN 325 MG PO TABS: 650.0000 mg | ORAL_TABLET | Freq: Four times a day (QID) | ORAL | Status: DC | PRN

## 2020-03-28 MED ORDER — LEVOFLOXACIN IN D5W 750 MG/150ML IV SOLN
750.0000 mg | Freq: Once | INTRAVENOUS | Status: AC
  Administered 2020-03-28: 750 mg via INTRAVENOUS
  Filled 2020-03-28: qty 150

## 2020-03-28 MED ORDER — ONDANSETRON HCL 4 MG/2ML IJ SOLN
4.0000 mg | Freq: Four times a day (QID) | INTRAMUSCULAR | Status: DC | PRN
  Administered 2020-03-30: 4 mg via INTRAVENOUS
  Filled 2020-03-28: qty 2

## 2020-03-28 MED ORDER — FLUTICASONE PROPIONATE 50 MCG/ACT NA SUSP
1.0000 | Freq: Every day | NASAL | Status: DC | PRN
  Administered 2020-03-31: 1 via NASAL
  Filled 2020-03-28: qty 16

## 2020-03-28 NOTE — H&P (Addendum)
History and Physical    Douglas Lopez AYO:[id number] DOB: Dec 19, 1945 DOA: 03/28/2020  PCP: Macy Mis, MD  Patient coming from: Dr. Dennard Nip office (pulmonology)   Chief Complaint:  Chief Complaint  Patient presents with  . Shortness of Breath     HPI:    74 year old male with past medical history of idiopathic pulmonary fibrosis, hypertension, gastroesophageal reflux disease, hyperlipidemia, coronary artery disease status post cath with stent x 3 (11/2016), chronic respiratory failure (currently on 8-9LPM at rest at home), hypothyroidism who presents to [facility] emergency department from his pulmonologist office due to progressively worsening shortness of breath.  Patient explains that for the past 3 months, his shortness of breath and oxygen requirements have gradually increased.  Patient was using 4 L of oxygen via nasal cannula in May, followed by 8 L in June.  Patient is now using 9 L of oxygen via nasal cannula as of early August.  Patient explains that for approximately the past 4 days he has had a rapid acceleration in the degree of shortness of breath he is experiencing.  Shortness of breath is constant severe in intensity, worse with minimal exertion with only some improvement with rest.  Patient denies any associated chest pain.  Patient does complain of severe, typically nonproductive cough that has worsened over the span of time.  Patient is complaining of associated left flank and abdominal pain occurring with deep inspiration and cough.  Patient denies fevers, sick contacts, recent travel or confirmed contact with COVID-19 infection.  As patient symptoms worsened in the following days, patient noticed occasional bouts of severe hypoxia, as low as 60% with exertion.  Of note, patient is status post Moderna COVID-19 vaccination in February and March 2021.  Patient initially presented to see his pulmonologist Dr. Ileene Hutchinson on 8/26.  Due to observed respiratory distress  and hypoxia in the pulmonology clinic the patient was sent to [facility] emergency department for evaluation and administration of high-dose steroids, antibiotic therapy and diuretics.  Upon evaluation in the emergency department patient was found to be in respiratory distress.  CT imaging of the chest was performed revealing mediastinal hilar adenopathy with bilateral groundglass opacities, particularly in the right upper lobe and right middle lobe.  Patient was placed on intravenous levofloxacin, intravenous Solu-Medrol and intravenous Lasix.  The hospitalist group was then called to assess the patient for admission to the hospital.  Review of Systems:   Review of Systems  Constitutional: Positive for malaise/fatigue.  Respiratory: Positive for cough and shortness of breath.   Neurological: Positive for weakness.  All other systems reviewed and are negative.     Past Medical History:  Diagnosis Date  . Arthritis    "joints" (11/05/2016)  . Bleeding stomach ulcer 1984  . Chronic bronchitis (HCC)   . Coronary artery disease    11/05/16 Cath with PCI and DES x1, Dist Crx, mLAD, dLAD  . High cholesterol   . History of blood transfusion 1984   "took 5 units for bleeding stomach ulcer"  . Hypertension   . Hypothyroidism   . Idiopathic pulmonary fibrosis (HCC)    dx'd 10/2015  . OSA on CPAP   . Pneumonia    "couple times in the last 5 years" (11/05/2016)  . Right bundle branch block     Past Surgical History:  Procedure Laterality Date  . CATARACT EXTRACTION W/ INTRAOCULAR LENS  IMPLANT, BILATERAL Bilateral 1994  . CORONARY ANGIOPLASTY WITH STENT PLACEMENT  11/05/2016  . CORONARY  STENT INTERVENTION N/A 11/05/2016   Procedure: Coronary Stent Intervention;  Surgeon: Corky Crafts, MD;  Location: [facility] INVASIVE CV LAB;  Service: Cardiovascular;  Laterality: N/A;  . CYSTOSCOPY W/ STONE MANIPULATION  1994  . INGUINAL HERNIA REPAIR Left 1972  . KNEE ARTHROSCOPY Bilateral 1994  .  RIGHT/LEFT HEART CATH AND CORONARY ANGIOGRAPHY N/A 10/26/2016   Procedure: Right/Left Heart Cath and Coronary Angiography;  Surgeon: Corky Crafts, MD;  Location: [facility] INVASIVE CV LAB;  Service: Cardiovascular;  Laterality: N/A;  . TONSILLECTOMY  1950s     reports that he has never smoked. He has never used smokeless tobacco. He reports that he does not drink alcohol and does not use drugs.  No Known Allergies  Family History  Problem Relation Age of Onset  . Heart disease Mother   . Heart disease Father      Prior to Admission medications   Medication Sig Start Date End Date Taking? Authorizing Provider  amLODipine (NORVASC) 5 MG tablet Take 5 mg by mouth daily. 04/13/16   [provider]  aspirin 81 MG tablet Take 81 mg by mouth daily.    [provider]  clopidogrel (PLAVIX) 75 MG tablet Take 1 tablet (75 mg total) by mouth daily. 08/25/19   Corky Crafts, MD  fluticasone [facility]) 50 MCG/ACT nasal spray Place 1 spray into both nostrils daily as needed for allergies or rhinitis.    [provider]  isosorbide mononitrate (IMDUR) 60 MG 24 hr tablet TAKE 1 TABLET EVERY DAY 08/25/19   Corky Crafts, MD  levothyroxine (SYNTHROID, LEVOTHROID) 50 MCG tablet Take 50 mcg by mouth daily before breakfast.    [provider]  metoprolol tartrate (LOPRESSOR) 25 MG tablet Take 1 tablet (25 mg total) by mouth 2 (two) times daily. 08/25/19   Corky Crafts, MD  Multiple Vitamins-Minerals (MULTIVITAMIN WITH MINERALS) tablet Take 1 tablet by mouth daily.    [provider]  nitroGLYCERIN (NITROSTAT) 0.4 MG SL tablet DISSOLVE 1 TABLET UNDER THE TONGUE EVERY 5 MINUTES AS NEEDED FOR CHEST PAIN. PLEASE MAKE APPOINTMENT FOR DECEMBER 10/31/19   Corky Crafts, MD  pantoprazole (PROTONIX) 40 MG tablet TAKE 1 TABLET EVERY DAY 08/25/19   Corky Crafts, MD  PARoxetine (PAXIL) 10 MG tablet Take by mouth. 07/05/19   [provider]    Pirfenidone 801 MG TABS Take 1 tablet by mouth 3 (three) times daily.     [provider]  pravastatin (PRAVACHOL) 20 MG tablet Take 20 mg by mouth daily.    [provider]    Physical Exam: Vitals:   03/28/20 1608 03/28/20 1717 03/28/20 1815 03/28/20 1830  BP:  132/83 (!) 154/85 (!) 160/68  Pulse:  89 91 (!) 58  Resp:  (!) 22  (!) 23  Temp:      TempSrc:      SpO2: 97% 100% 96% 100%    Constitutional: Acute alert and oriented x3, patient continues to be in respiratory distress.  Patient is obese. Skin: no rashes, no lesions, good skin turgor noted. Eyes: Pupils are equally reactive to light.  No evidence of scleral icterus or conjunctival pallor.  ENMT: Moist mucous membranes noted.  Posterior pharynx clear of any exudate or lesions.   Neck: normal, supple, no masses, no thyromegaly.  No evidence of jugular venous distension.   Respiratory: Significant diffuse rales in all fields bilaterally with associated rhonchi.  No evidence of wheezing at this time.  Patient is exhibiting  significant tachypnea without accessory muscle use.  Cardiovascular: Regular rate and rhythm, no murmurs / rubs / gallops. No extremity edema. 2+ pedal pulses. No carotid bruits.  Chest:   Nontender without crepitus or deformity.   Back:   Nontender without crepitus or deformity. Abdomen: Abdomen is soft and nontender.  No evidence of intra-abdominal masses.  Positive bowel sounds noted in all quadrants.   Musculoskeletal: No joint deformity upper and lower extremities. Good ROM, no contractures. Normal muscle tone.  Neurologic: CN 2-12 grossly intact. Sensation intact, strength noted to be 5 out of 5 in all 4 extremities.  Patient is following all commands.  Patient is responsive to verbal stimuli.   Psychiatric: Patient presents as a normal mood with appropriate affect.  Patient seems to possess insight as to theircurrent situation.     Labs on Admission: I have personally reviewed  following labs and imaging studies -   CBC: Recent Labs  Lab 03/28/20 1644  WBC 9.0  HGB 11.8*  HCT 36.7*  MCV 97.9  PLT 287   Basic Metabolic Panel: Recent Labs  Lab 03/28/20 1644  NA 141  K 3.9  CL 104  CO2 25  GLUCOSE 149*  BUN 15  CREATININE 1.05  CALCIUM 9.5   GFR: CrCl cannot be calculated (Unknown ideal weight.). Liver Function Tests: No results for input(s): AST, ALT, ALKPHOS, BILITOT, PROT, ALBUMIN in the last 168 hours. No results for input(s): LIPASE, AMYLASE in the last 168 hours. No results for input(s): AMMONIA in the last 168 hours. Coagulation Profile: No results for input(s): INR, PROTIME in the last 168 hours. Cardiac Enzymes: No results for input(s): CKTOTAL, CKMB, CKMBINDEX, TROPONINI in the last 168 hours. BNP (last 3 results) No results for input(s): PROBNP in the last 8760 hours. HbA1C: No results for input(s): HGBA1C in the last 72 hours. CBG: No results for input(s): GLUCAP in the last 168 hours. Lipid Profile: No results for input(s): CHOL, HDL, LDLCALC, TRIG, CHOLHDL, LDLDIRECT in the last 72 hours. Thyroid Function Tests: No results for input(s): TSH, T4TOTAL, FREET4, T3FREE, THYROIDAB in the last 72 hours. Anemia Panel: No results for input(s): VITAMINB12, FOLATE, FERRITIN, TIBC, IRON, RETICCTPCT in the last 72 hours. Urine analysis: No results found for: COLORURINE, APPEARANCEUR, LABSPEC, PHURINE, GLUCOSEU, HGBUR, BILIRUBINUR, KETONESUR, PROTEINUR, UROBILINOGEN, NITRITE, LEUKOCYTESUR  Radiological Exams on Admission - Personally Reviewed: DG Chest 2 View  Result Date: 03/28/2020 CLINICAL DATA:  Shortness of breath, cough. EXAM: CHEST - 2 VIEW COMPARISON:  None. FINDINGS: Mild cardiomegaly is noted. No pneumothorax is noted. Diffuse reticular densities are noted throughout both lungs, most prominently seen in the lung bases, concerning for chronic interstitial lung disease, although superimposed acute inflammation or edema cannot be  excluded. No significant effusion is noted. Bony thorax is unremarkable. IMPRESSION: Diffuse reticular densities are noted throughout both lungs, most prominently seen in the lung bases, concerning for chronic interstitial lung disease, although superimposed acute inflammation or edema cannot be excluded. Electronically Signed   By: Lupita RaiderJames  Green Jr M.D.   On: 03/28/2020 16:50   CT Angio Chest PE W and/or Wo Contrast  Result Date: 03/28/2020 CLINICAL DATA:  PE suspected, high prob Shortness of breath. EXAM: CT ANGIOGRAPHY CHEST WITH CONTRAST TECHNIQUE: Multidetector CT imaging of the chest was performed using the standard protocol during bolus administration of intravenous contrast. Multiplanar CT image reconstructions and MIPs were obtained to evaluate the vascular anatomy. CONTRAST:  75mL OMNIPAQUE IOHEXOL 350 MG/ML SOLN COMPARISON:  Radiograph earlier this day. No  remote exams available for comparison. Report from high-resolution chest CT February 2017 at an outside institution, images not available. FINDINGS: Cardiovascular: There are no filling defects within the pulmonary arteries to suggest pulmonary embolus. Breathing motion and contrast bolus timing limits evaluation particularly at the lung bases. Aortic atherosclerosis and tortuosity. No dissection. Conventional branching pattern from the aortic arch. Borderline cardiomegaly. There are coronary artery calcifications. No pericardial effusion. Mediastinum/Nodes: Shotty mediastinal adenopathy with multiple nodes measuring up to 11 mm most prominently involving the upper mediastinum. There are small bilateral hilar nodes. Small calcified left hilar nodes. No nodes larger than 15 mm short axis. There is a small hiatal hernia. No thyroid nodule. Lungs/Pleura: Subpleural reticulation with multifocal bronchiectasis, detailed evaluation is obscured by motion. There is a slight basilar predominant distribution. Geographic ground-glass opacity in the right upper  lobe perihilar right middle lobe. No dominant pulmonary mass or consolidative airspace disease. No pleural effusion. Upper Abdomen: Hepatic steatosis.  No acute findings. Musculoskeletal: Multilevel osteophytes and degenerative change in the spine. There are no acute or suspicious osseous abnormalities. Review of the MIP images confirms the above findings. IMPRESSION: 1. No pulmonary embolus. 2. There is background interstitial lung disease with subpleural reticulation and bronchiectasis, also reported on prior exam. Geographic ground-glass opacity in the right upper lobe and perihilar right middle lobe. This may be related to interstitial lung disease or a separate process, either infectious or inflammatory. Pulmonary edema is felt less likely given the symmetry. 3. Shotty mediastinal and hilar adenopathy, likely reactive. 4. Coronary artery calcifications. 5. Incidental hepatic steatosis. Aortic Atherosclerosis (ICD10-I70.0). Electronically Signed   By: Narda Rutherford M.D.   On: 03/28/2020 19:29    EKG: Personally reviewed.  Rhythm is normal sinus rhythm with heart rate of 90 bpm.  Frequent PVCs with notable bifascicular block and right bundle branch block.  Assessment/Plan   Acute exacerbation of idiopathic pulmonary fibrosis (HCC)   Patient presenting with several month history of progressive shortness of breath and dyspnea on exertion, symptoms have particularly worsened over the past few days.  CT imaging is seemingly consistent with progressive pulmonary fibrosis although a superimposed atypical infectious process or pulmonary edema is possible.  In review of Dr. Dennard Nip pulmonology note from 8/26, he recommends initiation of a combination of high-dose steroid therapy, intravenous diuretics and antibiotic therapy.  Placing patient on Solu-Medrol 60 mg every 8, a trial of intravenous Lasix, as well as intravenous Levaquin.  Ordering as needed bronchodilator therapy  Ordered as needed  antitussive therapy  Provided patient with supplemental oxygen necessary to achieve oxygen saturations of between 88 and 92%.  Patient is typically on CPAP nightly, 16 cmH2O -we will admit to progressive care unit in case noninvasive positive pressure ventilation needs to be used more often.  Obtaining ABG  Obtain echo in the morning  Placing patient on aggressive  We will likely obtain pulmonology consultation in the morning.  Active Problems:    Acute on chronic respiratory failure with Hypoxia (HCC)  Please see assessment and plan above    Coronary artery disease of native artery of native heart with stable angina pectoris [facility])   Patient is currently chest pain-free  Monitoring patient on telemetry  Continuing statin, beta-blocker, antiplatelet therapy    Essential hypertension   Continue home regimen amlodipine, metoprolol    Hypothyroidism   Continue home regimen of Synthroid    Mixed hyperlipidemia   Continue home regimen of statin therapy    GERD without esophagitis   Continue  home regimen of PPI  Goals of Care   Patient is suffering from advanced sequela of idiopathic pulmonary fibrosis, Prognosis is rather poor  If patient does not respond to current plan of care with steroids, diuretics and antibiotics, hospice may be an appropriate option  I have discussed this at length with the patient, he is open to the possibility of hospice if his symptoms do not rapidly improve.  We will order palliative care consultation.  Patient is currently DNR/DNI   Code Status: DNR and DNI Family Communication: Deferred   Status is: Inpatient  Remains inpatient appropriate because:IV treatments appropriate due to intensity of illness or inability to take PO and Inpatient level of care appropriate due to severity of illness   Dispo: The patient is from: Home              Anticipated d/c is to: Home              Anticipated d/c date is: > 3 days               Patient currently is not medically stable to d/c.        Marinda Elk MD [facility] Pager [phone]  If 7PM-7AM, please contact night-coverage [email or web address] Use universal [place] password for that web site. If you do not have the password, please call the hospital operator.  03/28/2020, 8:52 PM

## 2020-03-28 NOTE — ED Provider Notes (Signed)
[facility] EMERGENCY DEPARTMENT Provider Note  CSN: [id number] Arrival date & time: 03/28/20 1550    History Chief Complaint  Patient presents with  . Shortness of Breath    HPI  Douglas Lopez is a 74 y.o. male with a history of severe interstitial lung diseases, followed by Pulmonology at [facility], who wears 10L [place] oxygen at home which is the maximum he can get from his concentrator. Over the last few days he has had increased SOB, worse with exertion and hypoxia to the 60s with walking a short distance at his house. He was seen at his pulmonologist's office today where he had desaturations into the low 80s on 15L/min, recommended admission to the hospital, patient and wife elected to come here due to proximity to their home. Pulmonologist also recommended CTA, high dose steroids, antibiotics and diuresis. Patient reports mild occasional cough, no fever, no known Covid exposures.    Past Medical History:  Diagnosis Date  . Arthritis    "joints" (11/05/2016)  . Bleeding stomach ulcer 1984  . Chronic bronchitis (HCC)   . Coronary artery disease    11/05/16 Cath with PCI and DES x1, Dist Crx, mLAD, dLAD  . High cholesterol   . History of blood transfusion 1984   "took 5 units for bleeding stomach ulcer"  . Hypertension   . Hypothyroidism   . Idiopathic pulmonary fibrosis (HCC)    dx'd 10/2015  . OSA on CPAP   . Pneumonia    "couple times in the last 5 years" (11/05/2016)  . Right bundle branch block     Past Surgical History:  Procedure Laterality Date  . CATARACT EXTRACTION W/ INTRAOCULAR LENS  IMPLANT, BILATERAL Bilateral 1994  . CORONARY ANGIOPLASTY WITH STENT PLACEMENT  11/05/2016  . CORONARY STENT INTERVENTION N/A 11/05/2016   Procedure: Coronary Stent Intervention;  Surgeon: Corky Crafts, MD;  Location: [facility] [place] INVASIVE CV LAB;  Service: Cardiovascular;  Laterality: N/A;  . CYSTOSCOPY W/ STONE MANIPULATION  1994  . INGUINAL HERNIA REPAIR Left 1972  . KNEE ARTHROSCOPY  Bilateral 1994  . RIGHT/LEFT HEART CATH AND CORONARY ANGIOGRAPHY N/A 10/26/2016   Procedure: Right/Left Heart Cath and Coronary Angiography;  Surgeon: Corky Crafts, MD;  Location: [facility] INVASIVE CV LAB;  Service: Cardiovascular;  Laterality: N/A;  . TONSILLECTOMY  1950s    Family History  Problem Relation Age of Onset  . Heart disease Mother   . Heart disease Father     Social History   Tobacco Use  . Smoking status: Never Smoker  . Smokeless tobacco: Never Used  Vaping Use  . Vaping Use: Never used  Substance Use Topics  . Alcohol use: No  . Drug use: No     Home Medications Prior to Admission medications   Medication Sig Start Date End Date Taking? Authorizing Provider  amLODipine (NORVASC) 5 MG tablet Take 5 mg by mouth daily. 04/13/16   [provider]  aspirin 81 MG tablet Take 81 mg by mouth daily.    [provider]  clopidogrel (PLAVIX) 75 MG tablet Take 1 tablet (75 mg total) by mouth daily. 08/25/19   Corky Crafts, MD  fluticasone [facility]) 50 MCG/ACT nasal spray Place 1 spray into both nostrils daily as needed for allergies or rhinitis.    [provider]  isosorbide mononitrate (IMDUR) 60 MG 24 hr tablet TAKE 1 TABLET EVERY DAY 08/25/19   Corky Crafts, MD  levothyroxine (SYNTHROID, LEVOTHROID) 50 MCG tablet Take 50 mcg by  mouth daily before breakfast.    [provider]  metoprolol tartrate (LOPRESSOR) 25 MG tablet Take 1 tablet (25 mg total) by mouth 2 (two) times daily. 08/25/19   Corky Crafts, MD  Multiple Vitamins-Minerals (MULTIVITAMIN WITH MINERALS) tablet Take 1 tablet by mouth daily.    [provider]  nitroGLYCERIN (NITROSTAT) 0.4 MG SL tablet DISSOLVE 1 TABLET UNDER THE TONGUE EVERY 5 MINUTES AS NEEDED FOR CHEST PAIN. PLEASE MAKE APPOINTMENT FOR DECEMBER 10/31/19   Corky Crafts, MD  pantoprazole (PROTONIX) 40 MG tablet TAKE 1 TABLET EVERY DAY 08/25/19   Corky Crafts, MD    PARoxetine (PAXIL) 10 MG tablet Take by mouth. 07/05/19   [provider]  Pirfenidone 801 MG TABS Take 1 tablet by mouth 3 (three) times daily.     [provider]  pravastatin (PRAVACHOL) 20 MG tablet Take 20 mg by mouth daily.    [provider]     Allergies    Patient has no known allergies.   Review of Systems   Review of Systems A comprehensive review of systems was completed and negative except as noted in HPI.    Physical Exam BP (!) 160/68   Pulse (!) 58   Temp 98.6 F (37 C) (Oral)   Resp (!) 23   SpO2 100%   Physical Exam Vitals and nursing note reviewed.  Constitutional:      Appearance: Normal appearance.  HENT:     Head: Normocephalic and atraumatic.     Nose: Nose normal.     Mouth/Throat:     Mouth: Mucous membranes are moist.  Eyes:     Extraocular Movements: Extraocular movements intact.     Conjunctiva/sclera: Conjunctivae normal.  Cardiovascular:     Rate and Rhythm: Normal rate.  Pulmonary:     Effort: Tachypnea present.     Breath sounds: Rales present.     Comments: Increased WOB, speaks in broken sentences Abdominal:     General: Abdomen is flat.     Palpations: Abdomen is soft.     Tenderness: There is no abdominal tenderness.  Musculoskeletal:        General: No swelling. Normal range of motion.     Cervical back: Neck supple.  Skin:    General: Skin is warm and dry.  Neurological:     General: No focal deficit present.     Mental Status: He is alert.  Psychiatric:        Mood and Affect: Mood normal.      ED Results / Procedures / Treatments   Labs (all labs ordered are listed, but only abnormal results are displayed) Labs Reviewed  BASIC METABOLIC PANEL - Abnormal; Notable for the following components:      Result Value   Glucose, Bld 149 (*)    All other components within normal limits  CBC - Abnormal; Notable for the following components:   RBC 3.75 (*)    Hemoglobin 11.8 (*)    HCT 36.7  (*)    All other components within normal limits  BRAIN NATRIURETIC PEPTIDE - Abnormal; Notable for the following components:   B Natriuretic Peptide 105.2 (*)    All other components within normal limits  SARS CORONAVIRUS 2 BY RT PCR (HOSPITAL ORDER, PERFORMED IN [facility] LAB)  TROPONIN I (HIGH SENSITIVITY)  TROPONIN I (HIGH SENSITIVITY)    EKG EKG Interpretation  Date/Time:  Thursday March 28 2020 16:03:05 EDT Ventricular Rate:  90 PR  Interval:  174 QRS Duration: 150 QT Interval:  402 QTC Calculation: 491 R Axis:   -90 Text Interpretation: Sinus rhythm with frequent Premature ventricular complexes Right bundle branch block Left anterior fascicular block. Bifascicular block. Left ventricular hypertrophy with repolarization abnormality ( R in aVL ) Abnormal ECG No significant change since last tracing Confirmed by Susy Frizzle [phone]) on 03/28/2020 7:18:54 PM   Radiology DG Chest 2 View  Result Date: 03/28/2020 CLINICAL DATA:  Shortness of breath, cough. EXAM: CHEST - 2 VIEW COMPARISON:  None. FINDINGS: Mild cardiomegaly is noted. No pneumothorax is noted. Diffuse reticular densities are noted throughout both lungs, most prominently seen in the lung bases, concerning for chronic interstitial lung disease, although superimposed acute inflammation or edema cannot be excluded. No significant effusion is noted. Bony thorax is unremarkable. IMPRESSION: Diffuse reticular densities are noted throughout both lungs, most prominently seen in the lung bases, concerning for chronic interstitial lung disease, although superimposed acute inflammation or edema cannot be excluded. Electronically Signed   By: Lupita Raider M.D.   On: 03/28/2020 16:50   CT Angio Chest PE W and/or Wo Contrast  Result Date: 03/28/2020 CLINICAL DATA:  PE suspected, high prob Shortness of breath. EXAM: CT ANGIOGRAPHY CHEST WITH CONTRAST TECHNIQUE: Multidetector CT imaging of the chest was performed using  the standard protocol during bolus administration of intravenous contrast. Multiplanar CT image reconstructions and MIPs were obtained to evaluate the vascular anatomy. CONTRAST:  28mL OMNIPAQUE IOHEXOL 350 MG/ML SOLN COMPARISON:  Radiograph earlier this day. No remote exams available for comparison. Report from high-resolution chest CT February 2017 at an outside institution, images not available. FINDINGS: Cardiovascular: There are no filling defects within the pulmonary arteries to suggest pulmonary embolus. Breathing motion and contrast bolus timing limits evaluation particularly at the lung bases. Aortic atherosclerosis and tortuosity. No dissection. Conventional branching pattern from the aortic arch. Borderline cardiomegaly. There are coronary artery calcifications. No pericardial effusion. Mediastinum/Nodes: Shotty mediastinal adenopathy with multiple nodes measuring up to 11 mm most prominently involving the upper mediastinum. There are small bilateral hilar nodes. Small calcified left hilar nodes. No nodes larger than 15 mm short axis. There is a small hiatal hernia. No thyroid nodule. Lungs/Pleura: Subpleural reticulation with multifocal bronchiectasis, detailed evaluation is obscured by motion. There is a slight basilar predominant distribution. Geographic ground-glass opacity in the right upper lobe perihilar right middle lobe. No dominant pulmonary mass or consolidative airspace disease. No pleural effusion. Upper Abdomen: Hepatic steatosis.  No acute findings. Musculoskeletal: Multilevel osteophytes and degenerative change in the spine. There are no acute or suspicious osseous abnormalities. Review of the MIP images confirms the above findings. IMPRESSION: 1. No pulmonary embolus. 2. There is background interstitial lung disease with subpleural reticulation and bronchiectasis, also reported on prior exam. Geographic ground-glass opacity in the right upper lobe and perihilar right middle lobe. This may  be related to interstitial lung disease or a separate process, either infectious or inflammatory. Pulmonary edema is felt less likely given the symmetry. 3. Shotty mediastinal and hilar adenopathy, likely reactive. 4. Coronary artery calcifications. 5. Incidental hepatic steatosis. Aortic Atherosclerosis (ICD10-I70.0). Electronically Signed   By: Narda Rutherford M.D.   On: 03/28/2020 19:29    Procedures .Critical Care Performed by: Pollyann Savoy, MD Authorized by: Pollyann Savoy, MD   Critical care provider statement:    Critical care time (minutes):  35   Critical care time was exclusive of:  Separately billable procedures and treating other patients  Critical care was necessary to treat or prevent imminent or life-threatening deterioration of the following conditions:  Respiratory failure   Critical care was time spent personally by me on the following activities:  Development of treatment plan with patient or surrogate, discussions with consultants, evaluation of patient's response to treatment, examination of patient, ordering and performing treatments and interventions, ordering and review of laboratory studies, ordering and review of radiographic studies, pulse oximetry, re-evaluation of patient's condition and review of old charts    Medications Ordered in the ED Medications  levofloxacin (LEVAQUIN) IVPB 750 mg (has no administration in time range)  iohexol (OMNIPAQUE) 350 MG/ML injection 75 mL (75 mLs Intravenous Contrast Given 03/28/20 1910)  methylPREDNISolone sodium succinate (SOLU-MEDROL) 125 mg/2 mL injection 125 mg (125 mg Intravenous Given 03/28/20 1940)  furosemide (LASIX) injection 40 mg (40 mg Intravenous Given 03/28/20 1940)     MDM Rules/Calculators/A&P MDM Pulmonology clinic notes reviewed. Will add CTA, solumedrol and plan admission to the hospital. CBC and BMP unremarkable. CXR consistent with ILD. Trop and Covid are neg.  ED Course  I have reviewed the  triage vital signs and the nursing notes.  Pertinent labs & imaging results that were available during my care of the patient were reviewed by me and considered in my medical decision making (see chart for details).  Clinical Course as of Mar 28 1954  Thu Mar 28, 2020  1935 CTA neg for PE. Will page for admission.    [CS]  1952 Spoke with Dr. Martyn MalayShaloub, Hospitalist, who will evaluate for admission. Agrees with plan for Levaquin.    [CS]    Clinical Course User Index [CS] Pollyann SavoySheldon, Zamari Vea B, MD    Final Clinical Impression(s) / ED Diagnoses Final diagnoses:  Interstitial lung disease (HCC)  Acute on chronic respiratory failure with hypoxia [facility](HCC)    Rx / DC Orders ED Discharge Orders    None       Pollyann SavoySheldon, Silvio Sausedo B, MD 03/28/20 [phone]

## 2020-03-28 NOTE — ED Triage Notes (Signed)
Pt was seen by his pulmonologist today and failed his walking test and was told to come here.  Pt has had singnificant increase of sob for a week.  Pt is on 8L [place].

## 2020-03-29 ENCOUNTER — Inpatient Hospital Stay (HOSPITAL_COMMUNITY): Payer: Medicare Other

## 2020-03-29 DIAGNOSIS — Z515 Encounter for palliative care: Secondary | ICD-10-CM

## 2020-03-29 DIAGNOSIS — I5021 Acute systolic (congestive) heart failure: Secondary | ICD-10-CM

## 2020-03-29 DIAGNOSIS — Z789 Other specified health status: Secondary | ICD-10-CM

## 2020-03-29 DIAGNOSIS — R0602 Shortness of breath: Secondary | ICD-10-CM

## 2020-03-29 DIAGNOSIS — Z7189 Other specified counseling: Secondary | ICD-10-CM

## 2020-03-29 DIAGNOSIS — Z66 Do not resuscitate: Secondary | ICD-10-CM

## 2020-03-29 LAB — COMPREHENSIVE METABOLIC PANEL
ALT: 43 U/L (ref 0–44)
AST: 37 U/L (ref 15–41)
Albumin: 3.8 g/dL (ref 3.5–5.0)
Alkaline Phosphatase: 39 U/L (ref 38–126)
Anion gap: 12 (ref 5–15)
BUN: 19 mg/dL (ref 8–23)
CO2: 27 mmol/L (ref 22–32)
Calcium: 9.5 mg/dL (ref 8.9–10.3)
Chloride: 99 mmol/L (ref 98–111)
Creatinine, Ser: 1.22 mg/dL (ref 0.61–1.24)
GFR calc Af Amer: >60 mL/min (ref >60)
GFR calc non Af Amer: 58 mL/min — ABNORMAL LOW (ref >60)
Glucose, Bld: 222 mg/dL — ABNORMAL HIGH (ref 70–99)
Potassium: 3.9 mmol/L (ref 3.5–5.1)
Sodium: 138 mmol/L (ref 135–145)
Total Bilirubin: 0.7 mg/dL (ref 0.3–1.2)
Total Protein: 7.7 g/dL (ref 6.5–8.1)

## 2020-03-29 LAB — CBC WITH DIFFERENTIAL/PLATELET
Abs Immature Granulocytes: 0.04 10*3/uL (ref 0.00–0.07)
Basophils Absolute: 0 10*3/uL (ref 0.0–0.1)
Basophils Relative: 0 %
Eosinophils Absolute: 0 10*3/uL (ref 0.0–0.5)
Eosinophils Relative: 0 %
HCT: 37.4 % — ABNORMAL LOW (ref 39.0–52.0)
Hemoglobin: 12.1 g/dL — ABNORMAL LOW (ref 13.0–17.0)
Immature Granulocytes: 0 %
Lymphocytes Relative: 10 %
Lymphs Abs: 0.9 10*3/uL (ref 0.7–4.0)
MCH: 31.6 pg (ref 26.0–34.0)
MCHC: 32.4 g/dL (ref 30.0–36.0)
MCV: 97.7 fL (ref 80.0–100.0)
Monocytes Absolute: 0.3 10*3/uL (ref 0.1–1.0)
Monocytes Relative: 3 %
Neutro Abs: 7.7 10*3/uL (ref 1.7–7.7)
Neutrophils Relative %: 87 %
Platelets: 296 10*3/uL (ref 150–400)
RBC: 3.83 MIL/uL — ABNORMAL LOW (ref 4.22–5.81)
RDW: 13.8 % (ref 11.5–15.5)
WBC: 8.9 10*3/uL (ref 4.0–10.5)
nRBC: 0 % (ref 0.0–0.2)

## 2020-03-29 LAB — ECHOCARDIOGRAM COMPLETE: S' Lateral: 3.5 cm

## 2020-03-29 LAB — MAGNESIUM: Magnesium: 1.8 mg/dL (ref 1.7–2.4)

## 2020-03-29 MED ORDER — FUROSEMIDE 10 MG/ML IJ SOLN
20.0000 mg | Freq: Three times a day (TID) | INTRAMUSCULAR | Status: AC
  Administered 2020-03-29 – 2020-03-31 (×6): 20 mg via INTRAVENOUS
  Filled 2020-03-29 (×6): qty 2

## 2020-03-29 MED ORDER — PERFLUTREN LIPID MICROSPHERE
1.0000 mL | INTRAVENOUS | Status: AC | PRN
  Administered 2020-03-29: 3 mL via INTRAVENOUS
  Filled 2020-03-29: qty 10

## 2020-03-29 MED ORDER — PIRFENIDONE 801 MG PO TABS
1.0000 | ORAL_TABLET | Freq: Three times a day (TID) | ORAL | Status: DC
  Administered 2020-03-29 – 2020-04-02 (×11): 1 via ORAL
  Filled 2020-03-29 (×15): qty 1

## 2020-03-29 NOTE — Consult Note (Addendum)
NAME:  Douglas Lopez, MRN:  [id number], DOB:  Oct 27, 1945, LOS: 1 ADMISSION DATE:  03/28/2020, CONSULTATION DATE:  8/27 REFERRING MD:  Dr Margo Aye, CHIEF COMPLAINT:  Hypoxia with IPF history   Brief History   74 year old male with well documented IPF history who has had increasing O2 demands over the past few months up to 15 LPM. He was directed to the ED by his pulmonologist in [place] for further workup.   History of present illness   74 year old male with PMH as below, which is significant for CAD, HTN, Hypothyroidism, OSA on CPAP, and idiopathic pulmonary fibrosis. He is vaccinated for COVID-19 infection. He has had extensive workup for this by Dr. Ileene Hutchinson in the [facility] clinic. He became symptomatic in 2012 and was initially treated for bronchitis and pnuemonia many times, but ultimately had CT scan in 2017 at which point he was diagnosed. He has not undergone lung biopsy and was instead started on pirfenidone in July of 2017. He is also managed on prednisone 10mg  daily.  He has noted a fairly sharp decline over the past 6 months slowly requiring increases in his baseline oxygen from 8L up to 15 L with activity and was still desaturating to the high 60s at times. He presented to pulmonary clinic on 8/26 with these complaints where he failed 6 minute walk test after 4 mins and desaturated to 83%. He was referred to the hospital with recommendations for high dose steroids, antibiotics, and diuresis. He is DNR. Pulmonologist feels his prognosis is days to months.   Past Medical History   has a past medical history of Arthritis, Bleeding stomach ulcer (1984), Chronic bronchitis (HCC), Coronary artery disease, High cholesterol, History of blood transfusion (1984), Hypertension, Hypothyroidism, Idiopathic pulmonary fibrosis (HCC), OSA on CPAP, Pneumonia, and Right bundle branch block.  Significant Hospital Events   8/27 admit  Consults:  PCCM Palliative  Procedures:    Significant  Diagnostic Tests:    Micro Data:    Antimicrobials:  Levofloxacin 8/27 >   Interim history/subjective:  Seen lying in bed, states he feels better but continues to experience significant dyspnea with any movement .   Objective   Blood pressure 125/74, pulse 81, temperature 97.9 F (36.6 C), temperature source Oral, resp. rate (!) 26, SpO2 95 %.        Intake/Output Summary (Last 24 hours) at 03/29/2020 1739 Last data filed at 03/29/2020 1200 Gross per 24 hour  Intake --  Output 3950 ml  Net -3950 ml   There were no vitals filed for this visit.  Examination: General: Chronically ill appearing elderly male lying in bed in NAD HEENT: [place]/AT, MM pink/moist, PERRL,  Neuro: Alert and oriented x3, non-focal CV: s1s2 regular rate and rhythm, no murmur, rubs, or gallops,  PULM:  Diminished breath sounds, non productive cough, ooxygen saturations 91-95 on 15 HFNC GI: soft, bowel sounds active in all 4 quadrants, non-tender, non-distended Extremities: warm/dry, no edema  Skin: no rashes or lesions  Resolved Hospital Problem list     Assessment & Plan:   Acute on chronic hypoxemic respiratory failure:  -Baseline IPF on home O2, prednisone 10, and pirfenidone. Suspect this is flare/progression of his IPF, but it is worth ruling out other causes that are more treatable if able.  -CTA negative for PE on admission P: Supportive care with supplemental O2  Diurese as tolerated CAP antibiotics empirically Continue systemic steroids Pallaitive care consulted. The patient is aware his prognosis is poor  and he accepts this.    Labs   CBC: Recent Labs  Lab 03/28/20 1644 03/28/20 2202 03/29/20 0332  WBC 9.0  --  8.9  NEUTROABS  --   --  7.7  HGB 11.8* 13.3 12.1*  HCT 36.7* 39.0 37.4*  MCV 97.9  --  97.7  PLT 287  --  296    Basic Metabolic Panel: Recent Labs  Lab 03/28/20 1644 03/28/20 2202 03/29/20 0332  NA 141 140 138  K 3.9 3.7 3.9  CL 104  --  99  CO2 25  --  27   GLUCOSE 149*  --  222*  BUN 15  --  19  CREATININE 1.05  --  1.22  CALCIUM 9.5  --  9.5  MG  --   --  1.8   GFR: CrCl cannot be calculated (Unknown ideal weight.). Recent Labs  Lab 03/28/20 1644 03/29/20 0332  WBC 9.0 8.9    Liver Function Tests: Recent Labs  Lab 03/29/20 0332  AST 37  ALT 43  ALKPHOS 39  BILITOT 0.7  PROT 7.7  ALBUMIN 3.8   No results for input(s): LIPASE, AMYLASE in the last 168 hours. No results for input(s): AMMONIA in the last 168 hours.  ABG    Component Value Date/Time   PHART 7.462 (H) 03/28/2020 2202   PCO2ART 45.3 03/28/2020 2202   PO2ART 110 (H) 03/28/2020 2202   HCO3 32.4 (H) 03/28/2020 2202   TCO2 34 (H) 03/28/2020 2202   O2SAT 99.0 03/28/2020 2202     Coagulation Profile: No results for input(s): INR, PROTIME in the last 168 hours.  Cardiac Enzymes: No results for input(s): CKTOTAL, CKMB, CKMBINDEX, TROPONINI in the last 168 hours.  HbA1C: No results found for: HGBA1C  CBG: No results for input(s): GLUCAP in the last 168 hours.  Review of Systems:   Bolds are positive  Constitutional: weight loss, gain, night sweats, Fevers, chills, fatigue .  HEENT: headaches, Sore throat, sneezing, nasal congestion, post nasal drip, Difficulty swallowing, Tooth/dental problems, visual complaints visual changes, ear ache CV:  chest pain, radiates:,Orthopnea, PND, swelling in lower extremities, dizziness, palpitations, syncope.  GI  heartburn, indigestion, abdominal pain, nausea, vomiting, diarrhea, change in bowel habits, loss of appetite, bloody stools.  Resp: cough, productive: , hemoptysis, dyspnea, chest pain, pleuritic.  Skin: rash or itching or icterus GU: dysuria, change in color of urine, urgency or frequency. flank pain, hematuria  MS: joint pain or swelling. decreased range of motion  Psych: change in mood or affect. depression or anxiety.  Neuro: difficulty with speech, weakness, numbness, ataxia   Past Medical History  He,   has a past medical history of Arthritis, Bleeding stomach ulcer (1984), Chronic bronchitis (HCC), Coronary artery disease, High cholesterol, History of blood transfusion (1984), Hypertension, Hypothyroidism, Idiopathic pulmonary fibrosis (HCC), OSA on CPAP, Pneumonia, and Right bundle branch block.   Surgical History    Past Surgical History:  Procedure Laterality Date  . CATARACT EXTRACTION W/ INTRAOCULAR LENS  IMPLANT, BILATERAL Bilateral 1994  . CORONARY ANGIOPLASTY WITH STENT PLACEMENT  11/05/2016  . CORONARY STENT INTERVENTION N/A 11/05/2016   Procedure: Coronary Stent Intervention;  Surgeon: Corky Crafts, MD;  Location: [facility] INVASIVE CV LAB;  Service: Cardiovascular;  Laterality: N/A;  . CYSTOSCOPY W/ STONE MANIPULATION  1994  . INGUINAL HERNIA REPAIR Left 1972  . KNEE ARTHROSCOPY Bilateral 1994  . RIGHT/LEFT HEART CATH AND CORONARY ANGIOGRAPHY N/A 10/26/2016   Procedure: Right/Left Heart Cath and Coronary Angiography;  Surgeon: Corky Crafts, MD;  Location: [facility] INVASIVE CV LAB;  Service: Cardiovascular;  Laterality: N/A;  . TONSILLECTOMY  1950s     Social History   reports that he has never smoked. He has never used smokeless tobacco. He reports that he does not drink alcohol and does not use drugs.   Family History   His family history includes Heart disease in his father and mother.   Allergies No Known Allergies   Home Medications  Prior to Admission medications   Medication Sig Start Date End Date Taking? Authorizing Provider  amLODipine (NORVASC) 5 MG tablet Take 5 mg by mouth in the morning.  04/13/16  Yes [provider]  aspirin 81 MG tablet Take 81 mg by mouth in the morning.    Yes [provider]  clopidogrel (PLAVIX) 75 MG tablet Take 1 tablet (75 mg total) by mouth daily. Patient taking differently: Take 75 mg by mouth in the morning.  08/25/19  Yes Corky Crafts, MD  fluticasone [facility]) 50 MCG/ACT nasal spray Place 2 sprays into  both nostrils daily as needed for allergies or rhinitis.    Yes [provider]  isosorbide mononitrate (IMDUR) 60 MG 24 hr tablet TAKE 1 TABLET EVERY DAY Patient taking differently: Take 60 mg by mouth in the morning.  08/25/19  Yes Corky Crafts, MD  levothyroxine (SYNTHROID, LEVOTHROID) 50 MCG tablet Take 50 mcg by mouth daily before breakfast.   Yes [provider]  metoprolol tartrate (LOPRESSOR) 25 MG tablet Take 1 tablet (25 mg total) by mouth 2 (two) times daily. 08/25/19  Yes Corky Crafts, MD  Multiple Vitamins-Minerals (MULTIVITAMIN WITH MINERALS) tablet Take 1 tablet by mouth daily.   Yes [provider]  nitroGLYCERIN (NITROSTAT) 0.4 MG SL tablet DISSOLVE 1 TABLET UNDER THE TONGUE EVERY 5 MINUTES AS NEEDED FOR CHEST PAIN. PLEASE MAKE APPOINTMENT FOR DECEMBER Patient taking differently: Place 0.4 mg under the tongue every 5 (five) hours as needed for chest pain.  10/31/19  Yes Corky Crafts, MD  OXYGEN Inhale 10 L/min into the lungs continuous.   Yes [provider]  pantoprazole (PROTONIX) 40 MG tablet TAKE 1 TABLET EVERY DAY Patient taking differently: Take 40 mg by mouth daily before breakfast.  08/25/19  Yes Corky Crafts, MD  Pirfenidone 801 MG TABS Take 801 mg by mouth with breakfast, with lunch, and with evening meal.    Yes [provider]  pravastatin (PRAVACHOL) 20 MG tablet Take 20 mg by mouth daily.   Yes [provider]  PARoxetine (PAXIL) 10 MG tablet Take by mouth. Patient not taking: Reported on 03/28/2020 07/05/19   [provider]     Signature  Delfin Gant, NP-C [facility] Contact / Pager information can be found on Amion  03/30/2020, 2:58 PM

## 2020-03-29 NOTE — Consult Note (Signed)
Consultation Note Date: 03/29/2020   Patient Name: Douglas Lopez  DOB: 1946/02/26  MRN: [id number]  Age / Sex: 74 y.o., male  PCP: Katherina Mires, MD Referring Physician: Kayleen Memos, DO  Reason for Consultation: Establishing goals of care  HPI/Patient Profile: 74 y.o. male  with past medical history of obstructive sleep apnea, idiopathic pulmonary fibrosis with requires chronic O2 use, GERD, hypothyroidism, coronary artery disease s/p cath with stent x3 in 2018, and hypertension admitted on 03/28/2020 with acute exacerbation of idiopathic pulmonary fibrosis and acute on chronic respiratory failure.  Patient faces treatment option decisions, advanced directive decisions, and anticipatory care needs.   Clinical Assessment and Goals of Care: I have reviewed medical records including EPIC notes, labs, and imaging. Received report from primary RN - RN had no acute concerns.  Went to visit patient at bedside - no family present. Patient was awake, alert, oriented, and able to participate in conversation. He was on 15L HFNC. No signs or non-verbal gestures of pain or discomfort noted. No respiratory distress, increased work of breathing, or secretions noted. Patient stated while he was lying still he was not short of breath, but did become easily short of breath on exertion.   Met with patient  to discuss diagnosis, prognosis, GOC, EOL wishes, disposition, and options.  I introduced Palliative Medicine as specialized medical care for people living with serious illness. It focuses on providing relief from the symptoms and stress of a serious illness. The goal is to improve quality of life for both the patient and the family.  We discussed a brief life review of the patient. Douglas Lopez has been married for 30 years and they have 1 son together, whom he is close to. 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 He is originally from [place], lived in [place] for 21 years, and then moved to [place] to be closer to his son. The patient enjoys working with people, doing activities with his wife, reading, and researching his genealogy.   As far as functional and nutritional status, the patient prior to hospitalization lived with his wife in a private home. He was independent with all ADLs and was able to ambulate around his home independently. Over the last several months, the patient has required increased O2 to maintain his saturations above 90% at home. Over the last 2 weeks, the patient has noticed a marked decline in his respiratory status, requiring significant increased oxygen requirement with increased shortness of breath. He has found it increasingly hard to ambulate short distances even within his house. Two weeks ago, he was able to go out to lunch with his wife, now he is unable to tolerate that activity. He is followed by a pulmonologist at [facility], who recommended he be evaluated inpatient due to concern for possible worsening of his pulmonary fibrosis or another cause, such as PE or viral infection.  The patient stated he was first diagnosed with pulmonary  fibrosis in March 2019. His pulmonologist at [facility] told him he was not a good candidate for transplant and have been managing him medically.   We discussed patient's current illness and what it means in the larger context of patient's on-going co-morbidities.  The patient had a very clear understanding of his current medical situation and limitations. Natural disease trajectory for pulmonary fibrosis and expectations at EOL were discussed. I attempted to elicit values and goals of care important to the patient. The difference between aggressive medical intervention and comfort care was considered in light of the patient's goals of care. The patient understands that if this exacerbation is  progression of his PF, he is most likely at end stages. I introduced the concept of a comfort path to patient and encouraged him to think about at what point he would want to stop aggressive medical interventions and focus on helping him feel as best he can for as long as he can outside the hospital, if that were his goal. Douglas Lopez stated that his goal is to go back home and spend as much time as he can with his family - he does not wish to be rehospitalized.  Hospice philosophy/services were explained and discussed in detail. The patient was agreeable to discharge home with hospice support to meet his goal of being able to stay home and manage his symptoms without rehospitalization in the future. The patient stated that "the lord will see me through." He states he has a strong support system that includes his family, church, and pulmonary support group.  Patient stated that since he was already in house and being seen, he wanted to continue work up to rule out any other underlying cause of increased shortness of breath.   Advance directives, concepts specific to code status, artificial feeding and hydration, and rehospitalization were considered and discussed. Douglas Lopez stated that he has already completed a Living Will and HCPOA - asked if his family could bring copies if they visit with him, so we can add them to our records. He has designated his wife as his HCPOA. He has also previously completed a MOST and DNR form, which he brought with him - copies were obtained.   Discussed with patient the importance of continued conversation with family and the medical providers regarding overall plan of care and treatment options, ensuring decisions are within the context of the patient's values and GOCs.    Questions and concerns were addressed. The family was encouraged to call with questions or concerns. PMT card was provided.  Primary Decision Maker: PATIENT    SUMMARY OF RECOMMENDATIONS   -Treat  the treatable, full scope -Continue DNR/DNI as previously documented -Patient wants to continue work up in house to rule out any other underlying causes of his increased shortness of breath -He wants to continue treatments to try and feel as best as he can before discharge -He wants to discharge with home hospice support and his goal is to not be rehospitalized. He prefers hospice in [facility], which would be [place].  -TOC consult placed for home hospice. TOC and hospice liaison notified. -Chaplain consult placed per patient request for emotional support and prayer; he is of [place] copies of Living Will and HCPOA. Patient states his wife is primary HCPOA. Please place copies on chart if they become available -Obtained copies of his previously completed MOST and DNR form, which will be scanned into [employer]. -Reviewed MOST form with patient to  ensure it was still in alignment with his wishes. -PMT will continue to follow holistically  Code Status/Advance Care Planning:  DNR  Palliative Prophylaxis:   Frequent Pain Assessment  Additional Recommendations (Limitations, Scope, Preferences):  Full Scope Treatment  Psycho-social/Spiritual:   Desire for further Chaplaincy support:yes  Created space and opportunity for patient to express thoughts and feelings regarding patient's current medical situation.   Emotional support provided  Prognosis:   < 6 months  Discharge Planning: Home with Hospice      Primary Diagnoses: Present on Admission: . Acute on chronic respiratory failure with hypoxia ([place]) . Acute exacerbation of idiopathic pulmonary fibrosis ([place]) . GERD without esophagitis . Essential hypertension . Hypothyroidism   I have reviewed the medical record, interviewed the patient and family, and examined the patient. The following aspects are pertinent.  Past Medical History:  Diagnosis Date  . Arthritis    "joints" (11/05/2016)  .  Bleeding stomach ulcer 1984  . Chronic bronchitis ([place])   . Coronary artery disease    11/05/16 Cath with PCI and DES x1, Dist Crx, mLAD, dLAD  . High cholesterol   . History of blood transfusion 1984   "took 5 units for bleeding stomach ulcer"  . Hypertension   . Hypothyroidism   . Idiopathic pulmonary fibrosis ([place])    dx'd 10/2015  . OSA on CPAP   . Pneumonia    "couple times in the last 5 years" (11/05/2016)  . Right bundle branch block    Social History   Socioeconomic History  . Marital status: Married    Spouse name: Not on file  . Number of children: Not on file  . Years of education: Not on file  . Highest education level: Not on file  Occupational History  . Not on file  Tobacco Use  . Smoking status: Never Smoker  . Smokeless tobacco: Never Used  Vaping Use  . Vaping Use: Never used  Substance and Sexual Activity  . Alcohol use: No  . Drug use: No  . Sexual activity: Yes  Other Topics Concern  . Not on file  Social History Narrative  . Not on file   Social Determinants of Health   Financial Resource Strain:   . Difficulty of Paying Living Expenses: Not on file  Food Insecurity:   . Worried About Charity fundraiser in the Last Year: Not on file  . Ran Out of Food in the Last Year: Not on file  Transportation Needs:   . Lack of Transportation (Medical): Not on file  . Lack of Transportation (Non-Medical): Not on file  Physical Activity:   . Days of Exercise per Week: Not on file  . Minutes of Exercise per Session: Not on file  Stress:   . Feeling of Stress : Not on file  Social Connections:   . Frequency of Communication with Friends and Family: Not on file  . Frequency of Social Gatherings with Friends and Family: Not on file  . Attends Religious Services: Not on file  . Active Member of Clubs or Organizations: Not on file  . Attends Archivist Meetings: Not on file  . Marital Status: Not on file   Family History  Problem Relation Age of  Onset  . Heart disease Mother   . Heart disease Father    Scheduled Meds: . amLODipine  5 mg Oral Daily  . aspirin EC  81 mg Oral Daily  . clopidogrel  75 mg Oral Daily  .  enoxaparin (LOVENOX) injection  40 mg Subcutaneous QHS  . isosorbide mononitrate  60 mg Oral Daily  . levothyroxine  50 mcg Oral Q0600  . methylPREDNISolone (SOLU-MEDROL) injection  60 mg Intravenous Q8H  . metoprolol tartrate  25 mg Oral BID  . multivitamin with minerals  1 tablet Oral Daily  . pantoprazole  40 mg Oral Daily  . Pirfenidone  1 tablet Oral TID  . pravastatin  20 mg Oral Daily   Continuous Infusions: . levofloxacin (LEVAQUIN) IV     PRN Meds:.acetaminophen **OR** acetaminophen, fluticasone, guaiFENesin-codeine, HYDROcodone-acetaminophen, ipratropium-albuterol, nitroGLYCERIN, ondansetron **OR** ondansetron (ZOFRAN) IV, polyethylene glycol Medications Prior to Admission:  Prior to Admission medications   Medication Sig Start Date End Date Taking? Authorizing Provider  amLODipine (NORVASC) 5 MG tablet Take 5 mg by mouth in the morning.  04/13/16  Yes [provider]  aspirin 81 MG tablet Take 81 mg by mouth in the morning.    Yes [provider]  clopidogrel (PLAVIX) 75 MG tablet Take 1 tablet (75 mg total) by mouth daily. Patient taking differently: Take 75 mg by mouth in the morning.  08/25/19  Yes Jettie Booze, MD  fluticasone [facility]) 50 MCG/ACT nasal spray Place 2 sprays into both nostrils daily as needed for allergies or rhinitis.    Yes [provider]  isosorbide mononitrate (IMDUR) 60 MG 24 hr tablet TAKE 1 TABLET EVERY DAY Patient taking differently: Take 60 mg by mouth in the morning.  08/25/19  Yes Jettie Booze, MD  levothyroxine (SYNTHROID, LEVOTHROID) 50 MCG tablet Take 50 mcg by mouth daily before breakfast.   Yes [provider]  metoprolol tartrate (LOPRESSOR) 25 MG tablet Take 1 tablet (25 mg total) by mouth 2 (two) times daily. 08/25/19   Yes Jettie Booze, MD  Multiple Vitamins-Minerals (MULTIVITAMIN WITH MINERALS) tablet Take 1 tablet by mouth daily.   Yes [provider]  nitroGLYCERIN (NITROSTAT) 0.4 MG SL tablet DISSOLVE 1 TABLET UNDER THE TONGUE EVERY 5 MINUTES AS NEEDED FOR CHEST PAIN. PLEASE MAKE APPOINTMENT FOR DECEMBER Patient taking differently: Place 0.4 mg under the tongue every 5 (five) hours as needed for chest pain.  10/31/19  Yes Jettie Booze, MD  OXYGEN Inhale 10 L/min into the lungs continuous.   Yes [provider]  pantoprazole (PROTONIX) 40 MG tablet TAKE 1 TABLET EVERY DAY Patient taking differently: Take 40 mg by mouth daily before breakfast.  08/25/19  Yes Jettie Booze, MD  Pirfenidone 801 MG TABS Take 801 mg by mouth with breakfast, with lunch, and with evening meal.    Yes [provider]  pravastatin (PRAVACHOL) 20 MG tablet Take 20 mg by mouth daily.   Yes [provider]  PARoxetine (PAXIL) 10 MG tablet Take by mouth. Patient not taking: Reported on 03/28/2020 07/05/19   [provider]   No Known Allergies Review of Systems  Respiratory: Positive for cough and shortness of breath.   All other systems reviewed and are negative.   Physical Exam Vitals and nursing note reviewed.  Constitutional:      General: He is not in acute distress. Pulmonary:     Effort: No respiratory distress.     Comments: Cough, short of breath with oxygen desaturation on exertion   Skin:    General: Skin is warm and dry.  Neurological:     Mental Status: He is alert and oriented to person, place, and time.     Motor: Weakness present.  Psychiatric:  Behavior: Behavior is cooperative.        Cognition and Memory: Cognition normal.     Vital Signs: BP 135/86   Pulse 88   Temp 98.6 F (37 C) (Oral)   Resp (!) 29   SpO2 96%  Pain Scale: 0-10   Pain Score: 9    SpO2: SpO2: 96 % O2 Device:SpO2: 96 % O2 Flow Rate: .O2 Flow Rate  (L/min): 15 L/min  IO: Intake/output summary:   Intake/Output Summary (Last 24 hours) at 03/29/2020 1440 Last data filed at 03/29/2020 1200 Gross per 24 hour  Intake --  Output 3950 ml  Net -3950 ml    LBM:   Baseline Weight:   Most recent weight:       Palliative Assessment/Data: PPS 40%     Time In: 1345 Time Out: 1455 Time Total: 70 minutes  Greater than 50%  of this time was spent counseling and coordinating care related to the above assessment and plan.  Signed by: Lin Landsman, NP   Please contact Palliative Medicine Team phone at [phone] for questions and concerns.  For individual provider: See Shea Evans

## 2020-03-29 NOTE — Progress Notes (Signed)
  Echocardiogram 2D Echocardiogram has been performed.  [name] 03/29/2020, 11:55 AM

## 2020-03-29 NOTE — Progress Notes (Signed)
External urinary catheter was not noted/document in the patient's chart. The patient already had it on when I came onto shift, I documented/added the external urinary catheter into the patient's I/O flowsheet as well as the LDA avatar. I do not know how applied it, so I left the "person inserting catheter" documentation blank but I am the one who added it into the patient's chart.   Loleta Chance, NT

## 2020-03-29 NOTE — Plan of Care (Signed)
POC initiated and progressing. 

## 2020-03-29 NOTE — ED Notes (Signed)
Pt showed to 77% on hi-flo [place]. Pt was using HI-Flow [place] from home. Notified RT who changed over to [facility] HI-Flow  and huminidified bottle. Pt stated he could tell the difference and is at

## 2020-03-29 NOTE — Progress Notes (Addendum)
Pt admitted from ED.  Pt A&O X 4 and neuro intact.  Vitals taken and all within normal range.  Patient on High Flow [place] at 15L, O2 sat. At 95% at rest.  Pt desaturates rapidly within minimal movement.  Pt placed on telemetry and CCMD notified. CHG bath completed.  Pt currently comfortable and denies any pain.

## 2020-03-29 NOTE — Progress Notes (Signed)
PROGRESS NOTE  Celedonio MiyamotoDennis Stallone UJW:[id number] DOB: 1946-05-25 DOA: 03/28/2020 PCP: [name], Kim K, MD  HPI/Recap of past 8524 hours: 74 year old male with past medical history of idiopathic pulmonary fibrosis, hypertension, gastroesophageal reflux disease, hyperlipidemia, coronary artery disease status post cath with stent x 3 (11/2016), chronic respiratory failure (currently on 9-10LPM at rest at home), hypothyroidism who presents to [facility] emergency department from his pulmonologist office due to progressively worsening shortness of breath.  Patient explains that for the past 3 months, his shortness of breath and oxygen requirements have gradually increased.  Patient was using 4 L of oxygen via nasal cannula in May, followed by 8 L in June.  Patient is now using 9 L of oxygen via nasal cannula as of early August.  Patient explains that for approximately the past 4 days he has had a rapid acceleration in the degree of shortness of breath he is experiencing.  Shortness of breath is constant severe in intensity, worse with minimal exertion with only some improvement with rest.  Patient denies any associated chest pain.  Patient does complain of severe, typically nonproductive cough that has worsened over the span of time.  Patient is complaining of associated left flank and abdominal pain occurring with deep inspiration and cough.  Patient denies fevers, sick contacts, recent travel or confirmed contact with COVID-19 infection.  As patient symptoms worsened in the following days, patient noticed occasional bouts of severe hypoxia, as low as 60% with exertion.  Of note, patient is status post Moderna COVID-19 vaccination in February and March 2021.  Patient initially presented to see his pulmonologist Dr. [name] on 03/28/20.  Due to observed respiratory distress and hypoxia in the pulmonology clinic the patient was sent to [facility] emergency department for evaluation and  administration of high-dose steroids, antibiotic therapy and diuretics.  Upon evaluation in the emergency department patient was found to be in respiratory distress.  CT imaging of the chest was performed revealing mediastinal hilar adenopathy with bilateral groundglass opacities, particularly in the right upper lobe and right middle lobe.  Patient was placed on intravenous levofloxacin, intravenous Solu-Medrol and intravenous Lasix.  The hospitalist group was then called to assess the patient for admission to the hospital.  03/29/20:  Seen in the ED on 15L HFNC with O2 sat 97%.  Reports Dyspnea with minimal movement.  Assessment/Plan: Principal Problem:   Acute exacerbation of idiopathic pulmonary fibrosis (HCC) Active Problems:   Mixed hyperlipidemia   Coronary artery disease of native artery of native heart with stable angina pectoris (HCC)   Acute on chronic respiratory failure with hypoxia (HCC)   GERD without esophagitis   Essential hypertension   Hypothyroidism   Acute exacerbation of idiopathic pulmonary fibrosis (HCC)  Patient presenting with several month history of progressive shortness of breath and dyspnea on exertion, symptoms have particularly worsened over the past few days.  CT imaging is seemingly consistent with progressive pulmonary fibrosis although a superimposed atypical infectious process or pulmonary edema is possible.  In review of Dr. [name]'s pulmonology note from 8/26, he recommends initiation of a combination of high-dose steroid therapy, intravenous diuretics and antibiotic therapy.  Placing patient on Solu-Medrol 60 mg every 8, a trial of intravenous Lasix, as well as intravenous Levaquin.  Continue as needed bronchodilator therapy  Continue as needed antitussive therapy  Continue supplemental oxygen necessary to achieve oxygen saturations of between 88 and 92%.  Patient is typically on CPAP nightly, 16 cmH2O -we will admit to progressive  care unit in  case noninvasive positive pressure ventilation needs to be used more often.  2D echo done 8/27 showing LVEF 50 to 55% with grade 1 diastolic dysfunction  Will obtain pulmonary consult.    Acute on chronic respiratory failure with Hypoxia (HCC)  Please see assessment and plan above    Coronary artery disease of native artery of native heart with stable angina pectoris (HCC)   Denies any chest pain Continue to closely monitor on telemetry Continue beta-blocker, statin and antiplatelet.    Essential hypertension  BP stable Continue home regimen amlodipine, metoprolol Continue to monitor vital signs    Hypothyroidism   Continue home regimen of Synthroid    Mixed hyperlipidemia   Continue home regimen of statin therapy    GERD without esophagitis   Continue home regimen of PPI  Goals of Care   Patient is suffering from advanced sequela of idiopathic pulmonary fibrosis, Prognosis is rather poor  If patient does not respond to current plan of care with steroids, diuretics and antibiotics, hospice may be an appropriate option  I have discussed this at length with the patient, he is open to the possibility of hospice if his symptoms do not rapidly improve.  We will order palliative care consultation.  Patient is currently DNR/DNI  Palliative care team following.   Status is: Inpatient    Dispo: The patient is from:Home               Anticipated d/c is ZO:XWRU              Anticipated d/c date is:03/22/20              Patient currently not clinically stable for dc due to ongoing management of acute exacerbation of severe pulmonary fibrosis.        Objective: Vitals:   03/29/20 1100 03/29/20 1200 03/29/20 1300 03/29/20 1534  BP: (!) 148/89 (!) 181/107 135/86 134/78  Pulse: 82 85 88 82  Resp: (!) 24 (!) 26 (!) 29 (!) 26  Temp:    98.3 F (36.8 C)  TempSrc:    Oral  SpO2: 96% 96% 96% 95%    Intake/Output Summary (Last 24 hours) at 03/29/2020  1555 Last data filed at 03/29/2020 1200 Gross per 24 hour  Intake --  Output 3950 ml  Net -3950 ml   There were no vitals filed for this visit.  Exam:  . General: 74 y.o. year-old male well developed well nourished in no acute distress.  Alert and oriented x3. . Cardiovascular: Regular rate and rhythm with no rubs or gallops.  No thyromegaly or JVD noted.   Marland Kitchen Respiratory: Diffused rales bilaterally . Abdomen: Soft nontender nondistended with normal bowel sounds x4 quadrants. . Musculoskeletal: Trace lower extremity edema bilaterally. Marland Kitchen Psychiatry: Mood is appropriate for condition and setting   Data Reviewed: CBC: Recent Labs  Lab 03/28/20 1644 03/28/20 2202 03/29/20 0332  WBC 9.0  --  8.9  NEUTROABS  --   --  7.7  HGB 11.8* 13.3 12.1*  HCT 36.7* 39.0 37.4*  MCV 97.9  --  97.7  PLT 287  --  296   Basic Metabolic Panel: Recent Labs  Lab 03/28/20 1644 03/28/20 2202 03/29/20 0332  NA 141 140 138  K 3.9 3.7 3.9  CL 104  --  99  CO2 25  --  27  GLUCOSE 149*  --  222*  BUN 15  --  19  CREATININE 1.05  --  1.22  CALCIUM 9.5  --  9.5  MG  --   --  1.8   GFR: CrCl cannot be calculated (Unknown ideal weight.). Liver Function Tests: Recent Labs  Lab 03/29/20 0332  AST 37  ALT 43  ALKPHOS 39  BILITOT 0.7  PROT 7.7  ALBUMIN 3.8   No results for input(s): LIPASE, AMYLASE in the last 168 hours. No results for input(s): AMMONIA in the last 168 hours. Coagulation Profile: No results for input(s): INR, PROTIME in the last 168 hours. Cardiac Enzymes: No results for input(s): CKTOTAL, CKMB, CKMBINDEX, TROPONINI in the last 168 hours. BNP (last 3 results) No results for input(s): PROBNP in the last 8760 hours. HbA1C: No results for input(s): HGBA1C in the last 72 hours. CBG: No results for input(s): GLUCAP in the last 168 hours. Lipid Profile: No results for input(s): CHOL, HDL, LDLCALC, TRIG, CHOLHDL, LDLDIRECT in the last 72 hours. Thyroid Function Tests: No  results for input(s): TSH, T4TOTAL, FREET4, T3FREE, THYROIDAB in the last 72 hours. Anemia Panel: No results for input(s): VITAMINB12, FOLATE, FERRITIN, TIBC, IRON, RETICCTPCT in the last 72 hours. Urine analysis: No results found for: COLORURINE, APPEARANCEUR, LABSPEC, PHURINE, GLUCOSEU, HGBUR, BILIRUBINUR, KETONESUR, PROTEINUR, UROBILINOGEN, NITRITE, LEUKOCYTESUR Sepsis Labs: (procalcitonin:4,lacticidven:4)  ) Recent Results (from the past 240 hour(s))  SARS Coronavirus 2 by RT PCR (hospital order, performed in [facility] hospital lab) Nasopharyngeal Nasopharyngeal Swab     Status: None   Collection Time: 03/28/20  4:12 PM   Specimen: Nasopharyngeal Swab  Result Value Ref Range Status   SARS Coronavirus 2 NEGATIVE NEGATIVE Final    Comment: (NOTE) SARS-CoV-2 target nucleic acids are NOT DETECTED.  The SARS-CoV-2 RNA is generally detectable in upper and lower respiratory specimens during the acute phase of infection. The lowest concentration of SARS-CoV-2 viral copies this assay can detect is 250 copies / mL. A negative result does not preclude SARS-CoV-2 infection and should not be used as the sole basis for treatment or other patient management decisions.  A negative result may occur with improper specimen collection / handling, submission of specimen other than nasopharyngeal swab, presence of viral mutation(s) within the areas targeted by this assay, and inadequate number of viral copies (<250 copies / mL). A negative result must be combined with clinical observations, patient history, and epidemiological information.  Fact Sheet for Patients:   BoilerBrush.com.cy  Fact Sheet for Healthcare Providers: https://pope.com/  This test is not yet approved or  cleared by the [place] FDA and has been authorized for detection and/or diagnosis of SARS-CoV-2 by FDA under an Emergency Use Authorization (EUA).  This EUA  will remain in effect (meaning this test can be used) for the duration of the COVID-19 declaration under Section 564(b)(1) of the Act, 21 U.S.C. section 360bbb-3(b)(1), unless the authorization is terminated or revoked sooner.  Performed at [facility], [address] [place], [place] [address]       Studies: DG Chest 2 View  Result Date: 03/28/2020 CLINICAL DATA:  Shortness of breath, cough. EXAM: CHEST - 2 VIEW COMPARISON:  None. FINDINGS: Mild cardiomegaly is noted. No pneumothorax is noted. Diffuse reticular densities are noted throughout both lungs, most prominently seen in the lung bases, concerning for chronic interstitial lung disease, although superimposed acute inflammation or edema cannot be excluded. No significant effusion is noted. Bony thorax is unremarkable. IMPRESSION: Diffuse reticular densities are noted throughout both lungs, most prominently seen in the lung bases, concerning for chronic interstitial lung disease, although superimposed  acute inflammation or edema cannot be excluded. Electronically Signed   By: Lupita Raider M.D.   On: 03/28/2020 16:50   CT Angio Chest PE W and/or Wo Contrast  Result Date: 03/28/2020 CLINICAL DATA:  PE suspected, high prob Shortness of breath. EXAM: CT ANGIOGRAPHY CHEST WITH CONTRAST TECHNIQUE: Multidetector CT imaging of the chest was performed using the standard protocol during bolus administration of intravenous contrast. Multiplanar CT image reconstructions and MIPs were obtained to evaluate the vascular anatomy. CONTRAST:  69mL OMNIPAQUE IOHEXOL 350 MG/ML SOLN COMPARISON:  Radiograph earlier this day. No remote exams available for comparison. Report from high-resolution chest CT February 2017 at an outside institution, images not available. FINDINGS: Cardiovascular: There are no filling defects within the pulmonary arteries to suggest pulmonary embolus. Breathing motion and contrast bolus timing limits evaluation particularly at  the lung bases. Aortic atherosclerosis and tortuosity. No dissection. Conventional branching pattern from the aortic arch. Borderline cardiomegaly. There are coronary artery calcifications. No pericardial effusion. Mediastinum/Nodes: Shotty mediastinal adenopathy with multiple nodes measuring up to 11 mm most prominently involving the upper mediastinum. There are small bilateral hilar nodes. Small calcified left hilar nodes. No nodes larger than 15 mm short axis. There is a small hiatal hernia. No thyroid nodule. Lungs/Pleura: Subpleural reticulation with multifocal bronchiectasis, detailed evaluation is obscured by motion. There is a slight basilar predominant distribution. Geographic ground-glass opacity in the right upper lobe perihilar right middle lobe. No dominant pulmonary mass or consolidative airspace disease. No pleural effusion. Upper Abdomen: Hepatic steatosis.  No acute findings. Musculoskeletal: Multilevel osteophytes and degenerative change in the spine. There are no acute or suspicious osseous abnormalities. Review of the MIP images confirms the above findings. IMPRESSION: 1. No pulmonary embolus. 2. There is background interstitial lung disease with subpleural reticulation and bronchiectasis, also reported on prior exam. Geographic ground-glass opacity in the right upper lobe and perihilar right middle lobe. This may be related to interstitial lung disease or a separate process, either infectious or inflammatory. Pulmonary edema is felt less likely given the symmetry. 3. Shotty mediastinal and hilar adenopathy, likely reactive. 4. Coronary artery calcifications. 5. Incidental hepatic steatosis. Aortic Atherosclerosis (ICD10-I70.0). Electronically Signed   By: Narda Rutherford M.D.   On: 03/28/2020 19:29   ECHOCARDIOGRAM COMPLETE  Result Date: 03/29/2020    ECHOCARDIOGRAM REPORT   Patient Name:   MALVIN MORRISH Date of Exam: 03/29/2020 Medical Rec #:  [id number]     Height:       71.0 in Accession  #:    [id number]    Weight:       271.0 lb Date of Birth:  1946-05-20     BSA:          2.399 m Patient Age:    74 years      BP:           148/89 mmHg Patient Gender: M             HR:           83 bpm. Exam Location:  Inpatient Procedure: 2D Echo and Intracardiac Opacification Agent Indications:    acute systolic  History:        Patient has no prior history of Echocardiogram examinations.                 CAD; Risk Factors:Hypertension and Dyslipidemia.  Sonographer:    [name] Referring Phys: [id number] Deno Lunger [place]AL[place] IMPRESSIONS  1. Left ventricular ejection fraction, by estimation, is 50  to 55%. The left ventricle has low normal function. The left ventricle demonstrates regional wall motion abnormalities (see scoring diagram/findings for description). Left ventricular diastolic  parameters are consistent with Grade I diastolic dysfunction (impaired relaxation).  2. Right ventricular systolic function is normal. The right ventricular size is normal.  3. The mitral valve is normal in structure. No evidence of mitral valve regurgitation. No evidence of mitral stenosis.  4. The aortic valve is normal in structure. Aortic valve regurgitation is not visualized. No aortic stenosis is present.  5. 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  6. The inferior vena cava is normal in size with greater than 50% respiratory variability, suggesting right atrial pressure of 3 mmHg. FINDINGS  Left Ventricle: Left ventricular ejection fraction, by estimation, is 50 to 55%. The left ventricle has low normal function. The left ventricle demonstrates regional wall motion abnormalities. Definity contrast agent was given IV to delineate the left ventricular endocardial borders. The left ventricular internal cavity size was normal in size. There is no left ventricular hypertrophy. Left ventricular diastolic parameters are consistent with Grade I diastolic dysfunction (impaired relaxation).   LV Wall Scoring: The apex is akinetic. Right Ventricle: The right ventricular size is normal. No increase in right ventricular wall thickness. Right ventricular systolic function is normal. Left Atrium: Left atrial size was normal in size. Right Atrium: Right atrial size was normal in size. Pericardium: There is no evidence of pericardial effusion. Mitral Valve: The mitral valve is normal in structure. Normal mobility of the mitral valve leaflets. No evidence of mitral valve regurgitation. No evidence of mitral valve stenosis. Tricuspid Valve: The tricuspid valve is normal in structure. Tricuspid valve regurgitation is not demonstrated. No evidence of tricuspid stenosis. Aortic Valve: The aortic valve is normal in structure. Aortic valve regurgitation is not visualized. No aortic stenosis is present. Pulmonic Valve: The pulmonic valve was normal in structure. Pulmonic valve regurgitation is not visualized. No evidence of pulmonic stenosis. Aorta: The aortic root is normal in size and structure. 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 Venous: The inferior vena cava is normal in size with greater than 50% respiratory variability, suggesting right atrial pressure of 3 mmHg. IAS/Shunts: No atrial level shunt detected by color flow Doppler.  LEFT VENTRICLE PLAX 2D LVIDd:         5.60 cm  Diastology LVIDs:         3.50 cm  LV e' lateral:   10.60 cm/s LV PW:         1.10 cm  LV E/e' lateral: 5.1 LV IVS:        1.10 cm  LV e' medial:    6.42 cm/s LVOT diam:     2.10 cm  LV E/e' medial:  8.3 LV SV:         52 LV SV Index:   22 LVOT Area:     3.46 cm  RIGHT VENTRICLE RV S prime:     18.70 cm/s TAPSE (M-mode): 2.4 cm LEFT ATRIUM             Index LA diam:        4.30 cm 1.79 cm/m LA Vol (A2C):   50.0 ml 20.84 ml/m LA Vol (A4C):   68.7 ml 28.63 ml/m LA Biplane Vol: 59.3 ml 24.72 ml/m  AORTIC VALVE LVOT Vmax:   97.90 cm/s LVOT Vmean:  65.500 cm/s LVOT VTI:    0.151  m  AORTA Ao Root diam:  4.00 cm Ao Asc diam:  4.30 cm MV E velocity: 53.60 cm/s MV A velocity: 114.00 cm/s  SHUNTS MV E/A ratio:  0.47         Systemic VTI:  0.15 m                             Systemic Diam: 2.10 cm Donato Schultz MD Electronically signed by Donato Schultz MD Signature Date/Time: 03/29/2020/12:29:59 PM    Final     Scheduled Meds: . amLODipine  5 mg Oral Daily  . aspirin EC  81 mg Oral Daily  . clopidogrel  75 mg Oral Daily  . enoxaparin (LOVENOX) injection  40 mg Subcutaneous QHS  . isosorbide mononitrate  60 mg Oral Daily  . levothyroxine  50 mcg Oral Q0600  . methylPREDNISolone (SOLU-MEDROL) injection  60 mg Intravenous Q8H  . metoprolol tartrate  25 mg Oral BID  . multivitamin with minerals  1 tablet Oral Daily  . pantoprazole  40 mg Oral Daily  . Pirfenidone  1 tablet Oral TID  . pravastatin  20 mg Oral Daily    Continuous Infusions: . levofloxacin (LEVAQUIN) IV       LOS: 1 day     Darlin Drop, MD [facility] Pager [phone]  If 7PM-7AM, please contact night-coverage [email or web address] Password [facility] 03/29/2020, 3:55 PM

## 2020-03-29 NOTE — ED Notes (Signed)
Breakfast Ordered 

## 2020-03-30 DIAGNOSIS — R0602 Shortness of breath: Secondary | ICD-10-CM

## 2020-03-30 DIAGNOSIS — J9621 Acute and chronic respiratory failure with hypoxia: Secondary | ICD-10-CM

## 2020-03-30 DIAGNOSIS — J849 Interstitial pulmonary disease, unspecified: Secondary | ICD-10-CM

## 2020-03-30 DIAGNOSIS — J84112 Idiopathic pulmonary fibrosis: Principal | ICD-10-CM

## 2020-03-30 LAB — CBC WITH DIFFERENTIAL/PLATELET
Abs Immature Granulocytes: 0.13 10*3/uL — ABNORMAL HIGH (ref 0.00–0.07)
Basophils Absolute: 0 10*3/uL (ref 0.0–0.1)
Basophils Relative: 0 %
Eosinophils Absolute: 0 10*3/uL (ref 0.0–0.5)
Eosinophils Relative: 0 %
HCT: 38 % — ABNORMAL LOW (ref 39.0–52.0)
Hemoglobin: 12.7 g/dL — ABNORMAL LOW (ref 13.0–17.0)
Immature Granulocytes: 1 %
Lymphocytes Relative: 8 %
Lymphs Abs: 1.3 10*3/uL (ref 0.7–4.0)
MCH: 31.9 pg (ref 26.0–34.0)
MCHC: 33.4 g/dL (ref 30.0–36.0)
MCV: 95.5 fL (ref 80.0–100.0)
Monocytes Absolute: 1.7 10*3/uL — ABNORMAL HIGH (ref 0.1–1.0)
Monocytes Relative: 10 %
Neutro Abs: 14.5 10*3/uL — ABNORMAL HIGH (ref 1.7–7.7)
Neutrophils Relative %: 81 %
Platelets: 344 10*3/uL (ref 150–400)
RBC: 3.98 MIL/uL — ABNORMAL LOW (ref 4.22–5.81)
RDW: 13.8 % (ref 11.5–15.5)
WBC: 17.6 10*3/uL — ABNORMAL HIGH (ref 4.0–10.5)
nRBC: 0 % (ref 0.0–0.2)

## 2020-03-30 LAB — BASIC METABOLIC PANEL
Anion gap: 15 (ref 5–15)
BUN: 37 mg/dL — ABNORMAL HIGH (ref 8–23)
CO2: 28 mmol/L (ref 22–32)
Calcium: 9.8 mg/dL (ref 8.9–10.3)
Chloride: 96 mmol/L — ABNORMAL LOW (ref 98–111)
Creatinine, Ser: 1.38 mg/dL — ABNORMAL HIGH (ref 0.61–1.24)
GFR calc Af Amer: 58 mL/min — ABNORMAL LOW (ref >60)
GFR calc non Af Amer: 50 mL/min — ABNORMAL LOW (ref >60)
Glucose, Bld: 182 mg/dL — ABNORMAL HIGH (ref 70–99)
Potassium: 3.8 mmol/L (ref 3.5–5.1)
Sodium: 139 mmol/L (ref 135–145)

## 2020-03-30 LAB — MAGNESIUM: Magnesium: 2.2 mg/dL (ref 1.7–2.4)

## 2020-03-30 LAB — PROCALCITONIN: Procalcitonin: <0.1 ng/mL

## 2020-03-30 MED ORDER — SODIUM CHLORIDE 0.9 % IV SOLN
2.0000 g | Freq: Two times a day (BID) | INTRAVENOUS | Status: DC
  Administered 2020-03-30 – 2020-04-01 (×5): 2 g via INTRAVENOUS
  Filled 2020-03-30 (×6): qty 2

## 2020-03-30 MED ORDER — METHYLPREDNISOLONE SODIUM SUCC 125 MG IJ SOLR
125.0000 mg | Freq: Every day | INTRAMUSCULAR | Status: DC
  Administered 2020-03-31: 125 mg via INTRAVENOUS
  Filled 2020-03-30: qty 2

## 2020-03-30 MED ORDER — SODIUM CHLORIDE 0.9 % IV SOLN
2.0000 g | Freq: Three times a day (TID) | INTRAVENOUS | Status: DC
  Filled 2020-03-30 (×2): qty 2

## 2020-03-30 MED ORDER — SACCHAROMYCES BOULARDII 250 MG PO CAPS
250.0000 mg | ORAL_CAPSULE | Freq: Two times a day (BID) | ORAL | Status: DC
  Administered 2020-03-30 – 2020-04-01 (×5): 250 mg via ORAL
  Filled 2020-03-30 (×5): qty 1

## 2020-03-30 MED ORDER — MORPHINE SULFATE 10 MG/5ML PO SOLN
5.0000 mg | ORAL | Status: DC | PRN
  Administered 2020-03-31 – 2020-04-01 (×2): 5 mg via ORAL
  Filled 2020-03-30 (×2): qty 4

## 2020-03-30 MED ORDER — TRAZODONE HCL 50 MG PO TABS
50.0000 mg | ORAL_TABLET | Freq: Every day | ORAL | Status: DC
  Administered 2020-03-30 – 2020-04-01 (×3): 50 mg via ORAL
  Filled 2020-03-30 (×3): qty 1

## 2020-03-30 MED ORDER — MELATONIN 5 MG PO TABS
5.0000 mg | ORAL_TABLET | Freq: Every day | ORAL | Status: DC
  Administered 2020-03-30 – 2020-04-01 (×3): 5 mg via ORAL
  Filled 2020-03-30 (×3): qty 1

## 2020-03-30 MED ORDER — SENNOSIDES-DOCUSATE SODIUM 8.6-50 MG PO TABS
2.0000 | ORAL_TABLET | Freq: Two times a day (BID) | ORAL | Status: DC
  Administered 2020-03-30 – 2020-04-02 (×7): 2 via ORAL
  Filled 2020-03-30 (×7): qty 2

## 2020-03-30 NOTE — Progress Notes (Signed)
Pharmacy Antibiotic Note  Douglas Lopez is a 74 y.o. male admitted on 03/28/2020 with pneumonia.  Pharmacy has been consulted for cefepime dosing.  Assessment: Patient has leukocytosis after possible aspiration event. Patient was found vomiting followed by low O2 sats, patient has pulmonary fibrosis. Renal function is worsened inpatient compared to baseline Scr around 1.   Plan: Cefepime 2g q12hr  Monitor renal function Follow cultures and antibiotic plan  Height: 5\' 11"  (180.3 cm) Weight: 120.3 kg (265 lb 3.4 oz) IBW/kg (Calculated) : 75.3  Temp (24hrs), Avg:98 F (36.7 C), Min:97.7 F (36.5 C), Max:98.3 F (36.8 C)  Recent Labs  Lab 03/28/20 1644 03/29/20 0332 03/30/20 0705  WBC 9.0 8.9 17.6*  CREATININE 1.05 1.22 1.38*    Estimated Creatinine Clearance: 62 mL/min (A) (by C-G formula based on SCr of 1.38 mg/dL (H)).    No Known Allergies  Antimicrobials this admission: Levofloxacin 8/26 >> 8/28 Cefepime 8/28 >>  Thank you for allowing pharmacy to be a part of this patient's care.  9/28, PharmD PGY1 Pharmacy Resident 03/30/2020 11:25 AM

## 2020-03-30 NOTE — Progress Notes (Signed)
Pt had a large amount of gastric emptying with food particles. While vomiting pt oxygen saturation dropped to the 70s but with pursed lips breading exercise, pt O2 level is now 94. We'll continue to monitor.

## 2020-03-30 NOTE — Progress Notes (Signed)
PROGRESS NOTE  Douglas Lopez KJZ:[id number] DOB: October 30, 1945 DOA: 03/28/2020 PCP: Macy Mis, MD  HPI/Recap of past 83 hours: 74 year old male with past medical history of idiopathic pulmonary fibrosis, hypertension, gastroesophageal reflux disease, hyperlipidemia, coronary artery disease status post cath with stent x 3 (11/2016), chronic respiratory failure (currently on 9-10LPM at rest at home), hypothyroidism who presents to [facility] emergency department from his pulmonologist office due to progressively worsening shortness of breath.  Patient explains that for the past 3 months, his shortness of breath and oxygen requirements have gradually increased.  Patient was using 4 L of oxygen via nasal cannula in May, followed by 8 L in June.  Patient is now using 9 L of oxygen via nasal cannula as of early August.  Patient explains that for approximately the past 4 days he has had a rapid acceleration in the degree of shortness of breath he is experiencing.  Shortness of breath is constant severe in intensity, worse with minimal exertion with only some improvement with rest.  Patient denies any associated chest pain.  Patient does complain of severe, typically nonproductive cough that has worsened over the span of time.  Patient is complaining of associated left flank and abdominal pain occurring with deep inspiration and cough.  Patient denies fevers, sick contacts, recent travel or confirmed contact with COVID-19 infection.  As patient symptoms worsened in the following days, patient noticed occasional bouts of severe hypoxia, as low as 60% with exertion.  Of note, patient is status post Moderna COVID-19 vaccination in February and March 2021.  Patient initially presented to see his pulmonologist Dr. Ileene Hutchinson on 03/28/20.  Due to observed respiratory distress and hypoxia in the pulmonology clinic the patient was sent to [facility] emergency department for evaluation and  administration of high-dose steroids, antibiotic therapy and diuretics.  Upon evaluation in the emergency department patient was found to be in respiratory distress.  CT imaging of the chest was performed revealing mediastinal hilar adenopathy with bilateral groundglass opacities, particularly in the right upper lobe and right middle lobe.  Patient was placed on intravenous levofloxacin, intravenous Solu-Medrol and intravenous Lasix.  The hospitalist group was then called to assess the patient for admission to the hospital.  03/30/20: Seen and examined in his room.  Still has significant drop in his oxygen saturation with minimal movement.  Currently on 10 L high flow nasal cannula.  Seen by pulmonary.  Appreciate recommendations.   Assessment/Plan: Principal Problem:   Acute exacerbation of idiopathic pulmonary fibrosis (HCC) Active Problems:   Mixed hyperlipidemia   Coronary artery disease of native artery of native heart with stable angina pectoris (HCC)   Acute on chronic respiratory failure with hypoxia (HCC)   GERD without esophagitis   Essential hypertension   Hypothyroidism   Palliative care by specialist   Goals of care, counseling/discussion   Advanced directive placed in chart this admission   Shortness of breath   DNR (do not resuscitate)   DNI (do not intubate)   Encounter for hospice care discussion   Interstitial lung disease (HCC)   Acute exacerbation of idiopathic pulmonary fibrosis (HCC)  Patient presenting with several month history of progressive shortness of breath and dyspnea on exertion, symptoms have particularly worsened over the past few days.  CT imaging is seemingly consistent with progressive pulmonary fibrosis although a superimposed atypical infectious process or pulmonary edema is possible.  In review of Dr. Dennard Nip pulmonology note from 8/26, he recommends initiation of a combination  of high-dose steroid therapy, intravenous diuretics and antibiotic  therapy.  Placing patient on Solu-Medrol 60 mg every 8, a trial of intravenous Lasix, Levaquin switched to cefepime on 03/30/2020.  Starting 03/31/2020, switch to IV Solu-Medrol 125 mg daily, then later switch to prednisone 60 mg daily (04/01/20) and taper off outpatient.  Continue as needed bronchodilator therapy  Continue as needed antitussive therapy  Continue supplemental oxygen necessary to achieve oxygen saturations of between 88 and 92%.  Patient is typically on CPAP nightly, 16 cmH2O -we will admit to progressive care unit in case noninvasive positive pressure ventilation needs to be used more often.  2D echo done 8/27 showing LVEF 50 to 55% with grade 1 diastolic dysfunction  Will obtain pulmonary consult.  Seen by pulmonary, appreciate recommendations stated above.    Acute on chronic respiratory failure with Hypoxia (HCC)  Please see assessment and plan above    Coronary artery disease of native artery of native heart with stable angina pectoris (HCC)   Denies any chest pain Continue to closely monitor on telemetry Continue beta-blocker, statin and antiplatelet.    Essential hypertension  BP stable Continue home regimen amlodipine, metoprolol Continue to monitor vital signs    Hypothyroidism   Continue home regimen of Synthroid    Mixed hyperlipidemia   Continue home regimen of statin therapy    GERD without esophagitis   Continue home regimen of PPI  Steroid-induced insomnia Melatonin nightly Trazodone nightly  Goals of Care   Patient is suffering from advanced sequela of idiopathic pulmonary fibrosis, Prognosis is rather poor  If patient does not respond to current plan of care with steroids, diuretics and antibiotics, hospice may be an appropriate option  I have discussed this at length with the patient, he is open to the possibility of hospice if his symptoms do not rapidly improve.  We will order palliative care  consultation.  Patient is currently DNR/DNI  Palliative care team following.   Status is: Inpatient    Dispo: The patient is from:Home               Anticipated d/c is ZO:XWRUto:Home              Anticipated d/c date is:04/01/20              Patient currently not clinically stable for dc due to ongoing management of acute exacerbation of severe pulmonary fibrosis.        Objective: Vitals:   03/30/20 0857 03/30/20 1000 03/30/20 1100 03/30/20 1639  BP: (!) 142/58  132/71 (!) 149/74  Pulse:    80  Resp: 14  (!) 25 20  Temp: 97.8 F (36.6 C)  98.4 F (36.9 C) 98.4 F (36.9 C)  TempSrc: Oral  Oral Oral  SpO2: 94%  94% 95%  Weight:  120.3 kg 120.3 kg   Height:  5\' 11"  (1.803 m) 5\' 11"  (1.803 m)     Intake/Output Summary (Last 24 hours) at 03/30/2020 1739 Last data filed at 03/30/2020 1125 Gross per 24 hour  Intake 150 ml  Output 1900 ml  Net -1750 ml   Filed Weights   03/30/20 1000 03/30/20 1100  Weight: 120.3 kg 120.3 kg    Exam:  . General: 74 y.o. year-old male pleasant well-developed well-nourished in no acute distress.  Alert and oriented x3. . Cardiovascular: Regular rate and rhythm no rubs or gallops.   Marland Kitchen. Respiratory: Diffuse rales bilaterally.  No wheezing noted.   . Abdomen: Soft nontender normal  bowel sounds present. . Musculoskeletal: No lower extremity edema bilaterally.   Marland Kitchen Psychiatry: Mood is appropriate for condition .  Data Reviewed: CBC: Recent Labs  Lab 03/28/20 1644 03/28/20 2202 03/29/20 0332 03/30/20 0705  WBC 9.0  --  8.9 17.6*  NEUTROABS  --   --  7.7 14.5*  HGB 11.8* 13.3 12.1* 12.7*  HCT 36.7* 39.0 37.4* 38.0*  MCV 97.9  --  97.7 95.5  PLT 287  --  296 344   Basic Metabolic Panel: Recent Labs  Lab 03/28/20 1644 03/28/20 2202 03/29/20 0332 03/30/20 0705  NA 141 140 138 139  K 3.9 3.7 3.9 3.8  CL 104  --  99 96*  CO2 25  --  27 28  GLUCOSE 149*  --  222* 182*  BUN 15  --  19 37*  CREATININE 1.05  --  1.22 1.38*  CALCIUM  9.5  --  9.5 9.8  MG  --   --  1.8 2.2   GFR: Estimated Creatinine Clearance: 62 mL/min (A) (by C-G formula based on SCr of 1.38 mg/dL (H)). Liver Function Tests: Recent Labs  Lab 03/29/20 0332  AST 37  ALT 43  ALKPHOS 39  BILITOT 0.7  PROT 7.7  ALBUMIN 3.8   No results for input(s): LIPASE, AMYLASE in the last 168 hours. No results for input(s): AMMONIA in the last 168 hours. Coagulation Profile: No results for input(s): INR, PROTIME in the last 168 hours. Cardiac Enzymes: No results for input(s): CKTOTAL, CKMB, CKMBINDEX, TROPONINI in the last 168 hours. BNP (last 3 results) No results for input(s): PROBNP in the last 8760 hours. HbA1C: No results for input(s): HGBA1C in the last 72 hours. CBG: No results for input(s): GLUCAP in the last 168 hours. Lipid Profile: No results for input(s): CHOL, HDL, LDLCALC, TRIG, CHOLHDL, LDLDIRECT in the last 72 hours. Thyroid Function Tests: No results for input(s): TSH, T4TOTAL, FREET4, T3FREE, THYROIDAB in the last 72 hours. Anemia Panel: No results for input(s): VITAMINB12, FOLATE, FERRITIN, TIBC, IRON, RETICCTPCT in the last 72 hours. Urine analysis: No results found for: COLORURINE, APPEARANCEUR, LABSPEC, PHURINE, GLUCOSEU, HGBUR, BILIRUBINUR, KETONESUR, PROTEINUR, UROBILINOGEN, NITRITE, LEUKOCYTESUR Sepsis Labs: @LABRCNTIP (procalcitonin:4,lacticidven:4)  ) Recent Results (from the past 240 hour(s))  SARS Coronavirus 2 by RT PCR (hospital order, performed in [facility] hospital lab) Nasopharyngeal Nasopharyngeal Swab     Status: None   Collection Time: 03/28/20  4:12 PM   Specimen: Nasopharyngeal Swab  Result Value Ref Range Status   SARS Coronavirus 2 NEGATIVE NEGATIVE Final    Comment: (NOTE) SARS-CoV-2 target nucleic acids are NOT DETECTED.  The SARS-CoV-2 RNA is generally detectable in upper and lower respiratory specimens during the acute phase of infection. The lowest concentration of SARS-CoV-2 viral copies this  assay can detect is 250 copies / mL. A negative result does not preclude SARS-CoV-2 infection and should not be used as the sole basis for treatment or other patient management decisions.  A negative result may occur with improper specimen collection / handling, submission of specimen other than nasopharyngeal swab, presence of viral mutation(s) within the areas targeted by this assay, and inadequate number of viral copies (<250 copies / mL). A negative result must be combined with clinical observations, patient history, and epidemiological information.  Fact Sheet for Patients:   03/30/20  Fact Sheet for Healthcare Providers: BoilerBrush.com.cy  This test is not yet approved or  cleared by the https://pope.com/ FDA and has been authorized for detection and/or diagnosis of  SARS-CoV-2 by FDA under an Emergency Use Authorization (EUA).  This EUA will remain in effect (meaning this test can be used) for the duration of the COVID-19 declaration under Section 564(b)(1) of the Act, 21 U.S.C. section 360bbb-3(b)(1), unless the authorization is terminated or revoked sooner.  Performed at [facility], [address], [place], [place] [address]       Studies: No results found.  Scheduled Meds: . amLODipine  5 mg Oral Daily  . aspirin EC  81 mg Oral Daily  . clopidogrel  75 mg Oral Daily  . enoxaparin (LOVENOX) injection  40 mg Subcutaneous QHS  . furosemide  20 mg Intravenous TID  . isosorbide mononitrate  60 mg Oral Daily  . levothyroxine  50 mcg Oral Q0600  . melatonin  5 mg Oral QHS  . [START ON 03/31/2020] methylPREDNISolone (SOLU-MEDROL) injection  125 mg Intravenous Daily  . methylPREDNISolone (SOLU-MEDROL) injection  60 mg Intravenous Q8H  . metoprolol tartrate  25 mg Oral BID  . multivitamin with minerals  1 tablet Oral Daily  . pantoprazole  40 mg Oral Daily  . Pirfenidone  1 tablet Oral TID WC  . pravastatin  20  mg Oral Daily  . saccharomyces boulardii  250 mg Oral BID  . senna-docusate  2 tablet Oral BID  . traZODone  50 mg Oral QHS    Continuous Infusions: . ceFEPime (MAXIPIME) IV 2 g (03/30/20 1254)     LOS: 2 days     Darlin Drop, MD [facility] Pager [phone]  If 7PM-7AM, please contact night-coverage [email or web address] Password [facility] 03/30/2020, 5:39 PM

## 2020-03-31 DIAGNOSIS — K219 Gastro-esophageal reflux disease without esophagitis: Secondary | ICD-10-CM

## 2020-03-31 DIAGNOSIS — I10 Essential (primary) hypertension: Secondary | ICD-10-CM

## 2020-03-31 LAB — BASIC METABOLIC PANEL
Anion gap: 13 (ref 5–15)
BUN: 39 mg/dL — ABNORMAL HIGH (ref 8–23)
CO2: 28 mmol/L (ref 22–32)
Calcium: 9.3 mg/dL (ref 8.9–10.3)
Chloride: 95 mmol/L — ABNORMAL LOW (ref 98–111)
Creatinine, Ser: 1.37 mg/dL — ABNORMAL HIGH (ref 0.61–1.24)
GFR calc Af Amer: 58 mL/min — ABNORMAL LOW (ref >60)
GFR calc non Af Amer: 50 mL/min — ABNORMAL LOW (ref >60)
Glucose, Bld: 195 mg/dL — ABNORMAL HIGH (ref 70–99)
Potassium: 3.9 mmol/L (ref 3.5–5.1)
Sodium: 136 mmol/L (ref 135–145)

## 2020-03-31 LAB — CBC WITH DIFFERENTIAL/PLATELET
Abs Immature Granulocytes: 0.1 10*3/uL — ABNORMAL HIGH (ref 0.00–0.07)
Basophils Absolute: 0 10*3/uL (ref 0.0–0.1)
Basophils Relative: 0 %
Eosinophils Absolute: 0 10*3/uL (ref 0.0–0.5)
Eosinophils Relative: 0 %
HCT: 37.7 % — ABNORMAL LOW (ref 39.0–52.0)
Hemoglobin: 12.4 g/dL — ABNORMAL LOW (ref 13.0–17.0)
Immature Granulocytes: 1 %
Lymphocytes Relative: 7 %
Lymphs Abs: 1.2 10*3/uL (ref 0.7–4.0)
MCH: 31.5 pg (ref 26.0–34.0)
MCHC: 32.9 g/dL (ref 30.0–36.0)
MCV: 95.7 fL (ref 80.0–100.0)
Monocytes Absolute: 0.9 10*3/uL (ref 0.1–1.0)
Monocytes Relative: 6 %
Neutro Abs: 13.9 10*3/uL — ABNORMAL HIGH (ref 1.7–7.7)
Neutrophils Relative %: 86 %
Platelets: 314 10*3/uL (ref 150–400)
RBC: 3.94 MIL/uL — ABNORMAL LOW (ref 4.22–5.81)
RDW: 13.8 % (ref 11.5–15.5)
WBC: 16.1 10*3/uL — ABNORMAL HIGH (ref 4.0–10.5)
nRBC: 0 % (ref 0.0–0.2)

## 2020-03-31 LAB — PROCALCITONIN: Procalcitonin: <0.1 ng/mL

## 2020-03-31 MED ORDER — METOPROLOL TARTRATE 25 MG PO TABS
37.5000 mg | ORAL_TABLET | Freq: Two times a day (BID) | ORAL | Status: DC
  Administered 2020-03-31 – 2020-04-02 (×4): 37.5 mg via ORAL
  Filled 2020-03-31 (×4): qty 1

## 2020-03-31 MED ORDER — BISACODYL 10 MG RE SUPP
10.0000 mg | Freq: Once | RECTAL | Status: AC
  Administered 2020-03-31: 10 mg via RECTAL
  Filled 2020-03-31: qty 1

## 2020-03-31 NOTE — Consult Note (Signed)
NAME:  Douglas Lopez, MRN:  [id number], DOB:  [birth year], LOS: 3 ADMISSION DATE:  03/28/2020, CONSULTATION DATE:  8/27 REFERRING MD:  Dr Margo Aye, CHIEF COMPLAINT:  Hypoxia with IPF history   Brief History   74 year old male with well documented IPF history who has had increasing O2 demands over the past few months up to 15 LPM. He was directed to the ED by his pulmonologist in [place] for further workup.   History of present illness   74 year old male with PMH as below, which is significant for CAD, HTN, Hypothyroidism, OSA on CPAP, and idiopathic pulmonary fibrosis. He is vaccinated for COVID-19 infection. He has had extensive workup for this by Dr. Ileene Hutchinson in the [facility] clinic. He became symptomatic in 2012 and was initially treated for bronchitis and pnuemonia many times, but ultimately had CT scan in 2017 at which point he was diagnosed. He has not undergone lung biopsy and was instead started on pirfenidone in July of 2017. He is also managed on prednisone 10mg  daily.  He has noted a fairly sharp decline over the past 6 months slowly requiring increases in his baseline oxygen from 8L up to 15 L with activity and was still desaturating to the high 60s at times. He presented to pulmonary clinic on 8/26 with these complaints where he failed 6 minute walk test after 4 mins and desaturated to 83%. He was referred to the hospital with recommendations for high dose steroids, antibiotics, and diuresis. He is DNR. Pulmonologist feels his prognosis is days to months.   Past Medical History   has a past medical history of Arthritis, Bleeding stomach ulcer (1984), Chronic bronchitis (HCC), Coronary artery disease, High cholesterol, History of blood transfusion (1984), Hypertension, Hypothyroidism, Idiopathic pulmonary fibrosis (HCC), OSA on CPAP, Pneumonia, and Right bundle branch block.  Significant Hospital Events   8/27 admit  Consults:  PCCM Palliative  Procedures:    Significant  Diagnostic Tests:    Micro Data:    Antimicrobials:  Levofloxacin 8/27 >   Interim history/subjective:  Feels ok. Desats with minimal exertion.   Objective   Blood pressure 138/67, pulse 85, temperature 98.2 F (36.8 C), temperature source Oral, resp. rate 20, height 5\' 11"  (1.803 m), weight 120.3 kg, SpO2 94 %.        Intake/Output Summary (Last 24 hours) at 03/31/2020 1632 Last data filed at 03/31/2020 1500 Gross per 24 hour  Intake 332 ml  Output 2550 ml  Net -2218 ml   Filed Weights   03/30/20 1000 03/30/20 1100 03/31/20 0536  Weight: 120.3 kg 120.3 kg 120.3 kg    Examination: General: Chronically ill appearing elderly male lying in bed in NAD HEENT: [place]/AT, MM pink/moist, PERRL,  Neuro: Alert and oriented x3, non-focal CV: s1s2 regular rate and rhythm, no murmur, rubs, or gallops,  PULM:  Diminished breath sounds, fine crackles in bases, non productive cough, ooxygen saturations 91-95 on 15 HFNC GI: soft, bowel sounds active in all 4 quadrants, non-tender, non-distended Extremities: warm/dry, no edema  Skin: no rashes or lesions  Resolved Hospital Problem list     Assessment & Plan:   Acute on chronic hypoxemic respiratory failure due to chronic IPF: with likely IPF flare causing worsening. No signs or symptoms of pneumonia. No volume overload on exam, TTE reassuring, unusual for pulmonary edema to be one sided outside of MVR (not present) or unilateral PV stenosis. Unfortunately, IPF flares do not usually respond well to steroids. Worry areas of  GGOs will develop into scar/firbosis. Discussed his desire for hospice at home which is appropriate. Encouraged him that these resources would be good to meet his needs once he leaves the hospital.  --Solumedrol to 125 mg once daily today to complete 3 days of at least 1 mg/kg --Start prednisone 60 mg daily Monday 8/30, plan for outpatient taper (60 mg x 7 days, 30 mg x 7 days, 20 mg x 7 days, then resume home 10 mg  daily). --Continue aggressive diuresis as BP and kidney function allows --Recommend de-escalate to CAP coverage if abx desired  --Morphine solution 5 mg PO q4 hrs PRN air hunger --Appreciate assistance from palliative care colleagues   Labs   CBC: Recent Labs  Lab 03/28/20 1644 03/28/20 2202 03/29/20 0332 03/30/20 0705 03/31/20 0258  WBC 9.0  --  8.9 17.6* 16.1*  NEUTROABS  --   --  7.7 14.5* 13.9*  HGB 11.8* 13.3 12.1* 12.7* 12.4*  HCT 36.7* 39.0 37.4* 38.0* 37.7*  MCV 97.9  --  97.7 95.5 95.7  PLT 287  --  296 344 314    Basic Metabolic Panel: Recent Labs  Lab 03/28/20 1644 03/28/20 2202 03/29/20 0332 03/30/20 0705 03/31/20 0258  NA 141 140 138 139 136  K 3.9 3.7 3.9 3.8 3.9  CL 104  --  99 96* 95*  CO2 25  --  27 28 28   GLUCOSE 149*  --  222* 182* 195*  BUN 15  --  19 37* 39*  CREATININE 1.05  --  1.22 1.38* 1.37*  CALCIUM 9.5  --  9.5 9.8 9.3  MG  --   --  1.8 2.2  --    GFR: Estimated Creatinine Clearance: 62.4 mL/min (A) (by C-G formula based on SCr of 1.37 mg/dL (H)). Recent Labs  Lab 03/28/20 1644 03/29/20 0332 03/30/20 0705 03/31/20 0258  PROCALCITON  --   --  <0.10 <0.10  WBC 9.0 8.9 17.6* 16.1*    Liver Function Tests: Recent Labs  Lab 03/29/20 0332  AST 37  ALT 43  ALKPHOS 39  BILITOT 0.7  PROT 7.7  ALBUMIN 3.8   No results for input(s): LIPASE, AMYLASE in the last 168 hours. No results for input(s): AMMONIA in the last 168 hours.  ABG    Component Value Date/Time   PHART 7.462 (H) 03/28/2020 2202   PCO2ART 45.3 03/28/2020 2202   PO2ART 110 (H) 03/28/2020 2202   HCO3 32.4 (H) 03/28/2020 2202   TCO2 34 (H) 03/28/2020 2202   O2SAT 99.0 03/28/2020 2202     Coagulation Profile: No results for input(s): INR, PROTIME in the last 168 hours.  Cardiac Enzymes: No results for input(s): CKTOTAL, CKMB, CKMBINDEX, TROPONINI in the last 168 hours.  HbA1C: No results found for: HGBA1C  CBG: No results for input(s): GLUCAP in the  last 168 hours.  Review of Systems:   Bolds are positive  Constitutional: weight loss, gain, night sweats, Fevers, chills, fatigue .  HEENT: headaches, Sore throat, sneezing, nasal congestion, post nasal drip, Difficulty swallowing, Tooth/dental problems, visual complaints visual changes, ear ache CV:  chest pain, radiates:,Orthopnea, PND, swelling in lower extremities, dizziness, palpitations, syncope.  GI  heartburn, indigestion, abdominal pain, nausea, vomiting, diarrhea, change in bowel habits, loss of appetite, bloody stools.  Resp: cough, productive: , hemoptysis, dyspnea, chest pain, pleuritic.  Skin: rash or itching or icterus GU: dysuria, change in color of urine, urgency or frequency. flank pain, hematuria  MS: joint pain or swelling. decreased  range of motion  Psych: change in mood or affect. depression or anxiety.  Neuro: difficulty with speech, weakness, numbness, ataxia   Past Medical History  He,  has a past medical history of Arthritis, Bleeding stomach ulcer (1984), Chronic bronchitis (HCC), Coronary artery disease, High cholesterol, History of blood transfusion (1984), Hypertension, Hypothyroidism, Idiopathic pulmonary fibrosis (HCC), OSA on CPAP, Pneumonia, and Right bundle branch block.   Surgical History    Past Surgical History:  Procedure Laterality Date  . CATARACT EXTRACTION W/ INTRAOCULAR LENS  IMPLANT, BILATERAL Bilateral 1994  . CORONARY ANGIOPLASTY WITH STENT PLACEMENT  11/05/2016  . CORONARY STENT INTERVENTION N/A 11/05/2016   Procedure: Coronary Stent Intervention;  Surgeon: Corky Crafts, MD;  Location: [facility] INVASIVE CV LAB;  Service: Cardiovascular;  Laterality: N/A;  . CYSTOSCOPY W/ STONE MANIPULATION  1994  . INGUINAL HERNIA REPAIR Left 1972  . KNEE ARTHROSCOPY Bilateral 1994  . RIGHT/LEFT HEART CATH AND CORONARY ANGIOGRAPHY N/A 10/26/2016   Procedure: Right/Left Heart Cath and Coronary Angiography;  Surgeon: Corky Crafts, MD;  Location: [facility]  INVASIVE CV LAB;  Service: Cardiovascular;  Laterality: N/A;  . TONSILLECTOMY  1950s     Social History   reports that he has never smoked. He has never used smokeless tobacco. He reports that he does not drink alcohol and does not use drugs.   Family History   His family history includes Heart disease in his father and mother.   Allergies No Known Allergies   Home Medications  Prior to Admission medications   Medication Sig Start Date End Date Taking? Authorizing Provider  amLODipine (NORVASC) 5 MG tablet Take 5 mg by mouth in the morning.  04/13/16  Yes [provider]  aspirin 81 MG tablet Take 81 mg by mouth in the morning.    Yes [provider]  clopidogrel (PLAVIX) 75 MG tablet Take 1 tablet (75 mg total) by mouth daily. Patient taking differently: Take 75 mg by mouth in the morning.  08/25/19  Yes Corky Crafts, MD  fluticasone [facility]) 50 MCG/ACT nasal spray Place 2 sprays into both nostrils daily as needed for allergies or rhinitis.    Yes [provider]  isosorbide mononitrate (IMDUR) 60 MG 24 hr tablet TAKE 1 TABLET EVERY DAY Patient taking differently: Take 60 mg by mouth in the morning.  08/25/19  Yes Corky Crafts, MD  levothyroxine (SYNTHROID, LEVOTHROID) 50 MCG tablet Take 50 mcg by mouth daily before breakfast.   Yes [provider]  metoprolol tartrate (LOPRESSOR) 25 MG tablet Take 1 tablet (25 mg total) by mouth 2 (two) times daily. 08/25/19  Yes Corky Crafts, MD  Multiple Vitamins-Minerals (MULTIVITAMIN WITH MINERALS) tablet Take 1 tablet by mouth daily.   Yes [provider]  nitroGLYCERIN (NITROSTAT) 0.4 MG SL tablet DISSOLVE 1 TABLET UNDER THE TONGUE EVERY 5 MINUTES AS NEEDED FOR CHEST PAIN. PLEASE MAKE APPOINTMENT FOR DECEMBER Patient taking differently: Place 0.4 mg under the tongue every 5 (five) hours as needed for chest pain.  10/31/19  Yes Corky Crafts, MD  OXYGEN Inhale 10 L/min into the  lungs continuous.   Yes [provider]  pantoprazole (PROTONIX) 40 MG tablet TAKE 1 TABLET EVERY DAY Patient taking differently: Take 40 mg by mouth daily before breakfast.  08/25/19  Yes Corky Crafts, MD  Pirfenidone 801 MG TABS Take 801 mg by mouth with breakfast, with lunch, and with evening meal.    Yes [provider]  pravastatin (  PRAVACHOL) 20 MG tablet Take 20 mg by mouth daily.   Yes [provider]  PARoxetine (PAXIL) 10 MG tablet Take by mouth. Patient not taking: Reported on 03/28/2020 07/05/19   [provider]     Signature  Delfin GantWhitney F Davis, NP-C [facility] Contact / Pager information can be found on Amion  03/31/2020, 4:32 PM

## 2020-03-31 NOTE — TOC Initial Note (Signed)
Transition of Care [facility]) - Initial/Assessment Note    Patient Details  Name: Douglas Lopez MRN: [id number] Date of Birth: March 19, 1946  Transition of Care [facility]) CM/SW Contact:    Gala Lewandowsky, RN Phone Number: 03/31/2020, 3:07 PM  Clinical Narrative:  Case Manager received message that the patient will need home hospice. Wife at the bedside for the conversation. Medicare.Gov list-choice offered and patient chose [facility] for services. Case Manager called referral to [facility] and they can service the patient. Case Manager discussed durable medical equipment (DME) needs and patient will need wheelchair, bedside commode, and rolling walker. Patient has high flow 02 with [employer], a rollator, and a tempur-pedic bed in the home. [facility] will discuss additional equipment needed. Per patient the plan will be to transition home by Wednesday. Case Manager will continue to follow for additional transition of care needs.                Expected Discharge Plan: Home w Hospice Care Barriers to Discharge: No Barriers Identified   Patient Goals and CMS Choice Patient states their goals for this hospitalization and ongoing recovery are:: to return home with hospice services. CMS Medicare.gov Compare Post Acute Care list provided to:: Patient Choice offered to / list presented to : Patient, Spouse  Expected Discharge Plan and Services Expected Discharge Plan: Home w Hospice Care In-house Referral: NA   Post Acute Care Choice: Hospice Living arrangements for the past 2 months: Single Family Home                   DME Agency:  ([facility] [place], [facility]) Date DME Agency Contacted: 03/31/20 Time DME Agency Contacted: [phone] Representative spoke with at DME Agency: Pat with [facility]. HH Arranged: RN HH Agency: [facility] Date HH Agency Contacted: 03/31/20 Time HH Agency Contacted: [phone] Representative spoke with at [facility] Agency:  Dennie Bible  Prior Living Arrangements/Services Living arrangements for the past 2 months: Single Family Home Lives with:: Spouse Patient language and need for interpreter reviewed:: Yes Do you feel safe going back to the place where you live?: Yes      Need for Family Participation in Patient Care: Yes (Comment) Care giver support system in place?: Yes (comment)   Criminal Activity/Legal Involvement Pertinent to Current Situation/Hospitalization: No - Comment as needed  Permission Sought/Granted Permission sought to share information with : Family Supports, Magazine features editor, Case Estate manager/land agent granted to share information with : Yes, Verbal Permission Granted     Permission granted to share info w AGENCY: [facility]        Emotional Assessment Appearance:: Appears stated age Attitude/Demeanor/Rapport: Engaged Affect (typically observed): Accepting Orientation: : Oriented to Self, Oriented to Place, Oriented to  Time, Oriented to Situation Alcohol / Substance Use: Not Applicable Psych Involvement: No (comment)  Admission diagnosis:  Interstitial lung disease (HCC) [J84.9] Acute on chronic respiratory failure (HCC) [J96.20] Acute on chronic respiratory failure with hypoxia (HCC) [J96.21] Acute exacerbation of idiopathic pulmonary fibrosis (HCC) [J84.112] Patient Active Problem List   Diagnosis Date Noted  . Interstitial lung disease (HCC)   . Palliative care by specialist   . Goals of care, counseling/discussion   . Advanced directive placed in chart this admission   . Shortness of breath   . DNR (do not resuscitate)   . DNI (do not intubate)   . Encounter for hospice care discussion   . Acute  on chronic respiratory failure with hypoxia (HCC) 03/28/2020  . Acute exacerbation of idiopathic pulmonary fibrosis (HCC) 03/28/2020  . GERD without esophagitis 03/28/2020  . Essential hypertension 03/28/2020  . Hypothyroidism 03/28/2020  . Coronary artery  disease of native artery of native heart with stable angina pectoris (HCC) 07/19/2017  . Mixed hyperlipidemia 03/18/2017  . Coronary artery calcification seen on CAT scan 10/08/2016  . Family history of early CAD 10/08/2016  . Idiopathic pulmonary fibrosis (HCC)    PCP:  Macy Mis, MD Pharmacy:   [facility] - [place], [place] - [address] [place] [address] Phone: [phone] Fax: [phone]  [facility] [address] [place], [place] - [address] [place] [address] Phone: [phone] Fax: [phone]   Readmission Risk Interventions No flowsheet data found.

## 2020-03-31 NOTE — Progress Notes (Signed)
Daily Progress Note   Patient Name: Douglas Lopez       Date: 03/31/2020 DOB: Aug 05, 1945  Age: 74 y.o. MRN#: [id number] Attending Physician: Darlin Drop, DO Primary Care Physician: Macy Mis, MD Admit Date: 03/28/2020  Reason for Consultation/Follow-up: Disposition, Establishing goals of care, Non pain symptom management, Pain control and Psychosocial/spiritual support  Subjective: Chart review performed. Received report from primary RN - no acute concerns - states patient is still requiring high flow [place] and desats with minimal movements.  Went to visit patient at bedside - wife/Douglas Lopez was present. Patient was lying in bed awake, alert, oriented, and able to participate in conversation. He denied any current pain. He endorses experiencing dyspnea with minimal movements. During visit, he was on 15L HF [place] with saturations ranging from 85-95% at rest. He does have an occasional cough.  Reviewed clinical update with patient and wife since I last saw him on Friday. The patient is still interested in being medically optimized before discharge. Cardiology and pulmonology have been consulted on his case and he was waiting to see them. After discussions with the hospitalist, he believes he will be discharged around Wednesday. He is still wanting home hospice services on discharge. His goal is to not be rehospitalized after leaving - he wants all his symptoms to be managed at home, so he can live as comfortable as he can for as long as he can there.   Discussed his symptoms including pain, dyspnea, and insomnia. Reviewed his current active medications - he feels his current medications are managing his symptoms - no changes made.  He and his wife have very open communication - the wife is willing to  support the patient in any way he needs.   Addressed all questions/concerns. Encouraged to call with questions/concerns. PMT card was provided.  Length of Stay: 3  Current Medications: Scheduled Meds:  . amLODipine  5 mg Oral Daily  . aspirin EC  81 mg Oral Daily  . clopidogrel  75 mg Oral Daily  . enoxaparin (LOVENOX) injection  40 mg Subcutaneous QHS  . isosorbide mononitrate  60 mg Oral Daily  . levothyroxine  50 mcg Oral Q0600  . melatonin  5 mg Oral QHS  . methylPREDNISolone (SOLU-MEDROL) injection  125 mg Intravenous Daily  . metoprolol tartrate  37.5 mg  Oral BID  . multivitamin with minerals  1 tablet Oral Daily  . pantoprazole  40 mg Oral Daily  . Pirfenidone  1 tablet Oral TID WC  . pravastatin  20 mg Oral Daily  . saccharomyces boulardii  250 mg Oral BID  . senna-docusate  2 tablet Oral BID  . traZODone  50 mg Oral QHS    Continuous Infusions: . ceFEPime (MAXIPIME) IV 2 g (03/31/20 0910)    PRN Meds: acetaminophen **OR** acetaminophen, fluticasone, guaiFENesin-codeine, ipratropium-albuterol, morphine, nitroGLYCERIN, ondansetron **OR** ondansetron (ZOFRAN) IV, polyethylene glycol  Physical Exam Vitals and nursing note reviewed.  Constitutional:      General: He is not in acute distress.    Appearance: He is ill-appearing.  Pulmonary:     Effort: No respiratory distress.     Comments: Easily short of breath with minimal exertion Skin:    General: Skin is warm and dry.  Neurological:     Mental Status: He is alert.     Motor: Weakness present.  Psychiatric:        Behavior: Behavior is cooperative.        Cognition and Memory: Cognition normal.             Vital Signs: BP 138/67 (BP Location: Left Arm)   Pulse 85   Temp 98.2 F (36.8 C) (Oral)   Resp 20   Ht 5\' 11"  (1.803 m)   Wt 120.3 kg Comment: scale A  SpO2 94%   BMI 36.99 kg/m  SpO2: SpO2: 94 % O2 Device: O2 Device: High Flow Nasal Cannula O2 Flow Rate: O2 Flow Rate (L/min): 11  L/min  Intake/output summary:   Intake/Output Summary (Last 24 hours) at 03/31/2020 1516 Last data filed at 03/31/2020 1500 Gross per 24 hour  Intake 332 ml  Output 2550 ml  Net -2218 ml   LBM: Last BM Date: 03/28/20 Baseline Weight: Weight: 120.3 kg Most recent weight: Weight: 120.3 kg (scale A)       Palliative Assessment/Data: PPS 50%    Flowsheet Rows     Most Recent Value  Intake Tab  Referral Department Hospitalist  Unit at Time of Referral ER  Palliative Care Primary Diagnosis Pulmonary  Date Notified 03/28/20  Palliative Care Type New Palliative care  Reason for referral Clarify Goals of Care  Date of Admission 03/28/20  Date first seen by Palliative Care 03/28/20  # of days Palliative referral response time 0 Day(s)  # of days IP prior to Palliative referral 0  Clinical Assessment  Psychosocial & Spiritual Assessment  Palliative Care Outcomes  Patient/Family meeting held? Yes  Who was at the meeting? patient  Palliative Care Outcomes Clarified goals of care, Counseled regarding hospice, Provided psychosocial or spiritual support, Other (Comment)  [treat the treatable, home with hospice]      Patient Active Problem List   Diagnosis Date Noted  . Interstitial lung disease (HCC)   . Palliative care by specialist   . Goals of care, counseling/discussion   . Advanced directive placed in chart this admission   . Shortness of breath   . DNR (do not resuscitate)   . DNI (do not intubate)   . Encounter for hospice care discussion   . Acute on chronic respiratory failure with hypoxia (HCC) 03/28/2020  . Acute exacerbation of idiopathic pulmonary fibrosis (HCC) 03/28/2020  . GERD without esophagitis 03/28/2020  . Essential hypertension 03/28/2020  . Hypothyroidism 03/28/2020  . Coronary artery disease of native artery of native heart with  stable angina pectoris (HCC) 07/19/2017  . Mixed hyperlipidemia 03/18/2017  . Coronary artery calcification seen on CAT scan  10/08/2016  . Family history of early CAD 10/08/2016  . Idiopathic pulmonary fibrosis [place])     Palliative Care Assessment & Plan   Patient Profile: 74 y.o. male  with past medical history of obstructive sleep apnea, idiopathic pulmonary fibrosis with requires chronic O2 use, GERD, hypothyroidism, coronary artery disease s/p cath with stent x3 in 2018, and hypertension admitted on 03/28/2020 with acute exacerbation of idiopathic pulmonary fibrosis and acute on chronic respiratory failure.  Assessment: Acute exacerbation of idiopathic pulmonary fibrosis Acute on chronic respiratory distress GERD Essential hypertension Hospice care discussion  Recommendations/Plan:  Continue to medically optimize, full scope  Continue DNR/DNI as previously documented  Patient wants to discharge with home hospice support - his goal is to not be rehospitalized. TOC consult was placed Friday and they were notified.  Pain, dyspnea, insomnia are currently managed per patient - no changes made to current medications  PMT will continue to follow peripherally. If there are any imminent needs please call the service directly  Goals of Care and Additional Recommendations:  Limitations on Scope of Treatment: Full Scope Treatment  Code Status:    Code Status Orders  (From admission, onward)         Start     Ordered   03/28/20 2040  Do not attempt resuscitation (DNR)  Continuous       Question Answer Comment  In the event of cardiac or respiratory ARREST Do not call a "code blue"   In the event of cardiac or respiratory ARREST Do not perform Intubation, CPR, defibrillation or ACLS   In the event of cardiac or respiratory ARREST Use medication by any route, position, wound care, and other measures to relive pain and suffering. May use oxygen, suction and manual treatment of airway obstruction as needed for comfort.      03/28/20 2039        Code Status History    Date Active Date Inactive Code  Status Order ID Comments User Context   03/28/2020 2020 03/28/2020 2039 DNR [id number]  Marinda Elk, MD ED   07/21/2018 [phone] 03/28/2020 1550 DNR [id number]  Daleen Bo I, RN Outpatient   11/05/2016 1014 11/06/2016 1717 Full Code [id number]  Corky Crafts, MD Inpatient   10/26/2016 1321 10/26/2016 2027 Full Code [id number]  Corky Crafts, MD Inpatient   Advance Care Planning Activity       Prognosis:   < 6 months  Discharge Planning:  Home with Hospice  Care plan was discussed with primary RN, Dr. Margo Aye, patient, wife, [facility]  Thank you for allowing the Palliative Medicine Team to assist in the care of this patient.   Total Time  25 minutes Prolonged Time Billed  no       Greater than 50%  of this time was spent counseling and coordinating care related to the above assessment and plan.  Haskel Khan, NP  Please contact Palliative Medicine Team phone at [phone] for questions and concerns.

## 2020-03-31 NOTE — Progress Notes (Signed)
Pt's 02 sat intermittently dropped 80s % on 10 L HFNC. Pt occasionally needs 15L HFNC for activity or eating in order to keep his 02 sat >90%.Lawson Radar, RN

## 2020-03-31 NOTE — Progress Notes (Signed)
PROGRESS NOTE  Douglas Lopez JSH:[id number] DOB: March 26, 1946 DOA: 03/28/2020 PCP: Macy Mis, MD  HPI/Recap of past 88 hours: 74 year old male with past medical history of idiopathic pulmonary fibrosis, hypertension, gastroesophageal reflux disease, hyperlipidemia, coronary artery disease status post cath with stent x 3 (11/2016), chronic respiratory failure (currently on 8-10LPM at rest at home), hypothyroidism who presents to [facility] emergency department from his pulmonologist office at [facility] due to progressively worsening shortness of breath.  For the past 3 months, his shortness of breath and oxygen requirements have gradually increased.  Patient was using 4 L of oxygen via nasal cannula in May, followed by 8 L in June.  Patient is now using 9 L of oxygen via nasal cannula as of early August.  Dyspnea is constant severe in intensity, worse with minimal exertion.  Patient denies any associated chest pain.  Noticed occasional bouts of severe hypoxia, as low as 60% with exertion.  Of note, patient is status post Moderna COVID-19 vaccination in February and March 2021.  Patient initially presented to see his pulmonologist Dr. Ileene Hutchinson on 03/28/20.  Due to observed respiratory distress and hypoxia in the pulmonology clinic the patient was sent to [facility] emergency department for evaluation and administration of high-dose steroids, antibiotic therapy and diuretics.  CT imaging of the chest was performed revealing mediastinal hilar adenopathy with bilateral groundglass opacities, particularly in the right upper lobe and right middle lobe.  Patient was placed on intravenous levofloxacin, intravenous Solu-Medrol and intravenous Lasix.  The hospitalist group was called to assess the patient for admission to the hospital.  Pulmonary consulted and following.  Cardiology was also consulted due to LV apical akinesis. Per cardiology, continue medical management.  03/31/20: Seen and  examined in his room.  On 15 L high flow nasal cannula in the room.  Reports quick drop of his oxygen saturation with minimal movement.  Assessment/Plan: Principal Problem:   Acute exacerbation of idiopathic pulmonary fibrosis (HCC) Active Problems:   Mixed hyperlipidemia   Coronary artery disease of native artery of native heart with stable angina pectoris (HCC)   Acute on chronic respiratory failure with hypoxia (HCC)   GERD without esophagitis   Essential hypertension   Hypothyroidism   Palliative care by specialist   Goals of care, counseling/discussion   Advanced directive placed in chart this admission   Shortness of breath   DNR (do not resuscitate)   DNI (do not intubate)   Encounter for hospice care discussion   Interstitial lung disease (HCC)   Acute exacerbation of idiopathic pulmonary fibrosis (HCC)  Patient presenting with several month history of progressive shortness of breath and dyspnea on exertion, symptoms have particularly worsened over the past few days.  CT imaging is seemingly consistent with progressive pulmonary fibrosis although a superimposed atypical infectious process or pulmonary edema is possible.  In review of Dr. Dennard Nip pulmonology note from 8/26, he recommends initiation of a combination of high-dose steroid therapy, intravenous diuretics and antibiotic therapy.   Levaquin switched to cefepime on 03/30/2020.   03/31/2020, switch to IV Solu-Medrol 125 mg daily, then later switch to prednisone 60 mg daily (04/01/20) and taper off outpatient.  Continue home Pirfenidone  Continue as needed bronchodilator therapy  Continue as needed antitussive therapy  Continue supplemental oxygen necessary to achieve oxygen saturations of between 88 and 92%.  Patient is typically on CPAP nightly, 16 cmH2O -  Seen by pulmonary, appreciate recommendations.    Acute on chronic respiratory failure with  Hypoxia (HCC)  Please see assessment and plan above    Coronary artery disease status post PCI in 2018 Denies any anginal symptoms 2D echo on 03/29/2020 showed ppere with overall preserved ejection fraction Seen by cardiology, will continue medical management.  Continue beta-blocker, statin and dual antiplatelets, aspirin and Plavix.   Essential hypertension  BP stable Continue home regimen amlodipine, metoprolol Continue to monitor vital signs   Hypothyroidism Continue home Synthroid    Mixed hyperlipidemia Continue home statin therapy  GERD without esophagitis Continue home regimen of PPI  Steroid-induced insomnia Melatonin nightly Trazodone nightly  Goals of Care DNR Will discharge with hospice care at home  Palliative care team following  [facility] assisting with home hospice services   Status is: Inpatient    Dispo: The patient is from:Home               Anticipated d/c is VH:QION              Anticipated d/c date is:04/02/20              Patient currently not clinically stable for dc due to ongoing management of acute exacerbation of severe pulmonary fibrosis.        Objective: Vitals:   03/31/20 0529 03/31/20 0536 03/31/20 0840 03/31/20 1227  BP: 128/79  121/71 138/67  Pulse: 72   85  Resp: 18 (!) 25 18 20   Temp: 98.4 F (36.9 C)  98 F (36.7 C) 98.2 F (36.8 C)  TempSrc: Oral  Oral Oral  SpO2: 90% (!) 85% 97% 94%  Weight:  120.3 kg    Height:        Intake/Output Summary (Last 24 hours) at 03/31/2020 1509 Last data filed at 03/31/2020 1200 Gross per 24 hour  Intake 232 ml  Output 2550 ml  Net -2318 ml   Filed Weights   03/30/20 1000 03/30/20 1100 03/31/20 0536  Weight: 120.3 kg 120.3 kg 120.3 kg    Exam:  . General: 74 y.o. year-old male pleasant well-developed well-nourished no acute stress.  Alert and oriented x3. . Cardiovascular: Regular rate and rhythm no rubs or gallops. 66 Respiratory: Diffuse rales bilaterally.  No wheezing noted. . Abdomen: Obese nontender normal bowel sounds  present.   . Musculoskeletal: No lower extremity edema bilaterally.   Marland Kitchen Psychiatry: Mood is appropriate for condition and setting.  Data Reviewed: CBC: Recent Labs  Lab 03/28/20 1644 03/28/20 2202 03/29/20 0332 03/30/20 0705 03/31/20 0258  WBC 9.0  --  8.9 17.6* 16.1*  NEUTROABS  --   --  7.7 14.5* 13.9*  HGB 11.8* 13.3 12.1* 12.7* 12.4*  HCT 36.7* 39.0 37.4* 38.0* 37.7*  MCV 97.9  --  97.7 95.5 95.7  PLT 287  --  296 344 314   Basic Metabolic Panel: Recent Labs  Lab 03/28/20 1644 03/28/20 2202 03/29/20 0332 03/30/20 0705 03/31/20 0258  NA 141 140 138 139 136  K 3.9 3.7 3.9 3.8 3.9  CL 104  --  99 96* 95*  CO2 25  --  27 28 28   GLUCOSE 149*  --  222* 182* 195*  BUN 15  --  19 37* 39*  CREATININE 1.05  --  1.22 1.38* 1.37*  CALCIUM 9.5  --  9.5 9.8 9.3  MG  --   --  1.8 2.2  --    GFR: Estimated Creatinine Clearance: 62.4 mL/min (A) (by C-G formula based on SCr of 1.37 mg/dL (H)). Liver Function Tests: Recent Labs  Lab 03/29/20 0332  AST 37  ALT 43  ALKPHOS 39  BILITOT 0.7  PROT 7.7  ALBUMIN 3.8   No results for input(s): LIPASE, AMYLASE in the last 168 hours. No results for input(s): AMMONIA in the last 168 hours. Coagulation Profile: No results for input(s): INR, PROTIME in the last 168 hours. Cardiac Enzymes: No results for input(s): CKTOTAL, CKMB, CKMBINDEX, TROPONINI in the last 168 hours. BNP (last 3 results) No results for input(s): PROBNP in the last 8760 hours. HbA1C: No results for input(s): HGBA1C in the last 72 hours. CBG: No results for input(s): GLUCAP in the last 168 hours. Lipid Profile: No results for input(s): CHOL, HDL, LDLCALC, TRIG, CHOLHDL, LDLDIRECT in the last 72 hours. Thyroid Function Tests: No results for input(s): TSH, T4TOTAL, FREET4, T3FREE, THYROIDAB in the last 72 hours. Anemia Panel: No results for input(s): VITAMINB12, FOLATE, FERRITIN, TIBC, IRON, RETICCTPCT in the last 72 hours. Urine analysis: No results found  for: COLORURINE, APPEARANCEUR, LABSPEC, PHURINE, GLUCOSEU, HGBUR, BILIRUBINUR, KETONESUR, PROTEINUR, UROBILINOGEN, NITRITE, LEUKOCYTESUR Sepsis Labs: @LABRCNTIP (procalcitonin:4,lacticidven:4)  ) Recent Results (from the past 240 hour(s))  SARS Coronavirus 2 by RT PCR (hospital order, performed in [facility] hospital lab) Nasopharyngeal Nasopharyngeal Swab     Status: None   Collection Time: 03/28/20  4:12 PM   Specimen: Nasopharyngeal Swab  Result Value Ref Range Status   SARS Coronavirus 2 NEGATIVE NEGATIVE Final    Comment: (NOTE) SARS-CoV-2 target nucleic acids are NOT DETECTED.  The SARS-CoV-2 RNA is generally detectable in upper and lower respiratory specimens during the acute phase of infection. The lowest concentration of SARS-CoV-2 viral copies this assay can detect is 250 copies / mL. A negative result does not preclude SARS-CoV-2 infection and should not be used as the sole basis for treatment or other patient management decisions.  A negative result may occur with improper specimen collection / handling, submission of specimen other than nasopharyngeal swab, presence of viral mutation(s) within the areas targeted by this assay, and inadequate number of viral copies (<250 copies / mL). A negative result must be combined with clinical observations, patient history, and epidemiological information.  Fact Sheet for Patients:   03/30/20  Fact Sheet for Healthcare Providers: BoilerBrush.com.cy  This test is not yet approved or  cleared by the https://pope.com/ FDA and has been authorized for detection and/or diagnosis of SARS-CoV-2 by FDA under an Emergency Use Authorization (EUA).  This EUA will remain in effect (meaning this test can be used) for the duration of the COVID-19 declaration under Section 564(b)(1) of the Act, 21 U.S.C. section 360bbb-3(b)(1), unless the authorization is terminated or revoked  sooner.  Performed at [facility], [address], [place], [place]       Studies: No results found.  Scheduled Meds: . amLODipine  5 mg Oral Daily  . aspirin EC  81 mg Oral Daily  . clopidogrel  75 mg Oral Daily  . enoxaparin (LOVENOX) injection  40 mg Subcutaneous QHS  . isosorbide mononitrate  60 mg Oral Daily  . levothyroxine  50 mcg Oral Q0600  . melatonin  5 mg Oral QHS  . methylPREDNISolone (SOLU-MEDROL) injection  125 mg Intravenous Daily  . metoprolol tartrate  37.5 mg Oral BID  . multivitamin with minerals  1 tablet Oral Daily  . pantoprazole  40 mg Oral Daily  . Pirfenidone  1 tablet Oral TID WC  . pravastatin  20 mg Oral Daily  . saccharomyces boulardii  250 mg  Oral BID  . senna-docusate  2 tablet Oral BID  . traZODone  50 mg Oral QHS    Continuous Infusions: . ceFEPime (MAXIPIME) IV 2 g (03/31/20 0910)     LOS: 3 days     Darlin Droparole N Duvan Mousel, MD [facility] Pager [phone]  If 7PM-7AM, please contact night-coverage [email or web address] Password [email or web address] 03/31/2020, 3:09 PM

## 2020-03-31 NOTE — Consult Note (Addendum)
Cardiology Consultation:   Patient ID: Douglas Lopez MRN: [id number]; DOB: [birth year]  Admit date: 03/28/2020 Date of Consult: 03/31/2020  Primary Care Provider: Macy Mis, MD [facility] Cardiologist: Lance Muss, MD  [facility] Electrophysiologist:  None    Patient Profile:   Douglas Lopez is a 74 y.o. male with a hx of CAD s/p PCI to LAD and Cx 2018 with residual CAD in RCA, IPF, PVCs, GERD, HLD and hypothyroidism who is being seen today for the evaluation of new wall motion abnormality at the request of Dr. Margo Aye.  History of Present Illness:   Douglas Lopez has a history of idiopathic pulmonary fibrosis on chronic O2. He was found to have coronary calcifications on CT and had an abnormal nuclear stress test leading to heart cath in 2018. He was found to have 3 vessel disease and was referred for CABG. He ultimately declined CABG due to his pulmonary fibrosis. He had DES to midLAD, distal LAD, and LCx. He had residual CAD in distal RCA without intervention at that time. His anginal symptoms have been managed medically with ASA, plavix, amlodipine, imdur, and statin.   He presented to OP pulmonologist Dr. Ileene Hutchinson 03/28/20 with increased work of breathing and was sent to ER. He was placed on 10-15L O2, up from baseline of 8-9L, with some improvement in symptoms. CTA negative for PE and similar scarring from prior scans. He was started on steroids and diuresis. CAP coverage de-escalated. Morphine and palliative care in place. Echo obtained with new LV apex akinesis. HS troponin 14 --> 18. BNP 105.   On my interview, he reports worsening dyspnea and cough. No palpitations. He has mild lower extremity swelling that is unchanged from his baseline, improved in the morning, worse in the evening. He does report some mild chest tightness over the past 2-3 weeks, but this also coincided with coughing. He has not taken nitro. He also reports that this chest pain is not as severe as his  prior angina prior to PCI.    Past Medical History:  Diagnosis Date  . Arthritis    "joints" (11/05/2016)  . Bleeding stomach ulcer 1984  . Chronic bronchitis (HCC)   . Coronary artery disease    11/05/16 Cath with PCI and DES x1, Dist Crx, mLAD, dLAD  . High cholesterol   . History of blood transfusion 1984   "took 5 units for bleeding stomach ulcer"  . Hypertension   . Hypothyroidism   . Idiopathic pulmonary fibrosis (HCC)    dx'd 10/2015  . OSA on CPAP   . Pneumonia    "couple times in the last 5 years" (11/05/2016)  . Right bundle branch block     Past Surgical History:  Procedure Laterality Date  . CATARACT EXTRACTION W/ INTRAOCULAR LENS  IMPLANT, BILATERAL Bilateral 1994  . CORONARY ANGIOPLASTY WITH STENT PLACEMENT  11/05/2016  . CORONARY STENT INTERVENTION N/A 11/05/2016   Procedure: Coronary Stent Intervention;  Surgeon: Corky Crafts, MD;  Location: [facility] INVASIVE CV LAB;  Service: Cardiovascular;  Laterality: N/A;  . CYSTOSCOPY W/ STONE MANIPULATION  1994  . INGUINAL HERNIA REPAIR Left 1972  . KNEE ARTHROSCOPY Bilateral 1994  . RIGHT/LEFT HEART CATH AND CORONARY ANGIOGRAPHY N/A 10/26/2016   Procedure: Right/Left Heart Cath and Coronary Angiography;  Surgeon: Corky Crafts, MD;  Location: [facility] INVASIVE CV LAB;  Service: Cardiovascular;  Laterality: N/A;  . TONSILLECTOMY  1950s     Home Medications:  Prior to Admission medications   Medication Sig  Start Date End Date Taking? Authorizing Provider  amLODipine (NORVASC) 5 MG tablet Take 5 mg by mouth in the morning.  04/13/16  Yes [provider]  aspirin 81 MG tablet Take 81 mg by mouth in the morning.    Yes [provider]  clopidogrel (PLAVIX) 75 MG tablet Take 1 tablet (75 mg total) by mouth daily. Patient taking differently: Take 75 mg by mouth in the morning.  08/25/19  Yes Corky Crafts, MD  fluticasone [facility]) 50 MCG/ACT nasal spray Place 2 sprays into both nostrils daily as needed for  allergies or rhinitis.    Yes [provider]  isosorbide mononitrate (IMDUR) 60 MG 24 hr tablet TAKE 1 TABLET EVERY DAY Patient taking differently: Take 60 mg by mouth in the morning.  08/25/19  Yes Corky Crafts, MD  levothyroxine (SYNTHROID, LEVOTHROID) 50 MCG tablet Take 50 mcg by mouth daily before breakfast.   Yes [provider]  metoprolol tartrate (LOPRESSOR) 25 MG tablet Take 1 tablet (25 mg total) by mouth 2 (two) times daily. 08/25/19  Yes Corky Crafts, MD  Multiple Vitamins-Minerals (MULTIVITAMIN WITH MINERALS) tablet Take 1 tablet by mouth daily.   Yes [provider]  nitroGLYCERIN (NITROSTAT) 0.4 MG SL tablet DISSOLVE 1 TABLET UNDER THE TONGUE EVERY 5 MINUTES AS NEEDED FOR CHEST PAIN. PLEASE MAKE APPOINTMENT FOR DECEMBER Patient taking differently: Place 0.4 mg under the tongue every 5 (five) hours as needed for chest pain.  10/31/19  Yes Corky Crafts, MD  OXYGEN Inhale 10 L/min into the lungs continuous.   Yes [provider]  pantoprazole (PROTONIX) 40 MG tablet TAKE 1 TABLET EVERY DAY Patient taking differently: Take 40 mg by mouth daily before breakfast.  08/25/19  Yes Corky Crafts, MD  Pirfenidone 801 MG TABS Take 801 mg by mouth with breakfast, with lunch, and with evening meal.    Yes [provider]  pravastatin (PRAVACHOL) 20 MG tablet Take 20 mg by mouth daily.   Yes [provider]  PARoxetine (PAXIL) 10 MG tablet Take by mouth. Patient not taking: Reported on 03/28/2020 07/05/19   [provider]    Inpatient Medications: Scheduled Meds: . amLODipine  5 mg Oral Daily  . aspirin EC  81 mg Oral Daily  . clopidogrel  75 mg Oral Daily  . enoxaparin (LOVENOX) injection  40 mg Subcutaneous QHS  . isosorbide mononitrate  60 mg Oral Daily  . levothyroxine  50 mcg Oral Q0600  . melatonin  5 mg Oral QHS  . methylPREDNISolone (SOLU-MEDROL) injection  125 mg Intravenous Daily  .  metoprolol tartrate  37.5 mg Oral BID  . multivitamin with minerals  1 tablet Oral Daily  . pantoprazole  40 mg Oral Daily  . Pirfenidone  1 tablet Oral TID WC  . pravastatin  20 mg Oral Daily  . saccharomyces boulardii  250 mg Oral BID  . senna-docusate  2 tablet Oral BID  . traZODone  50 mg Oral QHS   Continuous Infusions: . ceFEPime (MAXIPIME) IV 2 g (03/31/20 0910)   PRN Meds: acetaminophen **OR** acetaminophen, fluticasone, guaiFENesin-codeine, ipratropium-albuterol, morphine, nitroGLYCERIN, ondansetron **OR** ondansetron (ZOFRAN) IV, polyethylene glycol  Allergies:   No Known Allergies  Social History:   Social History   Socioeconomic History  . Marital status: Married    Spouse name: Not on file  . Number of children: Not on file  . Years of education: Not on file  . Highest education level: Not  on file  Occupational History  . Not on file  Tobacco Use  . Smoking status: Never Smoker  . Smokeless tobacco: Never Used  Vaping Use  . Vaping Use: Never used  Substance and Sexual Activity  . Alcohol use: No  . Drug use: No  . Sexual activity: Yes  Other Topics Concern  . Not on file  Social History Narrative  . Not on file   Social Determinants of Health   Financial Resource Strain:   . Difficulty of Paying Living Expenses: Not on file  Food Insecurity:   . Worried About Programme researcher, broadcasting/film/video in the Last Year: Not on file  . Ran Out of Food in the Last Year: Not on file  Transportation Needs:   . Lack of Transportation (Medical): Not on file  . Lack of Transportation (Non-Medical): Not on file  Physical Activity:   . Days of Exercise per Week: Not on file  . Minutes of Exercise per Session: Not on file  Stress:   . Feeling of Stress : Not on file  Social Connections:   . Frequency of Communication with Friends and Family: Not on file  . Frequency of Social Gatherings with Friends and Family: Not on file  . Attends Religious Services: Not on file  . Active  Member of Clubs or Organizations: Not on file  . Attends Banker Meetings: Not on file  . Marital Status: Not on file  Intimate Partner Violence:   . Fear of Current or Ex-Partner: Not on file  . Emotionally Abused: Not on file  . Physically Abused: Not on file  . Sexually Abused: Not on file    Family History:    Family History  Problem Relation Age of Onset  . Heart disease Mother   . Heart disease Father      ROS:  Please see the history of present illness.   All other ROS reviewed and negative.     Physical Exam/Data:   Vitals:   03/31/20 0529 03/31/20 0536 03/31/20 0840 03/31/20 1227  BP: 128/79  121/71 138/67  Pulse: 72   85  Resp: 18 (!) Temp: 98.4 F (36.9 C)  98 F (36.7 C) 98.2 F (36.8 C)  TempSrc: Oral  Oral Oral  SpO2: 90% (!) 85% 97% 94%  Weight:  120.3 kg    Height:        Intake/Output Summary (Last 24 hours) at 03/31/2020 1313 Last data filed at 03/31/2020 1200 Gross per 24 hour  Intake 232 ml  Output 2550 ml  Net -2318 ml   Last 3 Weights 03/31/2020 03/30/2020 03/30/2020  Weight (lbs) 265 lb 3.4 oz 265 lb 3.4 oz 265 lb 3.4 oz  Weight (kg) 120.3 kg 120.3 kg 120.3 kg     Body mass index is 36.99 kg/m.  General:  NAD, frequent coughing HEENT: normal Neck: no JVD Vascular: No carotid bruits  Cardiac:  Irregular rhythm, regular rate Lungs:  Coarse sounds throughout  Abd: soft, nontender, no hepatomegaly  Ext: minimal edema Musculoskeletal:  No deformities, BUE and BLE strength normal and equal Skin: warm and dry  Neuro:  CNs 2-12 intact, no focal abnormalities noted Psych:  Normal affect   EKG:  The EKG was personally reviewed and demonstrates:  Sinus HR 90, LAFB, PVCs Telemetry:  Telemetry was personally reviewed and demonstrates:  Sinus rhythm in the 80-90s with frequent PVCs  Relevant CV Studies:  Echo 03/29/20: 1. Left ventricular ejection  fraction, by estimation, is 50 to 55%. The  left ventricle has low normal  function. The left ventricle demonstrates  regional wall motion abnormalities (see scoring diagram/findings for  description). Left ventricular diastolic  parameters are consistent with Grade I diastolic dysfunction (impaired  relaxation).  2. Right ventricular systolic function is normal. The right ventricular  size is normal.  3. The mitral valve is normal in structure. No evidence of mitral valve  regurgitation. No evidence of mitral stenosis.  4. The aortic valve is normal in structure. Aortic valve regurgitation is  not visualized. No aortic stenosis is present.  5. 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  6. The inferior vena cava is normal in size with greater than 50%  respiratory variability, suggesting right atrial pressure of 3 mmHg.    Right and left heart cath 10/26/16:  RPDA lesion, 90 %stenosed.  3rd RPLB lesion, 90 %stenosed.  Dist Cx lesion, 90 %stenosed.  Dist LAD lesion, 75 %stenosed.  Ost 1st Diag to 1st Diag lesion, 80 %stenosed.  Mid LAD lesion, 75 %stenosed.  Ost LAD to Prox LAD lesion, 25 %stenosed.  LM lesion, 25 %stenosed.  Ramus lesion, 75 %stenosed.  The left ventricular ejection fraction is 50-55% by visual estimate.  The left ventricular systolic function is normal.  LV end diastolic pressure is normal.  There is no aortic valve stenosis.  Normal PA pressures. PA sat 68%. Cardiac output 6.2 L/min. CI: 2.7.   Severe three vessel CAD.  Plan for CVTS referral.    Patient with severe disease in two areas of the LAD, large diagonal, mid circumflex and PDA, large PLB.  If he is not a candidate for cardiac surgery for some reason, could pursue percutaneous options for revascularization.   Coronary stent intervention 11/05/16:  Ost LAD to Prox LAD lesion, 25 %stenosed.  LM lesion, 25 %stenosed.  Dist Cx lesion, 90 %stenosed. A STENT PROMUS PREM MR 2.5X16 drug eluting stent was successfully  placed, postdilated to 2.8 mm.  Post intervention, there is a 0% residual stenosis.  Dist LAD lesion, 75 %stenosed. A STENT PROMUS PREM MR 3.0X20 drug eluting stent was successfully placed, post dilated to 3.5 mm.  Post intervention, there is a 0% residual stenosis.  Mid LAD lesion, 75 %stenosed. A STENT PROMUS PREM MR 3.0X20 drug eluting stent was successfully placed, postdilated to 3.5 mm.  Post intervention, there is a 0% residual stenosis.  Ost 1st Diag to 1st Diag lesion, 80 %stenosed. The lesion was treated with PTCA with a 2.0 balloon. The balloon would not advance to the more distal vessel so only the proximal lesion was treated.  Post intervention, there is a 10% residual stenosis.  RPDA lesion, 90 %stenosed. 3rd RPLB lesion, 90 %stenosed. Unable to get good guide support from the radial approach to have the wire cross the lesion.   Continue dual antiplatelet therapy for at least a year and clopidogrel likely be on that time. He'll need aggressive secondary prevention. We'll see how his symptoms are before considering intervention of the posterior descending and posterior lateral branches. Would have to try femoral approach since support from the radial and engaging the RCA is suboptimal. We'll watch him overnight with plans for discharge tomorrow.    Laboratory Data:  High Sensitivity Troponin:   Recent Labs  Lab 03/28/20 1644 03/28/20 1943  TROPONINIHS 14 18*     Chemistry Recent [employer] 03/29/20 [phone] 03/30/20 0705 03/31/20 0258  NA 138 139 136  K 3.9 3.8 3.9  CL 99 96* 95*  CO2 GLUCOSE 222* 182* 195*  BUN 19 37* 39*  CREATININE 1.22 1.38* 1.37*  CALCIUM 9.5 9.8 9.3  GFRNONAA 58* 50* 50*  GFRAA >60 58* 58*  ANIONGAP Recent Labs  Lab 03/29/20 0332  PROT 7.7  ALBUMIN 3.8  AST 37  ALT 43  ALKPHOS 39  BILITOT 0.7   Hematology Recent Labs  Lab 03/29/20 0332 03/30/20 0705 03/31/20 0258  WBC 8.9 17.6* 16.1*  RBC 3.83*  3.98* 3.94*  HGB 12.1* 12.7* 12.4*  HCT 37.4* 38.0* 37.7*  MCV 97.7 95.5 95.7  MCH 31.6 31.9 31.5  MCHC 32.4 33.4 32.9  RDW 13.8 13.8 13.8  PLT 296 344 314   BNP Recent Labs  Lab 03/28/20 1652  BNP 105.2*    DDimer No results for input(s): DDIMER in the last 168 hours.   Radiology/Studies:  DG Chest 2 View  Result Date: 03/28/2020 CLINICAL DATA:  Shortness of breath, cough. EXAM: CHEST - 2 VIEW COMPARISON:  None. FINDINGS: Mild cardiomegaly is noted. No pneumothorax is noted. Diffuse reticular densities are noted throughout both lungs, most prominently seen in the lung bases, concerning for chronic interstitial lung disease, although superimposed acute inflammation or edema cannot be excluded. No significant effusion is noted. Bony thorax is unremarkable. IMPRESSION: Diffuse reticular densities are noted throughout both lungs, most prominently seen in the lung bases, concerning for chronic interstitial lung disease, although superimposed acute inflammation or edema cannot be excluded. Electronically Signed   By: Lupita Raider M.D.   On: 03/28/2020 16:50   CT Angio Chest PE W and/or Wo Contrast  Result Date: 03/28/2020 CLINICAL DATA:  PE suspected, high prob Shortness of breath. EXAM: CT ANGIOGRAPHY CHEST WITH CONTRAST TECHNIQUE: Multidetector CT imaging of the chest was performed using the standard protocol during bolus administration of intravenous contrast. Multiplanar CT image reconstructions and MIPs were obtained to evaluate the vascular anatomy. CONTRAST:  75mL OMNIPAQUE IOHEXOL 350 MG/ML SOLN COMPARISON:  Radiograph earlier this day. No remote exams available for comparison. Report from high-resolution chest CT February 2017 at an outside institution, images not available. FINDINGS: Cardiovascular: There are no filling defects within the pulmonary arteries to suggest pulmonary embolus. Breathing motion and contrast bolus timing limits evaluation particularly at the lung bases.  Aortic atherosclerosis and tortuosity. No dissection. Conventional branching pattern from the aortic arch. Borderline cardiomegaly. There are coronary artery calcifications. No pericardial effusion. Mediastinum/Nodes: Shotty mediastinal adenopathy with multiple nodes measuring up to 11 mm most prominently involving the upper mediastinum. There are small bilateral hilar nodes. Small calcified left hilar nodes. No nodes larger than 15 mm short axis. There is a small hiatal hernia. No thyroid nodule. Lungs/Pleura: Subpleural reticulation with multifocal bronchiectasis, detailed evaluation is obscured by motion. There is a slight basilar predominant distribution. Geographic ground-glass opacity in the right upper lobe perihilar right middle lobe. No dominant pulmonary mass or consolidative airspace disease. No pleural effusion. Upper Abdomen: Hepatic steatosis.  No acute findings. Musculoskeletal: Multilevel osteophytes and degenerative change in the spine. There are no acute or suspicious osseous abnormalities. Review of the MIP images confirms the above findings. IMPRESSION: 1. No pulmonary embolus. 2. There is background interstitial lung disease with subpleural reticulation and bronchiectasis, also reported on prior exam. Geographic ground-glass opacity in the right upper lobe and perihilar right middle lobe. This may be related to interstitial lung disease  or a separate process, either infectious or inflammatory. Pulmonary edema is felt less likely given the symmetry. 3. Shotty mediastinal and hilar adenopathy, likely reactive. 4. Coronary artery calcifications. 5. Incidental hepatic steatosis. Aortic Atherosclerosis (ICD10-I70.0). Electronically Signed   By: Narda Rutherford M.D.   On: 03/28/2020 19:29   ECHOCARDIOGRAM COMPLETE  Result Date: 03/29/2020    ECHOCARDIOGRAM REPORT   Patient Name:   Douglas Lopez Date of Exam: 03/29/2020 Medical Rec #:  [id number]     Height:       71.0 in Accession #:    [id number]     Weight:       271.0 lb Date of Birth:  May 11, 1946     BSA:          2.399 m Patient Age:    74 years      BP:           148/89 mmHg Patient Gender: M             HR:           83 bpm. Exam Location:  Inpatient Procedure: 2D Echo and Intracardiac Opacification Agent Indications:    acute systolic  History:        Patient has no prior history of Echocardiogram examinations.                 CAD; Risk Factors:Hypertension and Dyslipidemia.  Sonographer:    [name] Referring Phys: [id number] Deno Lunger SHALHOU[place] IMPRESSIONS  1. Left ventricular ejection fraction, by estimation, is 50 to 55%. The left ventricle has low normal function. The left ventricle demonstrates regional wall motion abnormalities (see scoring diagram/findings for description). Left ventricular diastolic  parameters are consistent with Grade I diastolic dysfunction (impaired relaxation).  2. Right ventricular systolic function is normal. The right ventricular size is normal.  3. The mitral valve is normal in structure. No evidence of mitral valve regurgitation. No evidence of mitral stenosis.  4. The aortic valve is normal in structure. Aortic valve regurgitation is not visualized. No aortic stenosis is present.  5. 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  6. The inferior vena cava is normal in size with greater than 50% respiratory variability, suggesting right atrial pressure of 3 mmHg. FINDINGS  Left Ventricle: Left ventricular ejection fraction, by estimation, is 50 to 55%. The left ventricle has low normal function. The left ventricle demonstrates regional wall motion abnormalities. Definity contrast agent was given IV to delineate the left ventricular endocardial borders. The left ventricular internal cavity size was normal in size. There is no left ventricular hypertrophy. Left ventricular diastolic parameters are consistent with Grade I diastolic dysfunction (impaired relaxation).  LV Wall Scoring:  The apex is akinetic. Right Ventricle: The right ventricular size is normal. No increase in right ventricular wall thickness. Right ventricular systolic function is normal. Left Atrium: Left atrial size was normal in size. Right Atrium: Right atrial size was normal in size. Pericardium: There is no evidence of pericardial effusion. Mitral Valve: The mitral valve is normal in structure. Normal mobility of the mitral valve leaflets. No evidence of mitral valve regurgitation. No evidence of mitral valve stenosis. Tricuspid Valve: The tricuspid valve is normal in structure. Tricuspid valve regurgitation is not demonstrated. No evidence of tricuspid stenosis. Aortic Valve: The aortic valve is normal in structure. Aortic valve regurgitation is not visualized. No aortic stenosis is present. Pulmonic Valve: The pulmonic valve was normal in structure. Pulmonic valve regurgitation  is not visualized. No evidence of pulmonic stenosis. Aorta: The aortic root is normal in size and structure. 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 Venous: The inferior vena cava is normal in size with greater than 50% respiratory variability, suggesting right atrial pressure of 3 mmHg. IAS/Shunts: No atrial level shunt detected by color flow Doppler.  LEFT VENTRICLE PLAX 2D LVIDd:         5.60 cm  Diastology LVIDs:         3.50 cm  LV e' lateral:   10.60 cm/s LV PW:         1.10 cm  LV E/e' lateral: 5.1 LV IVS:        1.10 cm  LV e' medial:    6.42 cm/s LVOT diam:     2.10 cm  LV E/e' medial:  8.3 LV SV:         52 LV SV Index:   22 LVOT Area:     3.46 cm  RIGHT VENTRICLE RV S prime:     18.70 cm/s TAPSE (M-mode): 2.4 cm LEFT ATRIUM             Index LA diam:        4.30 cm 1.79 cm/m LA Vol (A2C):   50.0 ml 20.84 ml/m LA Vol (A4C):   68.7 ml 28.63 ml/m LA Biplane Vol: 59.3 ml 24.72 ml/m  AORTIC VALVE LVOT Vmax:   97.90 cm/s LVOT Vmean:  65.500 cm/s LVOT VTI:    0.151 m  AORTA Ao Root diam: 4.00 cm Ao Asc  diam:  4.30 cm MV E velocity: 53.60 cm/s MV A velocity: 114.00 cm/s  SHUNTS MV E/A ratio:  0.47         Systemic VTI:  0.15 m                             Systemic Diam: 2.10 cm Donato SchultzMark Aella Ronda MD Electronically signed by Donato SchultzMark Hanley Woerner MD Signature Date/Time: 03/29/2020/12:29:59 PM    Final    {  Assessment and Plan:   New LV apex akinesis  Grade 1 DD CAD  - pt with 3v disease in 2018, declined CABG given IPF - last coronary intervention in 2018 with DES to distal Cx, mid LAD, and distal LAD - 90% RPDA stenosis, 90% 3rd RPLB stenosis - unable to get good guide support from radial approach, treated medically - no chest pain this admission, but echo with LV apex akinesis - hs troponin  14 --> 18 - BNP 105 - CTA negative for PE - given his overall disease, will opt to treat medically - unclear etiology of LV apex akinesis - continue on ASA, plavix, BB, imdur, and norvasc   Frequent PVCs - could be contributing to LV changes - BB can be increased gently to 37.5 mg BID   Chronic dyspnea Chronic respiratory failure IPF exacerbation - per primary  - on ABX      For questions or updates, please contact [facility] Please consult [email or web address] for contact info under    Signed, Marcelino Dusterngela Nicole [name], PA  03/31/2020 641:2513 PM  74 year old with multivessel coronary artery disease, pulmonary fibrosis having undergone pulmonary rehabilitation, high flow oxygen here with worsening shortness of breath.  [facility]  Recently desatted quite significantly with 18 L of oxygen during 6-minute walk at 4 minutes.  We were asked to see him  because of apical akinesis noted on most recent echocardiogram.  Left ventricle does appear to have a small portion of apical akinesis which clinically should not change any of our methods of care.  Overall he states that since Dr. Eldridge Dace performed to the multivessel PCI, he has not had any significant anginal symptoms.  Currently in bed,  occasional cough.  Wife at bedside.  We did make the connection that his daughter-in-law taught my daughter fourth grade.  Telemetry-frequent PVCs also chronic for him.  On exam comfortable in bed, fine crackles heard with inspiration, heart regular rate and rhythm with frequent ectopy, nonfocal neurologically  Labs reviewed as above.  Echocardiograms reviewed.  Assessment and plan  LV apical akinesis with overall preserved ejection fraction -Continue with current therapy.  This should not alter his clinical course.  Frequent PVCs -Increasing his metoprolol to 37.5 twice daily  Coronary artery disease multivessel -No angina currently.  On isosorbide, amlodipine, beta-blocker, aspirin and Plavix.  No indication currently for reattempt RCA angioplasty/PCI.  Chronic dyspnea with pulmonary fibrosis -His cardiac status is likely playing some role in these symptoms however the majority is his underlying pulmonary fibrosis.  Continue with antibiotics inhalers etc.  Donato Schultz, MD

## 2020-04-01 DIAGNOSIS — I493 Ventricular premature depolarization: Secondary | ICD-10-CM

## 2020-04-01 DIAGNOSIS — I25118 Atherosclerotic heart disease of native coronary artery with other forms of angina pectoris: Secondary | ICD-10-CM

## 2020-04-01 LAB — CBC WITH DIFFERENTIAL/PLATELET
Abs Immature Granulocytes: 0.14 10*3/uL — ABNORMAL HIGH (ref 0.00–0.07)
Basophils Absolute: 0 10*3/uL (ref 0.0–0.1)
Basophils Relative: 0 %
Eosinophils Absolute: 0 10*3/uL (ref 0.0–0.5)
Eosinophils Relative: 0 %
HCT: 35.9 % — ABNORMAL LOW (ref 39.0–52.0)
Hemoglobin: 12 g/dL — ABNORMAL LOW (ref 13.0–17.0)
Immature Granulocytes: 1 %
Lymphocytes Relative: 11 %
Lymphs Abs: 1.6 10*3/uL (ref 0.7–4.0)
MCH: 31.8 pg (ref 26.0–34.0)
MCHC: 33.4 g/dL (ref 30.0–36.0)
MCV: 95.2 fL (ref 80.0–100.0)
Monocytes Absolute: 1.8 10*3/uL — ABNORMAL HIGH (ref 0.1–1.0)
Monocytes Relative: 13 %
Neutro Abs: 11 10*3/uL — ABNORMAL HIGH (ref 1.7–7.7)
Neutrophils Relative %: 75 %
Platelets: 301 10*3/uL (ref 150–400)
RBC: 3.77 MIL/uL — ABNORMAL LOW (ref 4.22–5.81)
RDW: 13.6 % (ref 11.5–15.5)
WBC: 14.6 10*3/uL — ABNORMAL HIGH (ref 4.0–10.5)
nRBC: 0 % (ref 0.0–0.2)

## 2020-04-01 LAB — BASIC METABOLIC PANEL
Anion gap: 12 (ref 5–15)
BUN: 41 mg/dL — ABNORMAL HIGH (ref 8–23)
CO2: 30 mmol/L (ref 22–32)
Calcium: 9.4 mg/dL (ref 8.9–10.3)
Chloride: 95 mmol/L — ABNORMAL LOW (ref 98–111)
Creatinine, Ser: 1.28 mg/dL — ABNORMAL HIGH (ref 0.61–1.24)
GFR calc Af Amer: >60 mL/min (ref >60)
GFR calc non Af Amer: 55 mL/min — ABNORMAL LOW (ref >60)
Glucose, Bld: 158 mg/dL — ABNORMAL HIGH (ref 70–99)
Potassium: 3.8 mmol/L (ref 3.5–5.1)
Sodium: 137 mmol/L (ref 135–145)

## 2020-04-01 MED ORDER — PREDNISONE 20 MG PO TABS
60.0000 mg | ORAL_TABLET | Freq: Every day | ORAL | Status: DC
  Administered 2020-04-01 – 2020-04-02 (×2): 60 mg via ORAL
  Filled 2020-04-01 (×2): qty 3

## 2020-04-01 MED ORDER — SACCHAROMYCES BOULARDII 250 MG PO CAPS
250.0000 mg | ORAL_CAPSULE | Freq: Two times a day (BID) | ORAL | Status: DC
  Administered 2020-04-01 – 2020-04-02 (×2): 250 mg via ORAL
  Filled 2020-04-01 (×2): qty 1

## 2020-04-01 MED ORDER — SACCHAROMYCES BOULARDII 250 MG PO CAPS: 250.0000 mg | ORAL_CAPSULE | Freq: Two times a day (BID) | ORAL | Status: DC

## 2020-04-01 MED ORDER — LEVOFLOXACIN 750 MG PO TABS
750.0000 mg | ORAL_TABLET | Freq: Every day | ORAL | Status: DC
  Administered 2020-04-01: 750 mg via ORAL
  Filled 2020-04-01 (×2): qty 1

## 2020-04-01 MED ORDER — FUROSEMIDE 10 MG/ML IJ SOLN: 20.0000 mg | Freq: Two times a day (BID) | INTRAMUSCULAR | Status: DC

## 2020-04-01 MED ORDER — FUROSEMIDE 40 MG PO TABS
40.0000 mg | ORAL_TABLET | Freq: Every day | ORAL | Status: AC
  Administered 2020-04-01 – 2020-04-02 (×2): 40 mg via ORAL
  Filled 2020-04-01 (×2): qty 1

## 2020-04-01 NOTE — Progress Notes (Signed)
PCCM Progress Note  Subjective: Has frequent coughing spells.  Vital sign: BP (!) 156/60 (BP Location: Left Arm)   Pulse 79   Temp 97.8 F (36.6 C) (Oral)   Resp 20   Ht _0  (1.803 m)   Wt 120.3 kg Comment: scale A  SpO2 95%   BMI 36.99 kg/m   Physical exam:  General - alert Eyes - pupils reactive ENT - no sinus tenderness, no stridor Cardiac - regular rate/rhythm, no murmur Chest - b/l crackles Abdomen - soft, non tender, + bowel sounds Extremities - no cyanosis, clubbing, or edema Skin - no rashes Neuro - normal strength, moves extremities, follows commands Psych - normal mood and behavior  Assessment/plan:  Acute on chronic hypoxic respiratory failure from IPF exacerbation. - transitioned to prednisone 60 mg daily on 8/30 - plane for outpatient taper (60 mg x 7 days, 30 mg x 7 days, 20 mg x 7 days, then resume home 10 mg daily) - negative fluid balance as able - prn morphine for dyspnea - day 5 of ABx per primary team - continue pirfenidone for now - prn guaifenesin/codeine for cough  Goals of care. - DNR - met with palliative care team and has elected to transition to home hospice in [place] that he could cancel consult visit with Dr. Shearon Stalls that was scheduled for 04/25/20.  PCCM will sign off.  Call if help needed with transitioning from inpatient setting to home hospice.  Updated his wife over the phone.  Douglas Mires, MD [place] Pager - [phone] 04/01/2020, 8:43 AM

## 2020-04-01 NOTE — Progress Notes (Signed)
   04/01/20 1634  Clinical Encounter Type  Visited With Patient  Visit Type Initial;Spiritual support  Referral From Palliative care team  Consult/Referral To Chaplain  Spiritual Encounters  Spiritual Needs Prayer  This chaplain followed up with PMT consult for spiritual care.  The chaplain learned the Pt. wife went home to coordinate delivery of Hospice equipment.  The Pt. shared his faith has impacted his life decisions and will help the Pt. hold his transition home. The Pt. is looking forward to sharing time with his family at home and adding to the memories.  The Pt. speaks of his love for his wife, son, and two grandchildren in his story telling.  The Pt. accepted the chaplain's invitation for prayer and F/U spiritual care.

## 2020-04-01 NOTE — Progress Notes (Signed)
Chaplain responded to PMT consult for spiritual care.  Pt. is involved in bedside procedure at time of visit.  Pt. invited the chaplain to return later when his wife is present.

## 2020-04-01 NOTE — Evaluation (Signed)
Physical Therapy Evaluation Patient Details Name: Douglas Lopez MRN: [id number] DOB: 10/19/1945 Today's Date: 04/01/2020   History of Present Illness  Patient is a 74 y/o male who presents from pulmonary office with SOB and cough. Chest CT- bil groundglass opacities in right upper lobe and middle lobe. Admitted with acute on chronic hypoxemic respiratory failure due to chronic IPF. PMH includes HTN, OSA on CPAP, Idiopathic pulmonary fibrosis, CAD, high cholesterol.  Clinical Impression  Patient presents with dyspnea at rest, generalized weakness, decreased activity tolerance and impaired mobility s/p above. Pt recently got back from [facility] so declined mobility due to drop in Sp02 and coughing spells. Pt desating to 82% on 15L 02 via HFNC at rest with talking. Spent session discussing recommendations for DME, home setup, environment, community access etc. Wife present. Answered all questions as able. Wife/pt wondering when hospice will be in touch. Pt will need bariatric 3in1 commode as well as shower chair with arms. Pt declines mobility/therapy services while in the hospital due to dyspnea/drop in 02 with minimal activity. Nursing getting pt on/off BSC. Discharge from therapy.    Follow Up Recommendations Other (comment) (home with hospice care)    Equipment Recommendations  Rolling walker with 5" wheels;3in1 (PT);Hospital bed;Other (comment) (bariatric 3in1, bari shower chair)    Recommendations for Other Services       Precautions / Restrictions Precautions Precautions: Fall;Other (comment) Precaution Comments: watch 02, 15L 02 viz HF [place] Restrictions Weight Bearing Restrictions: No      Mobility  Bed Mobility               General bed mobility comments: Declined mobility as pt just had gotten off BSC not too long ago; coughing spells with Sp02 dropping to 82% on 15L 02 via HF [place] at rest.  Transfers                    Ambulation/Gait                Stairs             Wheelchair Mobility    Modified Rankin (Stroke Patients Only)       Balance                                             Pertinent Vitals/Pain Pain Assessment: No/denies pain    Home Living Family/patient expects to be discharged to:: Private residence Living Arrangements: Spouse/significant other Available Help at Discharge: Family;Available 24 hours/day Type of Home: House Home Access: Level entry     Home Layout: Two level Home Equipment: Walker - 4 wheels;Hand held shower head      Prior Function Level of Independence: Independent with assistive device(s)         Comments: Uses rollator for community; wears 4-5L at rest and 8L for exertion back in may; has had increasing needs for 02. Does some cooking, walks independently in home. Gave up driving a few weeks ago.     Hand Dominance   Dominant Hand: Right    Extremity/Trunk Assessment   Upper Extremity Assessment Upper Extremity Assessment: Defer to OT evaluation    Lower Extremity Assessment Lower Extremity Assessment: Generalized weakness    Cervical / Trunk Assessment Cervical / Trunk Assessment: Normal  Communication   Communication: No difficulties  Cognition Arousal/Alertness: Awake/alert Behavior During Therapy: WFL for tasks assessed/performed  Overall Cognitive Status: Within Functional Limits for tasks assessed                                        General Comments General comments (skin integrity, edema, etc.): Sp02 dropped to 82% on 15L 02 via [place] at rest in bed with talking/coughing. Spent session discussing DME needs, home setup, environment and safety/mobility in home with wife present.    Exercises     Assessment/Plan    PT Assessment Patent does not need any further PT services  PT Problem List         PT Treatment Interventions      PT Goals (Current goals can be found in the Care Plan section)  Acute Rehab PT  Goals Patient Stated Goal: to go home on hospice PT Goal Formulation: All assessment and education complete, DC therapy    Frequency     Barriers to discharge        Co-evaluation               AM-PAC PT "6 Clicks" Mobility  Outcome Measure Help needed turning from your back to your side while in a flat bed without using bedrails?: A Little Help needed moving from lying on your back to sitting on the side of a flat bed without using bedrails?: A Little Help needed moving to and from a bed to a chair (including a wheelchair)?: A Little Help needed standing up from a chair using your arms (e.g., wheelchair or bedside chair)?: A Little Help needed to walk in hospital room?: A Little Help needed climbing 3-5 steps with a railing? : A Lot 6 Click Score: 17    End of Session Equipment Utilized During Treatment: Oxygen (15L 02 via HF [place]) Activity Tolerance: Treatment limited secondary to medical complications (Comment) (drop in Sp02 at rest, on 15L HF )     PT Visit Diagnosis: Muscle weakness (generalized) (M62.81);Difficulty in walking, not elsewhere classified (R26.2)    Time: 2778-2423 PT Time Calculation (min) (ACUTE ONLY): 24 min   Charges:   PT Evaluation $PT Eval Moderate Complexity: 1 Mod          Vale Haven, PT, DPT Acute Rehabilitation Services Pager [phone] Office [phone]      Blake Divine A Lanier Ensign 04/01/2020, 2:49 PM

## 2020-04-01 NOTE — Progress Notes (Signed)
Progress Note  Patient Name: Douglas Lopez Date of Encounter: 04/01/2020  [facility] Cardiologist: Lance Muss, MD   Subjective   Breathing stable, slight chest discomfort last night in the setting of not enough oxygen and laying down, symptom quickly improved after oxygen increased and he sat up.   Inpatient Medications    Scheduled Meds: . amLODipine  5 mg Oral Daily  . aspirin EC  81 mg Oral Daily  . clopidogrel  75 mg Oral Daily  . enoxaparin (LOVENOX) injection  40 mg Subcutaneous QHS  . isosorbide mononitrate  60 mg Oral Daily  . levothyroxine  50 mcg Oral Q0600  . melatonin  5 mg Oral QHS  . metoprolol tartrate  37.5 mg Oral BID  . multivitamin with minerals  1 tablet Oral Daily  . pantoprazole  40 mg Oral Daily  . Pirfenidone  1 tablet Oral TID WC  . pravastatin  20 mg Oral Daily  . predniSONE  60 mg Oral Q breakfast  . saccharomyces boulardii  250 mg Oral BID  . senna-docusate  2 tablet Oral BID  . traZODone  50 mg Oral QHS   Continuous Infusions: . ceFEPime (MAXIPIME) IV 2 g (03/31/20 2302)   PRN Meds: acetaminophen **OR** acetaminophen, fluticasone, guaiFENesin-codeine, ipratropium-albuterol, morphine, nitroGLYCERIN, ondansetron **OR** ondansetron (ZOFRAN) IV, polyethylene glycol   Vital Signs    Vitals:   03/31/20 2128 03/31/20 2341 04/01/20 0612 04/01/20 0842  BP: 140/77 119/65 (!) 156/60 (!) 147/65  Pulse: 84 84 79 88  Resp: 20 15 20 19   Temp: 97.9 F (36.6 C) 97.9 F (36.6 C) 97.8 F (36.6 C) 97.7 F (36.5 C)  TempSrc: Oral Oral Oral Oral  SpO2: 99% 97% 95% 92%  Weight:      Height:        Intake/Output Summary (Last 24 hours) at 04/01/2020 0925 Last data filed at 04/01/2020 0615 Gross per 24 hour  Intake 100 ml  Output 1500 ml  Net -1400 ml   Last 3 Weights 03/31/2020 03/30/2020 03/30/2020  Weight (lbs) 265 lb 3.4 oz 265 lb 3.4 oz 265 lb 3.4 oz  Weight (kg) 120.3 kg 120.3 kg 120.3 kg      Telemetry    NSR with PVCs -  Personally Reviewed  ECG    NSR with RBBB and PVCs - Personally Reviewed  Physical Exam   GEN: No acute distress.   Neck: No JVD Cardiac: RRR, no murmurs, rubs, or gallops.  Respiratory: markedly diminished breath with crackles at base. GI: Soft, nontender, non-distended  MS: No edema; No deformity. Neuro:  Nonfocal  Psych: Normal affect   Labs    High Sensitivity Troponin:   Recent Labs  Lab 03/28/20 1644 03/28/20 1943  TROPONINIHS 14 18*      Chemistry Recent Labs  Lab 03/29/20 0332 03/29/20 0332 03/30/20 0705 03/31/20 0258 04/01/20 0207  NA 138   < > 139 136 137  K 3.9   < > 3.8 3.9 3.8  CL 99   < > 96* 95* 95*  CO2 27   < > 28 28 30   GLUCOSE 222*   < > 182* 195* 158*  BUN 19   < > 37* 39* 41*  CREATININE 1.22   < > 1.38* 1.37* 1.28*  CALCIUM 9.5   < > 9.8 9.3 9.4  PROT 7.7  --   --   --   --   ALBUMIN 3.8  --   --   --   --  AST 37  --   --   --   --   ALT 43  --   --   --   --   ALKPHOS 39  --   --   --   --   BILITOT 0.7  --   --   --   --   GFRNONAA 58*   < > 50* 50* 55*  GFRAA >60   < > 58* 58* >60  ANIONGAP 12   < > 15 13 12    < > = values in this interval not displayed.     Hematology Recent Labs  Lab 03/30/20 0705 03/31/20 0258 04/01/20 0207  WBC 17.6* 16.1* 14.6*  RBC 3.98* 3.94* 3.77*  HGB 12.7* 12.4* 12.0*  HCT 38.0* 37.7* 35.9*  MCV 95.5 95.7 95.2  MCH 31.9 31.5 31.8  MCHC 33.4 32.9 33.4  RDW 13.8 13.8 13.6  PLT 344 314 301    BNP Recent Labs  Lab 03/28/20 1652  BNP 105.2*     DDimer No results for input(s): DDIMER in the last 168 hours.   Radiology    No results found.  Cardiac Studies    Cath 10/26/2016  RPDA lesion, 90 %stenosed.  3rd RPLB lesion, 90 %stenosed.  Dist Cx lesion, 90 %stenosed.  Dist LAD lesion, 75 %stenosed.  Ost 1st Diag to 1st Diag lesion, 80 %stenosed.  Mid LAD lesion, 75 %stenosed.  Ost LAD to Prox LAD lesion, 25 %stenosed.  LM lesion, 25 %stenosed.  [place] lesion, 75  %stenosed.  The left ventricular ejection fraction is 50-55% by visual estimate.  The left ventricular systolic function is normal.  LV end diastolic pressure is normal.  There is no aortic valve stenosis.  Normal PA pressures. PA sat 68%. Cardiac output 6.2 L/min. CI: 2.7.   Severe three vessel CAD.  Plan for CVTS referral.    Patient with severe disease in two areas of the LAD, large diagonal, mid circumflex and PDA, large PLB.  If he is not a candidate for cardiac surgery for some reason, could pursue percutaneous options for revascularization.   Cath 11/05/2016  Ost LAD to Prox LAD lesion, 25 %stenosed.  LM lesion, 25 %stenosed.  Dist Cx lesion, 90 %stenosed. A STENT PROMUS PREM MR 2.5X16 drug eluting stent was successfully placed, postdilated to 2.8 mm.  Post intervention, there is a 0% residual stenosis.  Dist LAD lesion, 75 %stenosed. A STENT PROMUS PREM MR 3.0X20 drug eluting stent was successfully placed, post dilated to 3.5 mm.  Post intervention, there is a 0% residual stenosis.  Mid LAD lesion, 75 %stenosed. A STENT PROMUS PREM MR 3.0X20 drug eluting stent was successfully placed, postdilated to 3.5 mm.  Post intervention, there is a 0% residual stenosis.  Ost 1st Diag to 1st Diag lesion, 80 %stenosed. The lesion was treated with PTCA with a 2.0 balloon. The balloon would not advance to the more distal vessel so only the proximal lesion was treated.  Post intervention, there is a 10% residual stenosis.  RPDA lesion, 90 %stenosed. 3rd RPLB lesion, 90 %stenosed. Unable to get good guide support from the radial approach to have the wire cross the lesion.   Continue dual antiplatelet therapy for at least a year and clopidogrel likely be on that time. He'll need aggressive secondary prevention. We'll see how his symptoms are before considering intervention of the posterior descending and posterior lateral branches. Would have to try femoral approach since support  from the radial and engaging  the RCA is suboptimal. We'll watch him overnight with plans for discharge tomorrow.   Echo 03/29/2020 1. Left ventricular ejection fraction, by estimation, is 50 to 55%. The  left ventricle has low normal function. The left ventricle demonstrates  regional wall motion abnormalities (see scoring diagram/findings for  description). Left ventricular diastolic  parameters are consistent with Grade I diastolic dysfunction (impaired  relaxation).  2. Right ventricular systolic function is normal. The right ventricular  size is normal.  3. The mitral valve is normal in structure. No evidence of mitral valve  regurgitation. No evidence of mitral stenosis.  4. The aortic valve is normal in structure. Aortic valve regurgitation is  not visualized. No aortic stenosis is present.  5. 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  6. The inferior vena cava is normal in size with greater than 50%  respiratory variability, suggesting right atrial pressure of 3 mmHg.   FINDINGS  Left Ventricle: Left ventricular ejection fraction, by estimation, is 50  to 55%. The left ventricle has low normal function. The left ventricle  demonstrates regional wall motion abnormalities. Definity contrast agent  was given IV to delineate the left  ventricular endocardial borders. The left ventricular internal cavity size  was normal in size. There is no left ventricular hypertrophy. Left  ventricular diastolic parameters are consistent with Grade I diastolic  dysfunction (impaired relaxation).   LV Wall Scoring:  The apex is akinetic.    Patient Profile     74 y.o. male with PMH of CAD s/p PCI to LAD and LCx in 2019, IPF, PVCs, HLD, and hypothyroidism who was send to the hospital by his pulmonologist for increased work of breathing. He was placed on 10-15L O2, whereas previous baseline O2 need was 8-9L. Echocardiogram showed new LV apex akinesis.  Cardiology consulted for abnormal echo finding.   Assessment & Plan    1. Abnormal echocardiogram  - Echo 03/29/2020 showed EF 50-55%, mild to moderate dilatation of ascending aorta, akinesis of LV apex  - CTA negative for PE. Given lack of significant chest pain, plan to treat medically.   - no further recommendation from cardiac perspective. Patient has been by palliative care and plan for home hospice.   2. Acute on chronic respiratory failure: related to IPF. Euvolemic on exam.   3. Frequent PVCs  - metoprolol increased to 37.5mg  BID for suppression of PVCs  4. CAD  - last cath 2018, declined CABG given IPF, underwent DES to distal LCx, mid LAD and distal LAD. 90% RPDA and 90% 3rd RPLB treated medically.  5. Pulmonary fibrosis  6. HLD   [facility] will sign off.   Medication Recommendations:  Metoprolol tartrate 37.5mg  BID, amlodipine 5mg  daily, Imdur 60mg  daily. ASA, plavix. Pravastatin 20mg  daily.  Other recommendations (labs, testing, etc):  None Follow up as an outpatient:  Managed by home hospice. Cardiology to see as needed.  For questions or updates, please contact [facility] Please consult [email or web address] for contact info under        Signed, , PA  04/01/2020, 9:25 AM

## 2020-04-01 NOTE — Progress Notes (Signed)
Lost patients IV access. Patient does not wish to have another IV placed. Notified MD. Honoring patients wishes.

## 2020-04-01 NOTE — Progress Notes (Signed)
PROGRESS NOTE  Douglas MiyamotoDennis Doom ZOX:[id number] DOB: 1945-10-01 DOA: 03/28/2020 PCP: [name], Kim K, MD  HPI/Recap of past 1824 hours: 74 year old male with past medical history of advanced idiopathic pulmonary fibrosis, hypertension, gastroesophageal reflux disease, hyperlipidemia, coronary artery disease status post cath with stent x 3 (11/2016), chronic hypoxic respiratory failure (currently on 8-10LPM at rest at home), hypothyroidism who presents to [facility] emergency department from his pulmonologist office at [facility] due to progressively worsening shortness of breath.  Status post Moderna COVID-19 vaccination in February and March 2021.  Patient initially presented to see his pulmonologist at [facility] Dr. [name] on 03/28/20.  Due to observed respiratory distress and hypoxia in the pulmonology clinic the patient was sent to [facility] emergency department for evaluation and administration of high-dose steroids, antibiotic therapy and diuretics.  States it was more convenient to come here because facility is closer to his wife.  CT imaging of the chest was performed revealing mediastinal hilar adenopathy with bilateral groundglass opacities, particularly in the right upper lobe and right middle lobe.  Patient was placed on intravenous levofloxacin, intravenous Solu-Medrol and intravenous Lasix.  The hospitalist group was called admit.  Pulmonary following.  Cardiology consulted due to LV apical akinesis. Per cardiology, continue medical management.  04/01/20: Seen and examined in his room.  On 13 L high flow nasal cannula in the room.  Reports persistent quick drop of his oxygen saturation with minimal exertion.  Would like to return home with hospice care services by Wednesday.  Assessment/Plan: Principal Problem:   Acute exacerbation of idiopathic pulmonary fibrosis (HCC) Active Problems:   Mixed hyperlipidemia   Coronary artery disease of native artery of native heart with stable  angina pectoris (HCC)   Acute on chronic respiratory failure with hypoxia (HCC)   GERD without esophagitis   Essential hypertension   Hypothyroidism   Palliative care by specialist   Goals of care, counseling/discussion   Advanced directive placed in chart this admission   Shortness of breath   DNR (do not resuscitate)   DNI (do not intubate)   Encounter for hospice care discussion   Interstitial lung disease (HCC)   PVC (premature ventricular contraction)   Acute exacerbation of idiopathic pulmonary fibrosis (HCC)  Patient presenting with several month history of progressive shortness of breath and dyspnea on exertion, symptoms have particularly worsened over the past few days prior to his presentation.  CT imaging is seemingly consistent with progressive pulmonary fibrosis although a superimposed atypical infectious process or pulmonary edema is possible.  In review of Dr. [name]'s pulmonology note from 8/26, he recommends initiation of a combination of high-dose steroid therapy, intravenous diuretics and antibiotic therapy.   Levaquin switched to cefepime on 03/30/2020.   03/31/2020, switched to IV Solu-Medrol 125 mg daily, now on prednisone 60 mg daily (04/01/20) taper as recommended by pulmonary.  Received IV diuresing  Continue home Pirfenidone  Continue as needed bronchodilator therapy  Continue as needed antitussive therapy  Continue supplemental oxygen necessary to achieve oxygen saturations of between 88 and 92%.  Patient is typically on CPAP nightly, 16 cmH2O -  Seen by pulmonary, appreciate recommendations.  Leukocytosis, unclear if reactive from high-dose of steroids versus active infective process Currently on cefepime WBC and neutrophil count are trending down Procalcitonin negative less than 0.10 Repeat CBC with differentials in the morning    Acute on chronic respiratory failure with Hypoxia (HCC)  Please see assessment and plan above   Coronary  artery disease status post  PCI in 2018 Denies any anginal symptoms 2D echo on 03/29/2020 showed overall preserved ejection fraction Seen by cardiology, recommends medical management.  Continue beta-blocker, statin and dual antiplatelets, aspirin and Plavix.   Essential hypertension  BP stable Continue home regimen amlodipine, metoprolol Continue to monitor vital signs   Hypothyroidism Continue home Synthroid    Mixed hyperlipidemia Continue home statin therapy  GERD without esophagitis Continue home regimen of PPI  Steroid-induced insomnia Melatonin nightly Trazodone nightly  Goals of Care DNR Will discharge with hospice care at home  Palliative care team following  [name] assisting with home hospice services   Status is: Inpatient    Dispo: The patient is from:Home               Anticipated d/c is FE:OFHQ              Anticipated d/c date is:04/02/20              Patient currently not clinically stable for dc due to ongoing management of acute exacerbation of severe pulmonary fibrosis.        Objective: Vitals:   03/31/20 2341 04/01/20 0612 04/01/20 0842 04/01/20 1137  BP: 119/65 (!) 156/60 (!) 147/65 130/76  Pulse: 84 79 88   Resp: 15 20 19  (!) 24  Temp: 97.9 F (36.6 C) 97.8 F (36.6 C) 97.7 F (36.5 C) (!) 97.4 F (36.3 C)  TempSrc: Oral Oral Oral Oral  SpO2: 97% 95% 92% 97%  Weight:      Height:        Intake/Output Summary (Last 24 hours) at 04/01/2020 1652 Last data filed at 04/01/2020 1140 Gross per 24 hour  Intake 360 ml  Output 950 ml  Net -590 ml   Filed Weights   03/30/20 1000 03/30/20 1100 03/31/20 0536  Weight: 120.3 kg 120.3 kg 120.3 kg    Exam:  . General: 74 y.o. year-old male pleasant in no acute distress.  Alert and oriented x3.   . Cardiovascular: Regular rate and rhythm no rubs or gallops. 66 Respiratory: Rales at bases no wheezing noted.   . Abdomen: Obese nontender normal bowel sounds present. . Musculoskeletal:  No lower extremity edema bilaterally.   Marland Kitchen Psychiatry: Mood is appropriate for condition and setting.   Data Reviewed: CBC: Recent Labs  Lab 03/28/20 1644 03/28/20 1644 03/28/20 2202 03/29/20 0332 03/30/20 0705 03/31/20 0258 04/01/20 0207  WBC 9.0  --   --  8.9 17.6* 16.1* 14.6*  NEUTROABS  --   --   --  7.7 14.5* 13.9* 11.0*  HGB 11.8*   < > 13.3 12.1* 12.7* 12.4* 12.0*  HCT 36.7*   < > 39.0 37.4* 38.0* 37.7* 35.9*  MCV 97.9  --   --  97.7 95.5 95.7 95.2  PLT 287  --   --  296 344 314 301   < > = values in this interval not displayed.   Basic Metabolic Panel: Recent Labs  Lab 03/28/20 1644 03/28/20 1644 03/28/20 2202 03/29/20 0332 03/30/20 0705 03/31/20 0258 04/01/20 0207  NA 141   < > 140 138 139 136 137  K 3.9   < > 3.7 3.9 3.8 3.9 3.8  CL 104  --   --  99 96* 95* 95*  CO2 25  --   --  27 28 28 30   GLUCOSE 149*  --   --  222* 182* 195* 158*  BUN 15  --   --  19 37* 39* 41*  CREATININE 1.05  --   --  1.22 1.38* 1.37* 1.28*  CALCIUM 9.5  --   --  9.5 9.8 9.3 9.4  MG  --   --   --  1.8 2.2  --   --    < > = values in this interval not displayed.   GFR: Estimated Creatinine Clearance: 66.8 mL/min (A) (by C-G formula based on SCr of 1.28 mg/dL (H)). Liver Function Tests: Recent Labs  Lab 03/29/20 0332  AST 37  ALT 43  ALKPHOS 39  BILITOT 0.7  PROT 7.7  ALBUMIN 3.8   No results for input(s): LIPASE, AMYLASE in the last 168 hours. No results for input(s): AMMONIA in the last 168 hours. Coagulation Profile: No results for input(s): INR, PROTIME in the last 168 hours. Cardiac Enzymes: No results for input(s): CKTOTAL, CKMB, CKMBINDEX, TROPONINI in the last 168 hours. BNP (last 3 results) No results for input(s): PROBNP in the last 8760 hours. HbA1C: No results for input(s): HGBA1C in the last 72 hours. CBG: No results for input(s): GLUCAP in the last 168 hours. Lipid Profile: No results for input(s): CHOL, HDL, LDLCALC, TRIG, CHOLHDL, LDLDIRECT in the  last 72 hours. Thyroid Function Tests: No results for input(s): TSH, T4TOTAL, FREET4, T3FREE, THYROIDAB in the last 72 hours. Anemia Panel: No results for input(s): VITAMINB12, FOLATE, FERRITIN, TIBC, IRON, RETICCTPCT in the last 72 hours. Urine analysis: No results found for: COLORURINE, APPEARANCEUR, LABSPEC, PHURINE, GLUCOSEU, HGBUR, BILIRUBINUR, KETONESUR, PROTEINUR, UROBILINOGEN, NITRITE, LEUKOCYTESUR Sepsis Labs: @LABRCNTIP (procalcitonin:4,lacticidven:4)  ) Recent Results (from the past 240 hour(s))  SARS Coronavirus 2 by RT PCR (hospital order, performed in [facility] hospital lab) Nasopharyngeal Nasopharyngeal Swab     Status: None   Collection Time: 03/28/20  4:12 PM   Specimen: Nasopharyngeal Swab  Result Value Ref Range Status   SARS Coronavirus 2 NEGATIVE NEGATIVE Final    Comment: (NOTE) SARS-CoV-2 target nucleic acids are NOT DETECTED.  The SARS-CoV-2 RNA is generally detectable in upper and lower respiratory specimens during the acute phase of infection. The lowest concentration of SARS-CoV-2 viral copies this assay can detect is 250 copies / mL. A negative result does not preclude SARS-CoV-2 infection and should not be used as the sole basis for treatment or other patient management decisions.  A negative result may occur with improper specimen collection / handling, submission of specimen other than nasopharyngeal swab, presence of viral mutation(s) within the areas targeted by this assay, and inadequate number of viral copies (<250 copies / mL). A negative result must be combined with clinical observations, patient history, and epidemiological information.  Fact Sheet for Patients:   03/30/20  Fact Sheet for Healthcare Providers: BoilerBrush.com.cy  This test is not yet approved or  cleared by the https://pope.com/ FDA and has been authorized for detection and/or diagnosis of SARS-CoV-2 by FDA under an  Emergency Use Authorization (EUA).  This EUA will remain in effect (meaning this test can be used) for the duration of the COVID-19 declaration under Section 564(b)(1) of the Act, 21 U.S.C. section 360bbb-3(b)(1), unless the authorization is terminated or revoked sooner.  Performed at [facility], [address], [place], [place]       Studies: No results found.  Scheduled Meds: . amLODipine  5 mg Oral Daily  . aspirin EC  81 mg Oral Daily  . clopidogrel  75 mg Oral Daily  . enoxaparin (LOVENOX) injection  40 mg Subcutaneous QHS  . isosorbide mononitrate  60  mg Oral Daily  . levothyroxine  50 mcg Oral Q0600  . melatonin  5 mg Oral QHS  . metoprolol tartrate  37.5 mg Oral BID  . multivitamin with minerals  1 tablet Oral Daily  . pantoprazole  40 mg Oral Daily  . Pirfenidone  1 tablet Oral TID WC  . pravastatin  20 mg Oral Daily  . predniSONE  60 mg Oral Q breakfast  . saccharomyces boulardii  250 mg Oral BID  . senna-docusate  2 tablet Oral BID  . traZODone  50 mg Oral QHS    Continuous Infusions: . ceFEPime (MAXIPIME) IV 2 g (04/01/20 1107)     LOS: 4 days     Darlin Drop, MD [facility] Pager [phone]  If 7PM-7AM, please contact night-coverage [email or web address] Password [email or web address] 04/01/2020, 4:52 PM

## 2020-04-01 NOTE — Progress Notes (Signed)
[facility]  Spoke with pt's wife over phone.. Reviewed chart and discussed with our MD Dr. Gwenyth Ober. She has approved pt for hospice care at home.  Wife in agreement to hospice care as well and confirms goals of comfort.  She will need oxygen now at 15 L with 2 10L concentrators to be delivered, hospital bed, and BSC. These have been ordered for delivery today.   Norm Parcel RN [phone]

## 2020-04-02 LAB — CBC WITH DIFFERENTIAL/PLATELET
Abs Immature Granulocytes: 0.16 10*3/uL — ABNORMAL HIGH (ref 0.00–0.07)
Basophils Absolute: 0 10*3/uL (ref 0.0–0.1)
Basophils Relative: 0 %
Eosinophils Absolute: 0 10*3/uL (ref 0.0–0.5)
Eosinophils Relative: 0 %
HCT: 38 % — ABNORMAL LOW (ref 39.0–52.0)
Hemoglobin: 12.4 g/dL — ABNORMAL LOW (ref 13.0–17.0)
Immature Granulocytes: 1 %
Lymphocytes Relative: 14 %
Lymphs Abs: 2.1 10*3/uL (ref 0.7–4.0)
MCH: 30.8 pg (ref 26.0–34.0)
MCHC: 32.6 g/dL (ref 30.0–36.0)
MCV: 94.3 fL (ref 80.0–100.0)
Monocytes Absolute: 1.8 10*3/uL — ABNORMAL HIGH (ref 0.1–1.0)
Monocytes Relative: 12 %
Neutro Abs: 10.9 10*3/uL — ABNORMAL HIGH (ref 1.7–7.7)
Neutrophils Relative %: 73 %
Platelets: 278 10*3/uL (ref 150–400)
RBC: 4.03 MIL/uL — ABNORMAL LOW (ref 4.22–5.81)
RDW: 13.4 % (ref 11.5–15.5)
WBC: 14.9 10*3/uL — ABNORMAL HIGH (ref 4.0–10.5)
nRBC: 0 % (ref 0.0–0.2)

## 2020-04-02 LAB — BASIC METABOLIC PANEL
Anion gap: 14 (ref 5–15)
BUN: 37 mg/dL — ABNORMAL HIGH (ref 8–23)
CO2: 29 mmol/L (ref 22–32)
Calcium: 9.4 mg/dL (ref 8.9–10.3)
Chloride: 96 mmol/L — ABNORMAL LOW (ref 98–111)
Creatinine, Ser: 1.16 mg/dL (ref 0.61–1.24)
GFR calc Af Amer: >60 mL/min (ref >60)
GFR calc non Af Amer: >60 mL/min (ref >60)
Glucose, Bld: 154 mg/dL — ABNORMAL HIGH (ref 70–99)
Potassium: 3.6 mmol/L (ref 3.5–5.1)
Sodium: 139 mmol/L (ref 135–145)

## 2020-04-02 MED ORDER — TRAZODONE HCL 50 MG PO TABS: 50.0000 mg | ORAL_TABLET | Freq: Every day | ORAL | 0 refills | Status: AC

## 2020-04-02 MED ORDER — METOPROLOL TARTRATE 37.5 MG PO TABS: 37.5000 mg | ORAL_TABLET | Freq: Two times a day (BID) | ORAL | 0 refills | Status: AC

## 2020-04-02 MED ORDER — MORPHINE SULFATE (CONCENTRATE) 20 MG/ML PO SOLN: 5.0000 mg | ORAL | 0 refills | Status: AC | PRN

## 2020-04-02 MED ORDER — SENNOSIDES 8.6 MG PO TABS: 2.0000 | ORAL_TABLET | Freq: Two times a day (BID) | ORAL | 0 refills | Status: AC

## 2020-04-02 MED ORDER — LORAZEPAM 0.5 MG PO TABS: 0.5000 mg | ORAL_TABLET | ORAL | 0 refills | Status: AC | PRN

## 2020-04-02 MED ORDER — PREDNISONE 10 MG PO TABS: ORAL_TABLET | ORAL | 0 refills | Status: AC

## 2020-04-02 MED ORDER — PROCHLORPERAZINE MALEATE 10 MG PO TABS: 10.0000 mg | ORAL_TABLET | ORAL | 0 refills | Status: AC | PRN

## 2020-04-02 MED ORDER — LEVOFLOXACIN 750 MG PO TABS: 750.0000 mg | ORAL_TABLET | Freq: Every day | ORAL | 0 refills | Status: AC

## 2020-04-02 MED FILL — traZODone HCL 50 MG TABS: 50 | 30 days supply | Qty: 30 | Fill #0

## 2020-04-02 MED FILL — levoFLOXacin 750 MG TABS: 750 | 1 days supply | Qty: 1 | Fill #0

## 2020-04-02 MED FILL — SENNA 8.6 MG TABS: 8.6 | 7 days supply | Qty: 28 | Fill #0

## 2020-04-02 MED FILL — LORazepam 0.5 MG TABS: 0.5 | 12 days supply | Qty: 42 | Fill #0

## 2020-04-02 MED FILL — predniSONE 10 MG TABS: 10 | 21 days supply | Qty: 77 | Fill #0

## 2020-04-02 MED FILL — MORPHINE SULF 100 MG/5 ML S: 100 | 7 days supply | Qty: 12 | Fill #0

## 2020-04-02 MED FILL — PROCHLORPERAZINE 10 MG TAB: 10 | 7 days supply | Qty: 42 | Fill #0

## 2020-04-02 MED FILL — METOPROLOL TARTRATE 25 MG T: 25 | 30 days supply | Qty: 90 | Fill #0

## 2020-04-02 NOTE — Discharge Summary (Signed)
Physician Discharge Summary  Yaron Grasse HEN:[id number] DOB: Jun 01, 1946 DOA: 03/28/2020  PCP: Macy Mis, MD  Admit date: 03/28/2020 Discharge date: 04/02/2020  Admitted From: Home Disposition:  Home with hospice  Discharge Condition: Stable CODE STATUS: DNR  Diet recommendation:  Diet Orders (From admission, onward)    Start     Ordered   04/02/20 0000  Diet - low sodium heart healthy        04/02/20 0844   03/29/20 1838  Diet Heart Room service appropriate? Yes with Assist; Fluid consistency: Thin  Diet effective now       Question Answer Comment  Room service appropriate? Yes with Assist   Fluid consistency: Thin      03/29/20 1837         Brief/Interim Summary: Douglas Lopez is a 74 year old male with past medical history of advanced idiopathic pulmonary fibrosis, hypertension, gastroesophageal reflux disease, hyperlipidemia, coronary artery disease status postcath with stent x 3 (11/2016),chronic hypoxic respiratory failure(currently on 8-10LPM at rest at home),hypothyroidism who presents to [facility] emergency department from his pulmonologist office at [facility] due to progressively worsening shortness of breath.  Status postModernaCOVID-19 vaccination in February and March2021.   Patient initially presented to see his pulmonologist at [facility] Dr. Ileene Hutchinson on 03/28/20. Due to observed respiratory distress and hypoxia in the pulmonology clinic the patient was sent to [facility] emergency department for evaluation and administration of high-dose steroids, antibiotic therapy and diuretics.  States it was more convenient to come here because facility is closer to his wife.  CT imaging of the chest was performed revealing mediastinal hilar adenopathy with bilateral groundglass opacities, particularly in the right upper lobe and right middle lobe.Patient was placed on intravenous levofloxacin, intravenous Solu-Medrol and intravenous Lasix. The hospitalist group  was called admit, cardiology as well as pulmonology were consulted. He made slow improvements in respiratory status. He and his family elected to enroll with home hospice for comfort.    Discharge Diagnoses:  Principal Problem:   Acute exacerbation of idiopathic pulmonary fibrosis (HCC) Active Problems:   Mixed hyperlipidemia   Coronary artery disease of native artery of native heart with stable angina pectoris (HCC)   Acute on chronic respiratory failure with hypoxia (HCC)   GERD without esophagitis   Essential hypertension   Hypothyroidism   Palliative care by specialist   Goals of care, counseling/discussion   Advanced directive placed in chart this admission   Shortness of breath   DNR (do not resuscitate)   DNI (do not intubate)   Encounter for hospice care discussion   Interstitial lung disease (HCC)   PVC (premature ventricular contraction) Leukocytosis  Sepsis ruled out     Discharge Instructions  Discharge Instructions    Diet - low sodium heart healthy   Complete by: As directed    Increase activity slowly   Complete by: As directed      Allergies as of 04/02/2020   No Known Allergies     Medication List    STOP taking these medications   PARoxetine 10 MG tablet Commonly known as: PAXIL     TAKE these medications   amLODipine 5 MG tablet Commonly known as: NORVASC Take 5 mg by mouth in the morning.   aspirin 81 MG tablet Take 81 mg by mouth in the morning.   clopidogrel 75 MG tablet Commonly known as: PLAVIX Take 1 tablet (75 mg total) by mouth daily. What changed: when to take this   fluticasone 50  MCG/ACT nasal spray Commonly known as: FLONASE Place 2 sprays into both nostrils daily as needed for allergies or rhinitis.   isosorbide mononitrate 60 MG 24 hr tablet Commonly known as: IMDUR TAKE 1 TABLET EVERY DAY What changed: when to take this   levofloxacin 750 MG tablet Commonly known as: Levaquin Take 1 tablet (750 mg total) by mouth  daily for 1 day.   levothyroxine 50 MCG tablet Commonly known as: SYNTHROID Take 50 mcg by mouth daily before breakfast.   LORazepam 0.5 MG tablet Commonly known as: ATIVAN Take 1 tablet (0.5 mg total) by mouth every 4 (four) hours as needed for anxiety. May crush, mix with water and give sublingually if needed.   Metoprolol Tartrate 37.5 MG Tabs Take 37.5 mg by mouth 2 (two) times daily. What changed:   medication strength  how much to take   morphine 20 MG/ML concentrated solution Commonly known as: ROXANOL Take 0.25 mLs (5 mg total) by mouth every 4 (four) hours as needed for shortness of breath.   multivitamin with minerals tablet Take 1 tablet by mouth daily.   nitroGLYCERIN 0.4 MG SL tablet Commonly known as: NITROSTAT DISSOLVE 1 TABLET UNDER THE TONGUE EVERY 5 MINUTES AS NEEDED FOR CHEST PAIN. PLEASE MAKE APPOINTMENT FOR DECEMBER What changed:   how much to take  how to take this  when to take this  reasons to take this  additional instructions   OXYGEN Inhale 10 L/min into the lungs continuous.   pantoprazole 40 MG tablet Commonly known as: PROTONIX TAKE 1 TABLET EVERY DAY What changed: when to take this   Pirfenidone 801 MG Tabs Take 801 mg by mouth with breakfast, with lunch, and with evening meal.   pravastatin 20 MG tablet Commonly known as: PRAVACHOL Take 20 mg by mouth daily.   predniSONE 10 MG tablet Commonly known as: DELTASONE Take 60 mg x 7 days, 30 mg x 7 days, 20 mg x 7 days, then resume home 10 mg daily   prochlorperazine 10 MG tablet Commonly known as: COMPAZINE Take 1 tablet (10 mg total) by mouth every 4 (four) hours as needed for nausea or vomiting. May crush, mix with water and give sublingually.   senna 8.6 MG tablet Commonly known as: Senokot Take 2 tablets (17.2 mg total) by mouth 2 (two) times daily. May crush, mix with water and give sublingually if needed.   traZODone 50 MG tablet Commonly known as: DESYREL Take 1  tablet (50 mg total) by mouth at bedtime.       Follow-up Information    [facility] Follow up.   Why: Registered Nurse- Office to call the wife with visit times.  Contact information: [address] [facility] [place] [phone]             No Known Allergies  Consultations:  Cardiology  PCCM  Palliative care medicine    Procedures/Studies: DG Chest 2 View  Result Date: 03/28/2020 CLINICAL DATA:  Shortness of breath, cough. EXAM: CHEST - 2 VIEW COMPARISON:  None. FINDINGS: Mild cardiomegaly is noted. No pneumothorax is noted. Diffuse reticular densities are noted throughout both lungs, most prominently seen in the lung bases, concerning for chronic interstitial lung disease, although superimposed acute inflammation or edema cannot be excluded. No significant effusion is noted. Bony thorax is unremarkable. IMPRESSION: Diffuse reticular densities are noted throughout both lungs, most prominently seen in the lung bases, concerning for chronic interstitial lung disease, although superimposed acute inflammation  or edema cannot be excluded. Electronically Signed   By: Lupita Raider M.D.   On: 03/28/2020 16:50   CT Angio Chest PE W and/or Wo Contrast  Result Date: 03/28/2020 CLINICAL DATA:  PE suspected, high prob Shortness of breath. EXAM: CT ANGIOGRAPHY CHEST WITH CONTRAST TECHNIQUE: Multidetector CT imaging of the chest was performed using the standard protocol during bolus administration of intravenous contrast. Multiplanar CT image reconstructions and MIPs were obtained to evaluate the vascular anatomy. CONTRAST:  75mL OMNIPAQUE IOHEXOL 350 MG/ML SOLN COMPARISON:  Radiograph earlier this day. No remote exams available for comparison. Report from high-resolution chest CT February 2017 at an outside institution, images not available. FINDINGS: Cardiovascular: There are no filling defects within the pulmonary arteries to suggest pulmonary embolus.  Breathing motion and contrast bolus timing limits evaluation particularly at the lung bases. Aortic atherosclerosis and tortuosity. No dissection. Conventional branching pattern from the aortic arch. Borderline cardiomegaly. There are coronary artery calcifications. No pericardial effusion. Mediastinum/Nodes: Shotty mediastinal adenopathy with multiple nodes measuring up to 11 mm most prominently involving the upper mediastinum. There are small bilateral hilar nodes. Small calcified left hilar nodes. No nodes larger than 15 mm short axis. There is a small hiatal hernia. No thyroid nodule. Lungs/Pleura: Subpleural reticulation with multifocal bronchiectasis, detailed evaluation is obscured by motion. There is a slight basilar predominant distribution. Geographic ground-glass opacity in the right upper lobe perihilar right middle lobe. No dominant pulmonary mass or consolidative airspace disease. No pleural effusion. Upper Abdomen: Hepatic steatosis.  No acute findings. Musculoskeletal: Multilevel osteophytes and degenerative change in the spine. There are no acute or suspicious osseous abnormalities. Review of the MIP images confirms the above findings. IMPRESSION: 1. No pulmonary embolus. 2. There is background interstitial lung disease with subpleural reticulation and bronchiectasis, also reported on prior exam. Geographic ground-glass opacity in the right upper lobe and perihilar right middle lobe. This may be related to interstitial lung disease or a separate process, either infectious or inflammatory. Pulmonary edema is felt less likely given the symmetry. 3. Shotty mediastinal and hilar adenopathy, likely reactive. 4. Coronary artery calcifications. 5. Incidental hepatic steatosis. Aortic Atherosclerosis (ICD10-I70.0). Electronically Signed   By: Narda Rutherford M.D.   On: 03/28/2020 19:29   ECHOCARDIOGRAM COMPLETE  Result Date: 03/29/2020    ECHOCARDIOGRAM REPORT   Patient Name:   RUDOLPH DAOUST Date of  Exam: 03/29/2020 Medical Rec #:  [id number]     Height:       71.0 in Accession #:    [id number]    Weight:       271.0 lb Date of Birth:  08/23/1945     BSA:          2.399 m Patient Age:    74 years      BP:           148/89 mmHg Patient Gender: M             HR:           83 bpm. Exam Location:  Inpatient Procedure: 2D Echo and Intracardiac Opacification Agent Indications:    acute systolic  History:        Patient has no prior history of Echocardiogram examinations.                 CAD; Risk Factors:Hypertension and Dyslipidemia.  Sonographer:    [name] Referring Phys: [id number] Deno Lunger [place]AL[place] IMPRESSIONS  1. Left ventricular ejection fraction, by estimation, is 50 to 55%.  The left ventricle has low normal function. The left ventricle demonstrates regional wall motion abnormalities (see scoring diagram/findings for description). Left ventricular diastolic  parameters are consistent with Grade I diastolic dysfunction (impaired relaxation).  2. Right ventricular systolic function is normal. The right ventricular size is normal.  3. The mitral valve is normal in structure. No evidence of mitral valve regurgitation. No evidence of mitral stenosis.  4. The aortic valve is normal in structure. Aortic valve regurgitation is not visualized. No aortic stenosis is present.  5. 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  6. The inferior vena cava is normal in size with greater than 50% respiratory variability, suggesting right atrial pressure of 3 mmHg. FINDINGS  Left Ventricle: Left ventricular ejection fraction, by estimation, is 50 to 55%. The left ventricle has low normal function. The left ventricle demonstrates regional wall motion abnormalities. Definity contrast agent was given IV to delineate the left ventricular endocardial borders. The left ventricular internal cavity size was normal in size. There is no left ventricular hypertrophy. Left ventricular diastolic  parameters are consistent with Grade I diastolic dysfunction (impaired relaxation).  LV Wall Scoring: The apex is akinetic. Right Ventricle: The right ventricular size is normal. No increase in right ventricular wall thickness. Right ventricular systolic function is normal. Left Atrium: Left atrial size was normal in size. Right Atrium: Right atrial size was normal in size. Pericardium: There is no evidence of pericardial effusion. Mitral Valve: The mitral valve is normal in structure. Normal mobility of the mitral valve leaflets. No evidence of mitral valve regurgitation. No evidence of mitral valve stenosis. Tricuspid Valve: The tricuspid valve is normal in structure. Tricuspid valve regurgitation is not demonstrated. No evidence of tricuspid stenosis. Aortic Valve: The aortic valve is normal in structure. Aortic valve regurgitation is not visualized. No aortic stenosis is present. Pulmonic Valve: The pulmonic valve was normal in structure. Pulmonic valve regurgitation is not visualized. No evidence of pulmonic stenosis. Aorta: The aortic root is normal in size and structure. 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 Venous: The inferior vena cava is normal in size with greater than 50% respiratory variability, suggesting right atrial pressure of 3 mmHg. IAS/Shunts: No atrial level shunt detected by color flow Doppler.  LEFT VENTRICLE PLAX 2D LVIDd:         5.60 cm  Diastology LVIDs:         3.50 cm  LV e' lateral:   10.60 cm/s LV PW:         1.10 cm  LV E/e' lateral: 5.1 LV IVS:        1.10 cm  LV e' medial:    6.42 cm/s LVOT diam:     2.10 cm  LV E/e' medial:  8.3 LV SV:         52 LV SV Index:   22 LVOT Area:     3.46 cm  RIGHT VENTRICLE RV S prime:     18.70 cm/s TAPSE (M-mode): 2.4 cm LEFT ATRIUM             Index LA diam:        4.30 cm 1.79 cm/m LA Vol (A2C):   50.0 ml 20.84 ml/m LA Vol (A4C):   68.7 ml 28.63 ml/m LA Biplane Vol: 59.3 ml 24.72 ml/m  AORTIC VALVE LVOT  Vmax:   97.90 cm/s LVOT Vmean:  65.500 cm/s LVOT VTI:    0.151 m  AORTA Ao Root diam: 4.00 cm Ao Asc diam:  4.30 cm MV E velocity: 53.60 cm/s MV A velocity: 114.00 cm/s  SHUNTS MV E/A ratio:  0.47         Systemic VTI:  0.15 m                             Systemic Diam: 2.10 cm Donato Schultz MD Electronically signed by Donato Schultz MD Signature Date/Time: 03/29/2020/12:29:59 PM    Final        Discharge Exam: Vitals:   04/02/20 0400 04/02/20 0813  BP: 128/87 136/84  Pulse: 77 86  Resp: 20   Temp: 97.6 F (36.4 C) 97.6 F (36.4 C)  SpO2: 93%     General: Pt is alert, awake, not in acute distress Cardiovascular: RRR, S1/S2 +, no edema Respiratory: +fine crackles throughout, no respiratory distress, no conversational dyspnea, on HFNC  Abdominal: Soft, NT, ND, bowel sounds + Extremities: no edema, no cyanosis Psych: Normal mood and affect, stable judgement and insight     The results of significant diagnostics from this hospitalization (including imaging, microbiology, ancillary and laboratory) are listed below for reference.     Microbiology: Recent Results (from the past 240 hour(s))  SARS Coronavirus 2 by RT PCR (hospital order, performed in [facility] hospital lab) Nasopharyngeal Nasopharyngeal Swab     Status: None   Collection Time: 03/28/20  4:12 PM   Specimen: Nasopharyngeal Swab  Result Value Ref Range Status   SARS Coronavirus 2 NEGATIVE NEGATIVE Final    Comment: (NOTE) SARS-CoV-2 target nucleic acids are NOT DETECTED.  The SARS-CoV-2 RNA is generally detectable in upper and lower respiratory specimens during the acute phase of infection. The lowest concentration of SARS-CoV-2 viral copies this assay can detect is 250 copies / mL. A negative result does not preclude SARS-CoV-2 infection and should not be used as the sole basis for treatment or other patient management decisions.  A negative result may occur with improper specimen collection / handling, submission of  specimen other than nasopharyngeal swab, presence of viral mutation(s) within the areas targeted by this assay, and inadequate number of viral copies (<250 copies / mL). A negative result must be combined with clinical observations, patient history, and epidemiological information.  Fact Sheet for Patients:   BoilerBrush.com.cy  Fact Sheet for Healthcare Providers: https://pope.com/  This test is not yet approved or  cleared by the [place] FDA and has been authorized for detection and/or diagnosis of SARS-CoV-2 by FDA under an Emergency Use Authorization (EUA).  This EUA will remain in effect (meaning this test can be used) for the duration of the COVID-19 declaration under Section 564(b)(1) of the Act, 21 U.S.C. section 360bbb-3(b)(1), unless the authorization is terminated or revoked sooner.  Performed at [facility], [address], [place], [place] [address]      Labs: BNP (last 3 results) Recent Labs    03/28/20 1652  BNP 105.2*   Basic Metabolic Panel: Recent Labs  Lab 03/29/20 0332 03/30/20 0705 03/31/20 0258 04/01/20 0207 04/02/20 0145  NA 138 139 136 137 139  K 3.9 3.8 3.9 3.8 3.6  CL 99 96* 95* 95* 96*  CO2 27 28 28 30 29   GLUCOSE 222* 182* 195* 158* 154*  BUN 19 37* 39* 41* 37*  CREATININE 1.22 1.38* 1.37* 1.28* 1.16  CALCIUM 9.5 9.8 9.3 9.4 9.4  MG 1.8 2.2  --   --   --  Liver Function Tests: Recent Labs  Lab 03/29/20 0332  AST 37  ALT 43  ALKPHOS 39  BILITOT 0.7  PROT 7.7  ALBUMIN 3.8   No results for input(s): LIPASE, AMYLASE in the last 168 hours. No results for input(s): AMMONIA in the last 168 hours. CBC: Recent Labs  Lab 03/29/20 0332 03/30/20 0705 03/31/20 0258 04/01/20 0207 04/02/20 0145  WBC 8.9 17.6* 16.1* 14.6* 14.9*  NEUTROABS 7.7 14.5* 13.9* 11.0* 10.9*  HGB 12.1* 12.7* 12.4* 12.0* 12.4*  HCT 37.4* 38.0* 37.7* 35.9* 38.0*  MCV 97.7 95.5 95.7 95.2 94.3  PLT  296 344 314 301 278   Cardiac Enzymes: No results for input(s): CKTOTAL, CKMB, CKMBINDEX, TROPONINI in the last 168 hours. BNP: Invalid input(s): POCBNP CBG: No results for input(s): GLUCAP in the last 168 hours. D-Dimer No results for input(s): DDIMER in the last 72 hours. Hgb A1c No results for input(s): HGBA1C in the last 72 hours. Lipid Profile No results for input(s): CHOL, HDL, LDLCALC, TRIG, CHOLHDL, LDLDIRECT in the last 72 hours. Thyroid function studies No results for input(s): TSH, T4TOTAL, T3FREE, THYROIDAB in the last 72 hours.  Invalid input(s): FREET3 Anemia work up No results for input(s): VITAMINB12, FOLATE, FERRITIN, TIBC, IRON, RETICCTPCT in the last 72 hours. Urinalysis No results found for: COLORURINE, APPEARANCEUR, LABSPEC, PHURINE, GLUCOSEU, HGBUR, BILIRUBINUR, KETONESUR, PROTEINUR, UROBILINOGEN, NITRITE, LEUKOCYTESUR Sepsis Labs Invalid input(s): PROCALCITONIN,  WBC,  LACTICIDVEN Microbiology Recent Results (from the past 240 hour(s))  SARS Coronavirus 2 by RT PCR (hospital order, performed in [facility] hospital lab) Nasopharyngeal Nasopharyngeal Swab     Status: None   Collection Time: 03/28/20  4:12 PM   Specimen: Nasopharyngeal Swab  Result Value Ref Range Status   SARS Coronavirus 2 NEGATIVE NEGATIVE Final    Comment: (NOTE) SARS-CoV-2 target nucleic acids are NOT DETECTED.  The SARS-CoV-2 RNA is generally detectable in upper and lower respiratory specimens during the acute phase of infection. The lowest concentration of SARS-CoV-2 viral copies this assay can detect is 250 copies / mL. A negative result does not preclude SARS-CoV-2 infection and should not be used as the sole basis for treatment or other patient management decisions.  A negative result may occur with improper specimen collection / handling, submission of specimen other than nasopharyngeal swab, presence of viral mutation(s) within the areas targeted by this assay, and inadequate  number of viral copies (<250 copies / mL). A negative result must be combined with clinical observations, patient history, and epidemiological information.  Fact Sheet for Patients:   BoilerBrush.com.cy  Fact Sheet for Healthcare Providers: https://pope.com/  This test is not yet approved or  cleared by the [place] FDA and has been authorized for detection and/or diagnosis of SARS-CoV-2 by FDA under an Emergency Use Authorization (EUA).  This EUA will remain in effect (meaning this test can be used) for the duration of the COVID-19 declaration under Section 564(b)(1) of the Act, 21 U.S.C. section 360bbb-3(b)(1), unless the authorization is terminated or revoked sooner.  Performed at [facility], [address] [place], [place] [address]      Patient was seen and examined on the day of discharge and was found to be in stable condition. Time coordinating discharge: 35 minutes including assessment and coordination of care, as well as examination of the patient.   SIGNED:  Noralee Stain, DO [facility] 04/02/2020, 8:48 AM

## 2020-04-02 NOTE — Progress Notes (Signed)
Discharge instructions provided to patient. Medications reviewed and all questions answered. No IV present. Patient received discharge medications from pharmacy and home medication. Patient to be escorted home by ambulance.

## 2020-04-02 NOTE — TOC Transition Note (Signed)
Transition of Care (TOC) - CM/SW Discharge Note Donn Pierini RN, BSN Transitions of Care Unit 4E- RN Case Manager See Treatment Team for direct phone #    Patient Details  Name: Douglas Lopez MRN: [id number] Date of Birth: [birth year]  Transition of Care [facility]) CM/SW Contact:  Darrold Span, RN Phone Number: 04/02/2020, 11:35 AM   Clinical Narrative:    Pt stable for transition home today with hospice services. Arrangements have been made with [facility]- per Cheri with Hospice DME to be delivered per [employer] this am by 11:00. Pt to transport via [facility] once DME has been confirmed delivered 1130- Call made to pt's wife at the home- DME has been delivered per wife-- she states that they are finishing setting everything up. [facility] called for transport and per them pt should be pickup within the hour.  Have updated Cheri with Hospice.   Transport paper work placed on shadow chart along with GOLD DNR and MOST form.    Final next level of care: Home w Hospice Care Barriers to Discharge: No Barriers Identified   Patient Goals and CMS Choice Patient states their goals for this hospitalization and ongoing recovery are:: to return home with hospice services. CMS Medicare.gov Compare Post Acute Care list provided to:: Patient Choice offered to / list presented to : Patient, Spouse  Discharge Placement                 Home with Hospice      Discharge Plan and Services In-house Referral: NA Discharge Planning Services: CM Consult Post Acute Care Choice: Hospice          DME Arranged: Hospice Equipment Package A DME Agency: [employer] Date DME Agency Contacted: 03/31/20 Time DME Agency Contacted: [phone] Representative spoke with at DME Agency: Pat with [facility]. HH Arranged: RN HH Agency: [facility] Date HH Agency Contacted: 03/31/20 Time HH Agency Contacted: [phone] Representative spoke with at [facility] Agency: Dennie Bible  Social Determinants of  Health (SDOH) Interventions     Readmission Risk Interventions Readmission Risk Prevention Plan 04/02/2020  Transportation Screening Complete  PCP or Specialist Appt within 5-7 Days Not Complete  Not Complete comments pt going home with hospice  Home Care Screening Complete  Medication Review (RN CM) Complete  Some recent data might be hidden

## 2020-04-25 ENCOUNTER — Institutional Professional Consult (permissible substitution): Payer: Medicare Other | Admitting: Internal Medicine

## 2020-05-12 ENCOUNTER — Emergency Department (HOSPITAL_COMMUNITY)

## 2020-05-12 ENCOUNTER — Emergency Department (HOSPITAL_COMMUNITY)
Admission: EM | Admit: 2020-05-12 | Discharge: 2020-05-12 | Disposition: A | Discharge status: Home / Self Care | Attending: Emergency Medicine | Admitting: Emergency Medicine

## 2020-05-12 ENCOUNTER — Other Ambulatory Visit: Payer: Self-pay

## 2020-05-12 ENCOUNTER — Encounter (HOSPITAL_COMMUNITY): Payer: Self-pay | Admitting: *Deleted

## 2020-05-12 DIAGNOSIS — S93601A Unspecified sprain of right foot, initial encounter: Secondary | ICD-10-CM

## 2020-05-12 DIAGNOSIS — Z955 Presence of coronary angioplasty implant and graft: Secondary | ICD-10-CM | POA: Insufficient documentation

## 2020-05-12 DIAGNOSIS — Z79899 Other long term (current) drug therapy: Secondary | ICD-10-CM | POA: Diagnosis not present

## 2020-05-12 DIAGNOSIS — W19XXXA Unspecified fall, initial encounter: Secondary | ICD-10-CM

## 2020-05-12 DIAGNOSIS — I10 Essential (primary) hypertension: Secondary | ICD-10-CM | POA: Diagnosis not present

## 2020-05-12 DIAGNOSIS — I2510 Atherosclerotic heart disease of native coronary artery without angina pectoris: Secondary | ICD-10-CM | POA: Insufficient documentation

## 2020-05-12 DIAGNOSIS — W1839XA Other fall on same level, initial encounter: Secondary | ICD-10-CM | POA: Insufficient documentation

## 2020-05-12 DIAGNOSIS — Z7982 Long term (current) use of aspirin: Secondary | ICD-10-CM | POA: Insufficient documentation

## 2020-05-12 DIAGNOSIS — S99921A Unspecified injury of right foot, initial encounter: Secondary | ICD-10-CM | POA: Diagnosis present

## 2020-05-12 DIAGNOSIS — E039 Hypothyroidism, unspecified: Secondary | ICD-10-CM | POA: Insufficient documentation

## 2020-05-12 NOTE — ED Triage Notes (Signed)
Pt BIB EMS and coming from home.  Pt was standing and fell to his knees.  Pt reports foot pain to the right foot.  Pt has had previous injuries to the foot before.  Pt is a hospice pt with idiopathic pulmonary fibrosis.  Pt took his nightly morphine and ativan prior to arrival to ED.  Pt is on 10L [place] but his nasal cannula convert O2 delivery to 15L equivalent. Pt a/o x 4.

## 2020-05-12 NOTE — ED Notes (Signed)
Pt in X ray

## 2020-05-12 NOTE — ED Notes (Signed)
Ambulance called for transport

## 2020-05-12 NOTE — Discharge Instructions (Addendum)
Use your wheelchair and walker to avoid putting weight on your foot.  Take your medications as needed for pain.

## 2020-05-12 NOTE — ED Provider Notes (Signed)
[facility]-EMERGENCY DEPT Provider Note   CSN: [id number] Arrival date & time: 05/12/20  2058     History Chief Complaint  Patient presents with  . Foot Pain    Celedonio MiyamotoDennis Westendorf is a 74 y.o. male.  HPI   Patient has history of idiopathic pulmonary fibrosis.  Patient is currently on home hospice.  Patient is on pain medications and has caused him to be confused at times.  Last evening apparently the patient had gotten out of bed and was on his knees being on the floor.  Patient states he has been having pain in his right foot and right knee since that fall.  He is denying any acute issues with his breathing.  No fevers or chills.  Past Medical History:  Diagnosis Date  . Arthritis    "joints" (11/05/2016)  . Bleeding stomach ulcer 1984  . Chronic bronchitis (HCC)   . Coronary artery disease    11/05/16 Cath with PCI and DES x1, Dist Crx, mLAD, dLAD  . High cholesterol   . History of blood transfusion 1984   "took 5 units for bleeding stomach ulcer"  . Hypertension   . Hypothyroidism   . Idiopathic pulmonary fibrosis (HCC)    dx'd 10/2015  . OSA on CPAP   . Pneumonia    "couple times in the last 5 years" (11/05/2016)  . Right bundle branch block     Patient Active Problem List   Diagnosis Date Noted  . PVC (premature ventricular contraction)   . Interstitial lung disease (HCC)   . Palliative care by specialist   . Goals of care, counseling/discussion   . Advanced directive placed in chart this admission   . Shortness of breath   . DNR (do not resuscitate)   . DNI (do not intubate)   . Encounter for hospice care discussion   . Acute on chronic respiratory failure with hypoxia (HCC) 03/28/2020  . Acute exacerbation of idiopathic pulmonary fibrosis (HCC) 03/28/2020  . GERD without esophagitis 03/28/2020  . Essential hypertension 03/28/2020  . Hypothyroidism 03/28/2020  . Coronary artery disease of native artery of native heart with stable angina  pectoris (HCC) 07/19/2017  . Mixed hyperlipidemia 03/18/2017  . Coronary artery calcification seen on CAT scan 10/08/2016  . Family history of early CAD 10/08/2016  . Idiopathic pulmonary fibrosis (HCC)     Past Surgical History:  Procedure Laterality Date  . CATARACT EXTRACTION W/ INTRAOCULAR LENS  IMPLANT, BILATERAL Bilateral 1994  . CORONARY ANGIOPLASTY WITH STENT PLACEMENT  11/05/2016  . CORONARY STENT INTERVENTION N/A 11/05/2016   Procedure: Coronary Stent Intervention;  Surgeon: Corky CraftsJayadeep S Varanasi, MD;  [facility];  Service: Cardiovascular;  Laterality: N/A;  . CYSTOSCOPY W/ STONE MANIPULATION  1994  . INGUINAL HERNIA REPAIR Left 1972  . KNEE ARTHROSCOPY Bilateral 1994  . RIGHT/LEFT HEART CATH AND CORONARY ANGIOGRAPHY N/A 10/26/2016   Procedure: Right/Left Heart Cath and Coronary Angiography;  Surgeon: Corky CraftsJayadeep S Varanasi, MD;  [facility];  Service: Cardiovascular;  Laterality: N/A;  . TONSILLECTOMY  1950s       Family History  Problem Relation Age of Onset  . Heart disease Mother   . Heart disease Father     Social History   Tobacco Use  . Smoking status: Never Smoker  . Smokeless tobacco: Never Used  Vaping Use  . Vaping Use: Never used  Substance Use Topics  . Alcohol use: No  . Drug use: No  Home Medications Prior to Admission medications   Medication Sig Start Date End Date Taking? Authorizing Provider  amLODipine (NORVASC) 5 MG tablet Take 5 mg by mouth in the morning.  04/13/16   [provider]  aspirin 81 MG tablet Take 81 mg by mouth in the morning.     [provider]  clopidogrel (PLAVIX) 75 MG tablet Take 1 tablet (75 mg total) by mouth daily. Patient taking differently: Take 75 mg by mouth in the morning.  08/25/19   Corky Crafts, MD  fluticasone [facility]) 50 MCG/ACT nasal spray Place 2 sprays into both nostrils daily as needed for allergies or rhinitis.     [provider]    isosorbide mononitrate (IMDUR) 60 MG 24 hr tablet TAKE 1 TABLET EVERY DAY Patient taking differently: Take 60 mg by mouth in the morning.  08/25/19   Corky Crafts, MD  levothyroxine (SYNTHROID, LEVOTHROID) 50 MCG tablet Take 50 mcg by mouth daily before breakfast.    [provider]  LORazepam (ATIVAN) 0.5 MG tablet Take 1 tablet (0.5 mg total) by mouth every 4 (four) hours as needed for anxiety. May crush, mix with water and give sublingually if needed. 04/02/20   Noralee Stain, DO  metoprolol tartrate 37.5 MG TABS Take 37.5 mg by mouth 2 (two) times daily. 04/02/20   Noralee Stain, DO  morphine (ROXANOL) 20 MG/ML concentrated solution Take 0.25 mLs (5 mg total) by mouth every 4 (four) hours as needed for shortness of breath. 04/02/20   Noralee Stain, DO  Multiple Vitamins-Minerals (MULTIVITAMIN WITH MINERALS) tablet Take 1 tablet by mouth daily.    [provider]  nitroGLYCERIN (NITROSTAT) 0.4 MG SL tablet DISSOLVE 1 TABLET UNDER THE TONGUE EVERY 5 MINUTES AS NEEDED FOR CHEST PAIN. PLEASE MAKE APPOINTMENT FOR DECEMBER Patient taking differently: Place 0.4 mg under the tongue every 5 (five) hours as needed for chest pain.  10/31/19   Corky Crafts, MD  OXYGEN Inhale 10 L/min into the lungs continuous.    [provider]  pantoprazole (PROTONIX) 40 MG tablet TAKE 1 TABLET EVERY DAY Patient taking differently: Take 40 mg by mouth daily before breakfast.  08/25/19   Corky Crafts, MD  Pirfenidone 801 MG TABS Take 801 mg by mouth with breakfast, with lunch, and with evening meal.     [provider]  pravastatin (PRAVACHOL) 20 MG tablet Take 20 mg by mouth daily.    [provider]  predniSONE (DELTASONE) 10 MG tablet Take 60 mg x 7 days, 30 mg x 7 days, 20 mg x 7 days, then resume home 10 mg daily 04/02/20   Noralee Stain, DO  prochlorperazine (COMPAZINE) 10 MG tablet Take 1 tablet (10 mg total) by mouth every 4 (four) hours as needed  for nausea or vomiting. May crush, mix with water and give sublingually. 04/02/20   Noralee Stain, DO  senna (SENOKOT) 8.6 MG tablet Take 2 tablets (17.2 mg total) by mouth 2 (two) times daily. May crush, mix with water and give sublingually if needed. 04/02/20   Noralee Stain, DO  traZODone (DESYREL) 50 MG tablet Take 1 tablet (50 mg total) by mouth at bedtime. 04/02/20   Noralee Stain, DO    Allergies    Patient has no known allergies.  Review of Systems   Review of Systems  All other systems reviewed and are negative.   Physical Exam Updated Vital Signs BP 126/76   Pulse 69   Temp 97.7 F (  36.5 C) (Oral)   Resp 12   SpO2 100%   Physical Exam Vitals and nursing note reviewed.  Constitutional:      Appearance: He is well-developed. He is not toxic-appearing or diaphoretic.  HENT:     Head: Normocephalic and atraumatic.     Right Ear: External ear normal.     Left Ear: External ear normal.  Eyes:     General: No scleral icterus.       Right eye: No discharge.        Left eye: No discharge.     Conjunctiva/sclera: Conjunctivae normal.  Neck:     Trachea: No tracheal deviation.  Cardiovascular:     Rate and Rhythm: Normal rate.  Pulmonary:     Effort: Pulmonary effort is normal. No respiratory distress.     Breath sounds: No stridor. No wheezing or rales.  Abdominal:     General: Bowel sounds are normal. There is no distension.     Palpations: Abdomen is soft.     Tenderness: There is no abdominal tenderness. There is no guarding or rebound.  Musculoskeletal:        General: Tenderness present.     Cervical back: Neck supple.     Comments: ttp medial right midfoot, mild erythema, no ankle ttp, mild ttp right knee, no effusion  Skin:    General: Skin is warm and dry.     Findings: No rash.  Neurological:     Mental Status: He is alert.     Cranial Nerves: No cranial nerve deficit (no facial droop, extraocular movements intact, no slurred speech).     Sensory: No  sensory deficit.     Motor: No abnormal muscle tone or seizure activity.     Coordination: Coordination normal.     ED Results / Procedures / Treatments   Labs (all labs ordered are listed, but only abnormal results are displayed) Labs Reviewed - No data to display  EKG None  Radiology DG Knee Complete 4 Views Right  Result Date: 05/12/2020 CLINICAL DATA:  Fall, right knee pain EXAM: RIGHT KNEE - COMPLETE 4+ VIEW COMPARISON:  None. FINDINGS: Four view radiograph right knee demonstrates normal alignment. No fracture or dislocation. Joint spaces are preserved. 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 No effusion. Minimal degenerative enthesopathy involving the insertion of the quadriceps tendon upon the patella. IMPRESSION: No acute fracture or dislocation. Electronically Signed   By: Helyn Numbers MD   On: 05/12/2020 21:42   DG Foot Complete Right  Result Date: 05/12/2020 CLINICAL DATA:  Right foot pain EXAM: RIGHT FOOT COMPLETE - 3+ VIEW COMPARISON:  None. FINDINGS: Three view radiograph of the right foot demonstrates normal alignment. No fracture or dislocation. Joint spaces are preserved. There is moderate soft tissue swelling of the right forefoot, particularly dorsally. IMPRESSION: Soft tissue swelling.  No fracture or dislocation. Electronically Signed   By: Helyn Numbers MD   On: 05/12/2020 21:41    Procedures Procedures (including critical care time)  Medications Ordered in ED Medications - No data to display  ED Course  I have reviewed the triage vital signs and the nursing notes.  Pertinent labs & imaging results that were available during my care of the patient were reviewed by me and considered in my medical decision making (see chart for details).    MDM Rules/Calculators/A&P  Xrays reviewed.  No sign of fracture or dislocation.  Pt has chronic pain meds at home.   He also has a walker and a  wheelchair.  Discussed findings with patient and family.  Comfortable with discharge.   Final Clinical Impression(s) / ED Diagnoses Final diagnoses:  Fall  Sprain of right foot, initial encounter    Rx / DC Orders ED Discharge Orders    None       Linwood Dibbles, MD 05/12/20 2219

## 2020-06-03 DEATH — deceased

## 2021-02-28 IMAGING — DX DG CHEST 2V
2 series · 2 of 2 positions shown · non-contrast
Comparison: None.

CLINICAL DATA: Shortness of breath, cough.

EXAM:
CHEST - 2 VIEW

[chest pa]
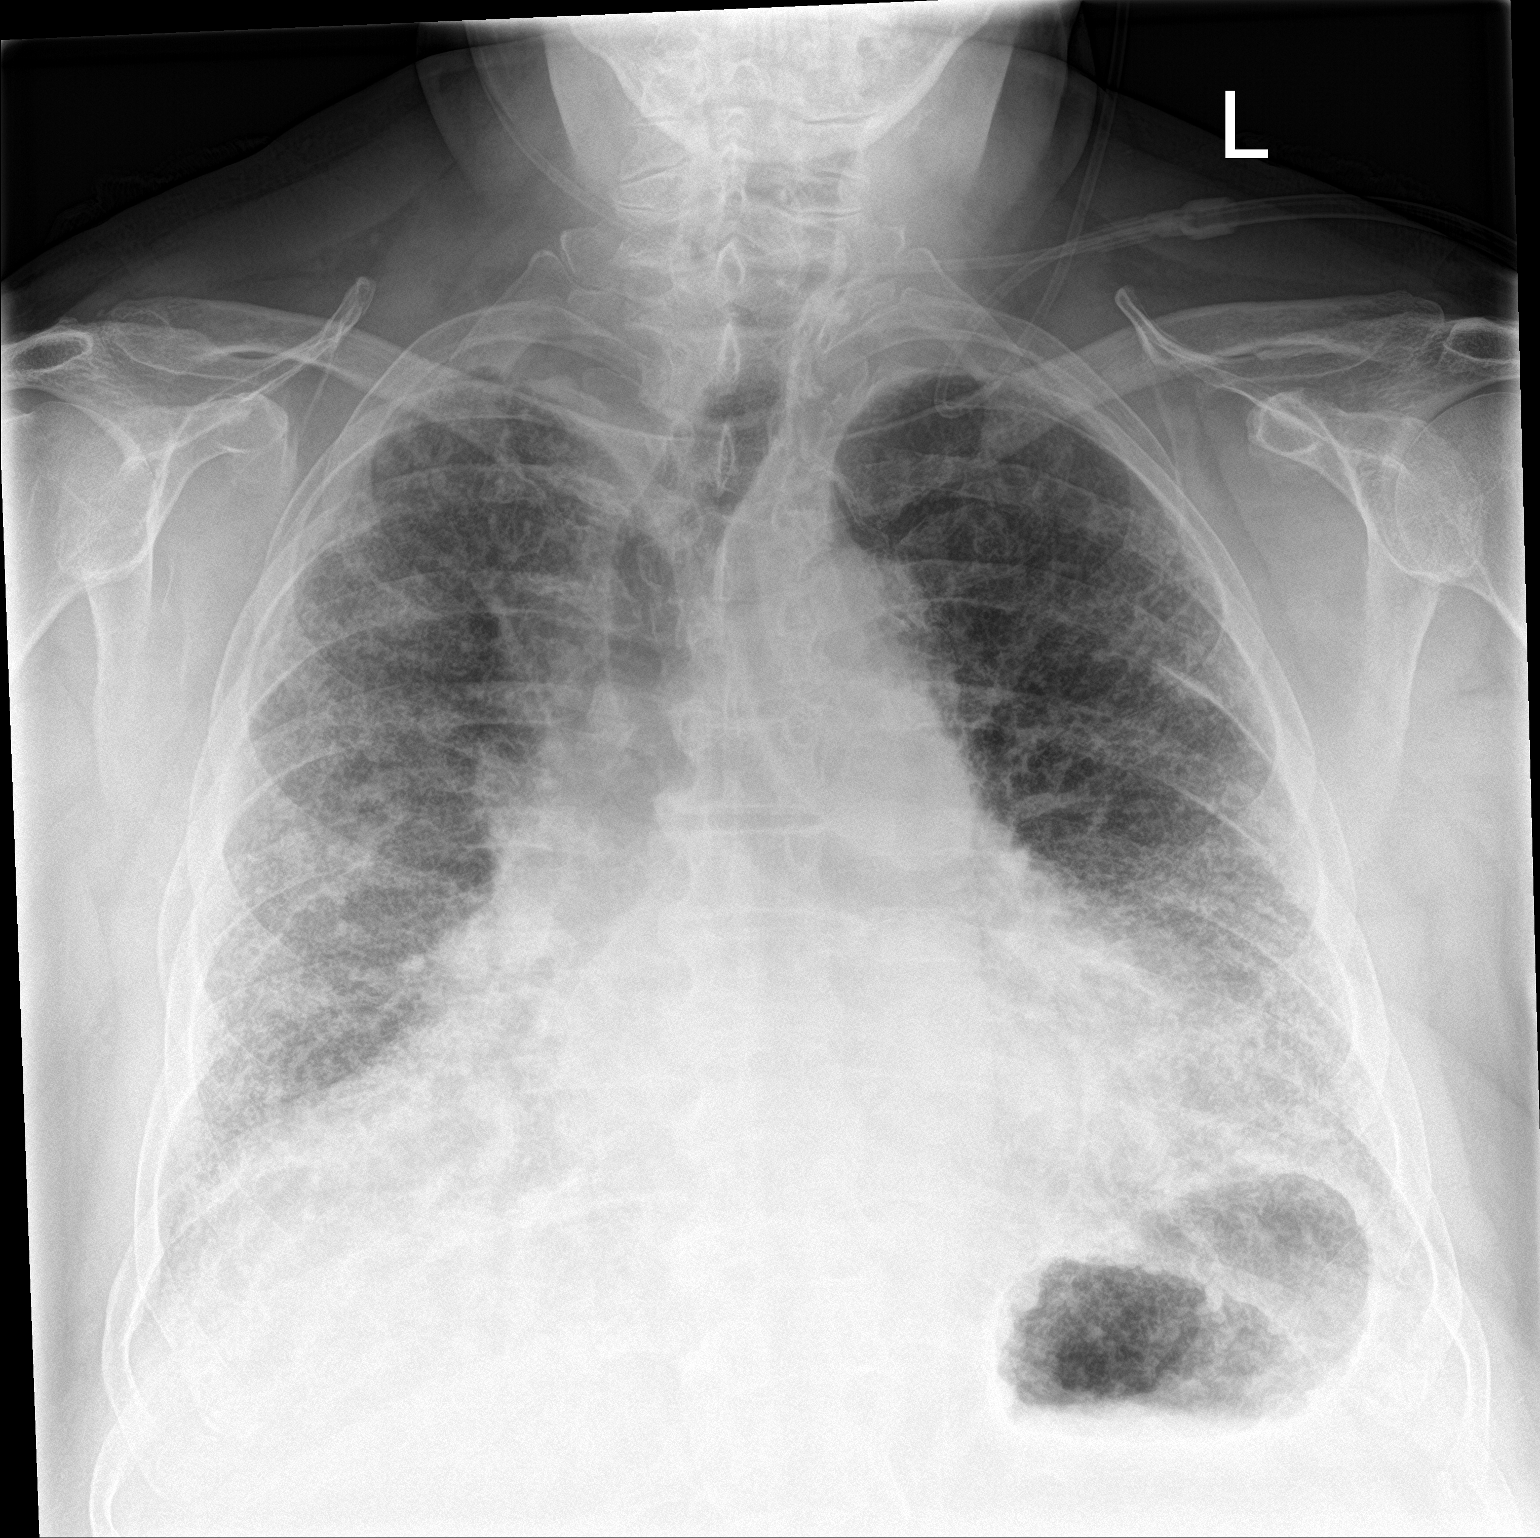

[chest lat]
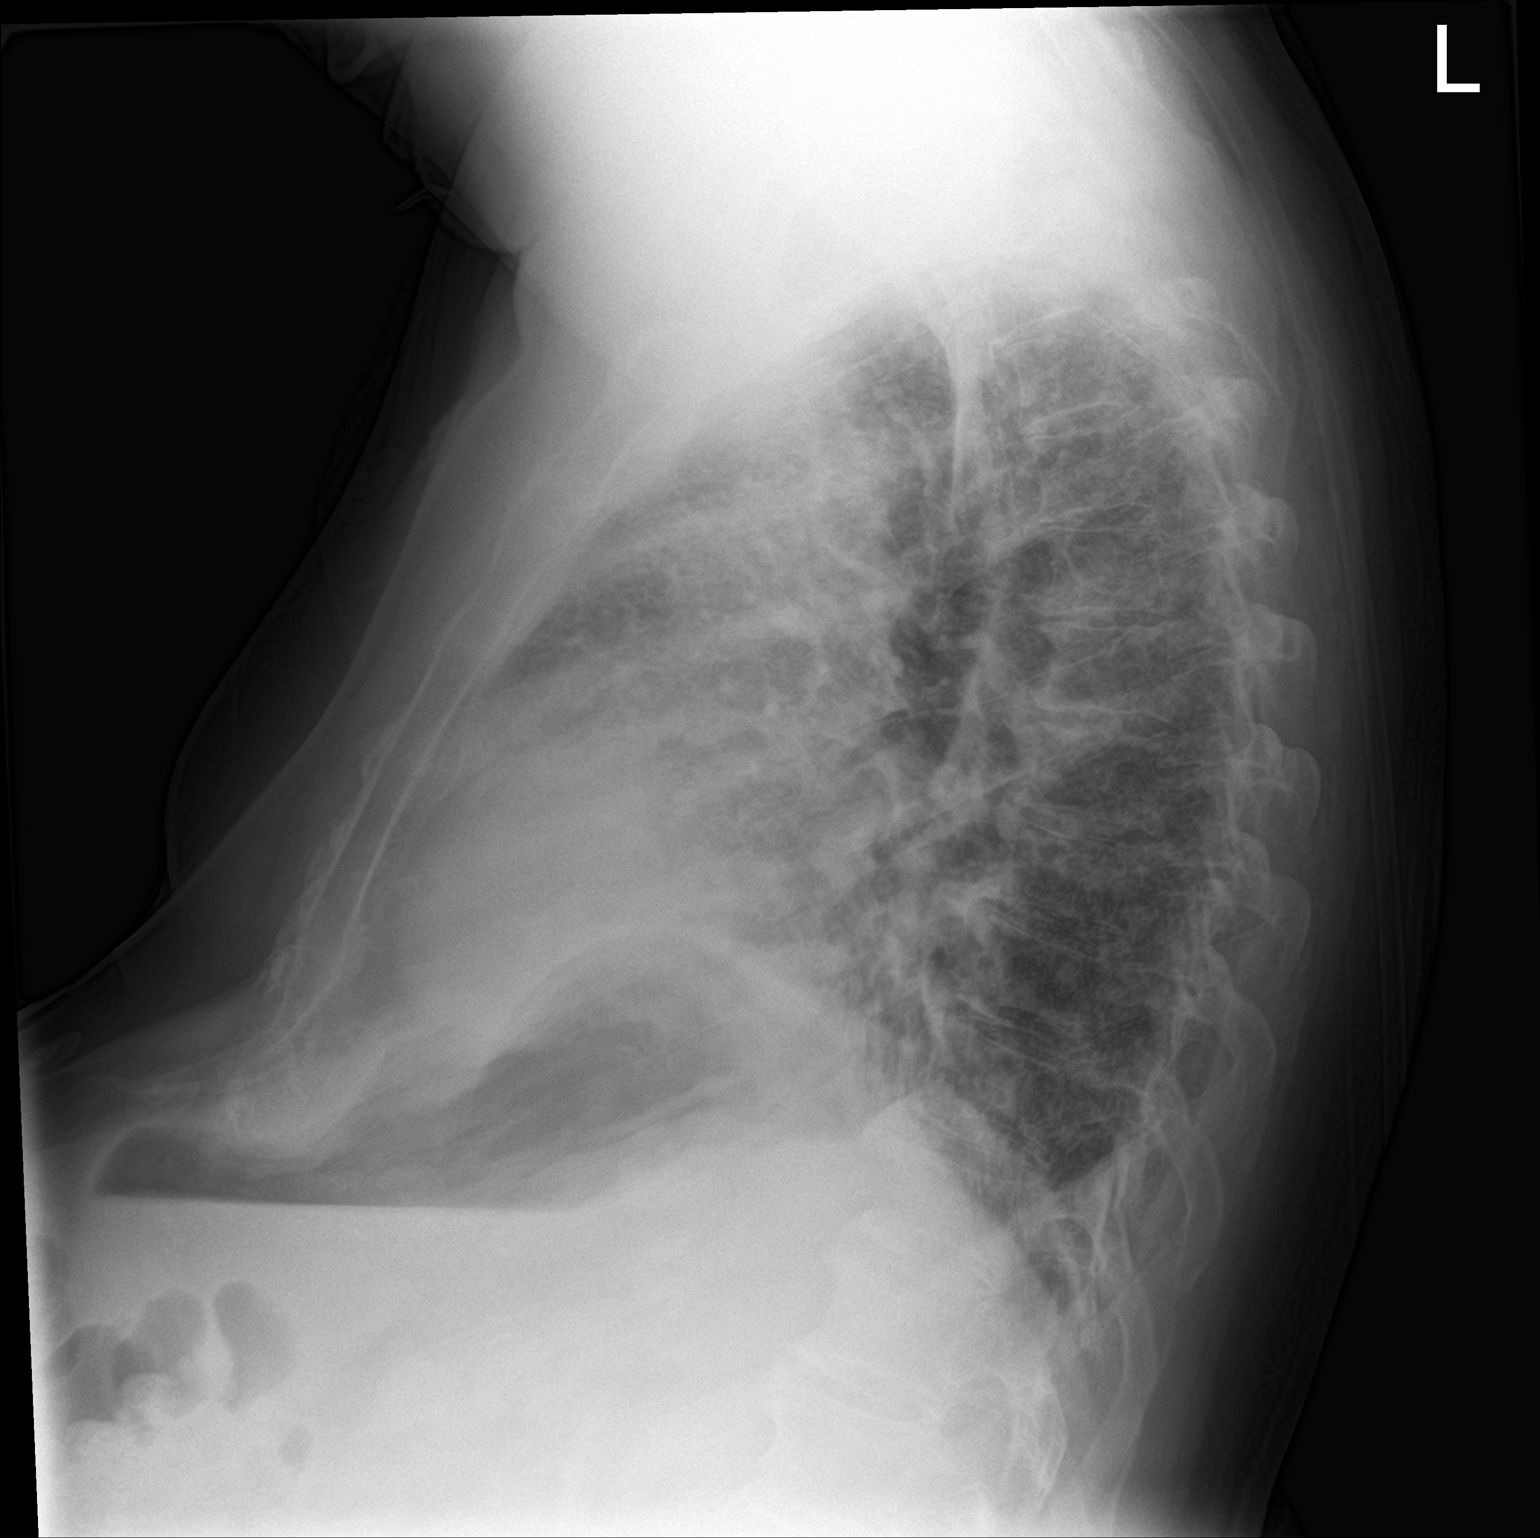

[2 of 2 positions shown; findings below may reference images not displayed]

FINDINGS: Mild cardiomegaly is noted. No pneumothorax is noted. Diffuse
reticular densities are noted throughout both lungs, most
prominently seen in the lung bases, concerning for chronic
interstitial lung disease, although superimposed acute inflammation
or edema cannot be excluded. No significant effusion is noted. Bony
thorax is unremarkable.
IMPRESSION: Diffuse reticular densities are noted throughout both lungs, most
prominently seen in the lung bases, concerning for chronic
interstitial lung disease, although superimposed acute inflammation
or edema cannot be excluded.

## 2021-04-14 IMAGING — CR DG KNEE COMPLETE 4+V*R*
4 series · 4 of 4 positions shown · non-contrast
Comparison: None.

CLINICAL DATA: Fall, right knee pain

EXAM:
RIGHT KNEE - COMPLETE 4+ VIEW

[x knee ap right (1 of 3)]
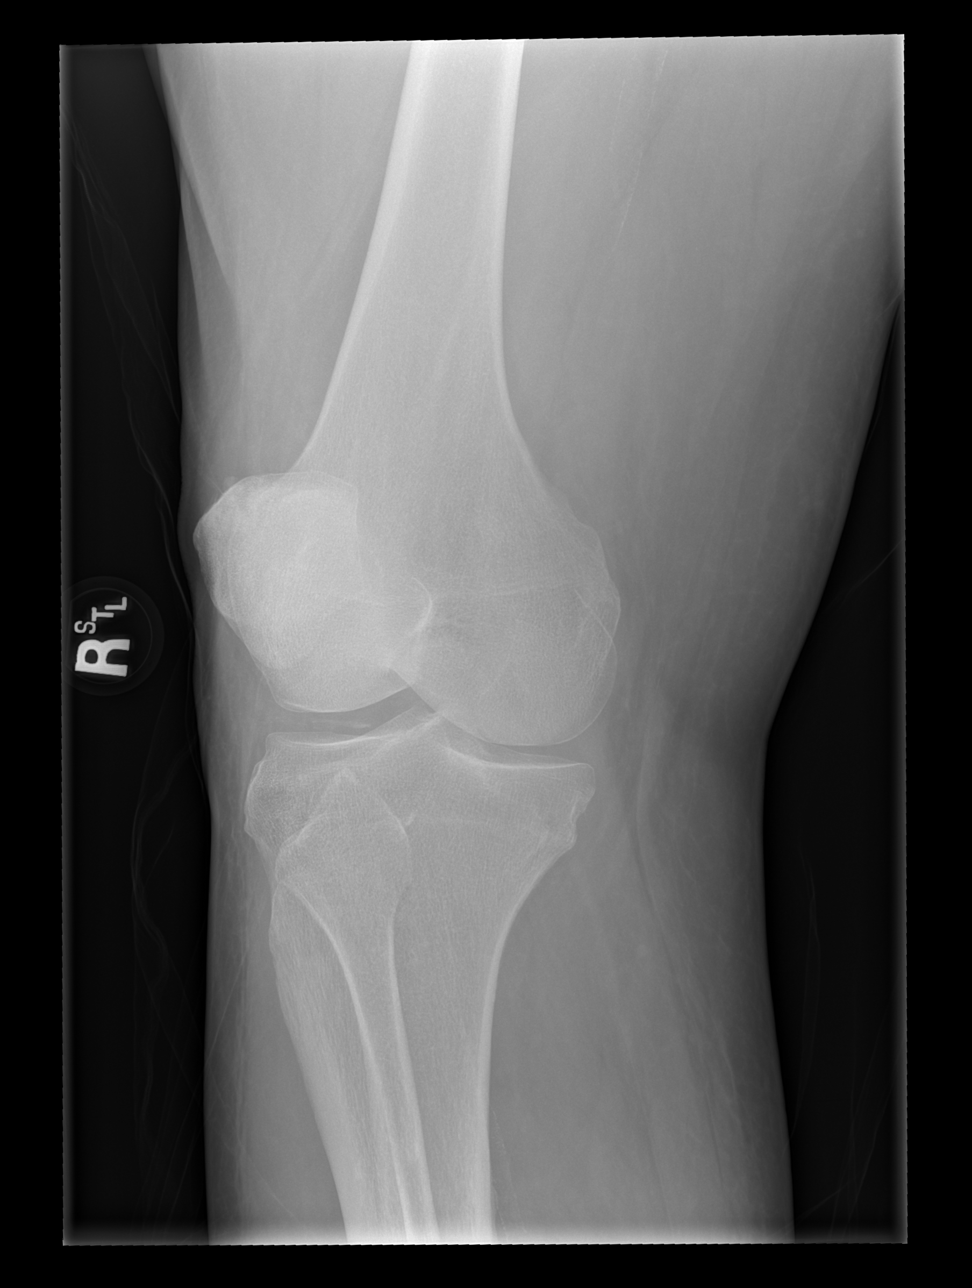

[x knee ap right (2 of 3)]
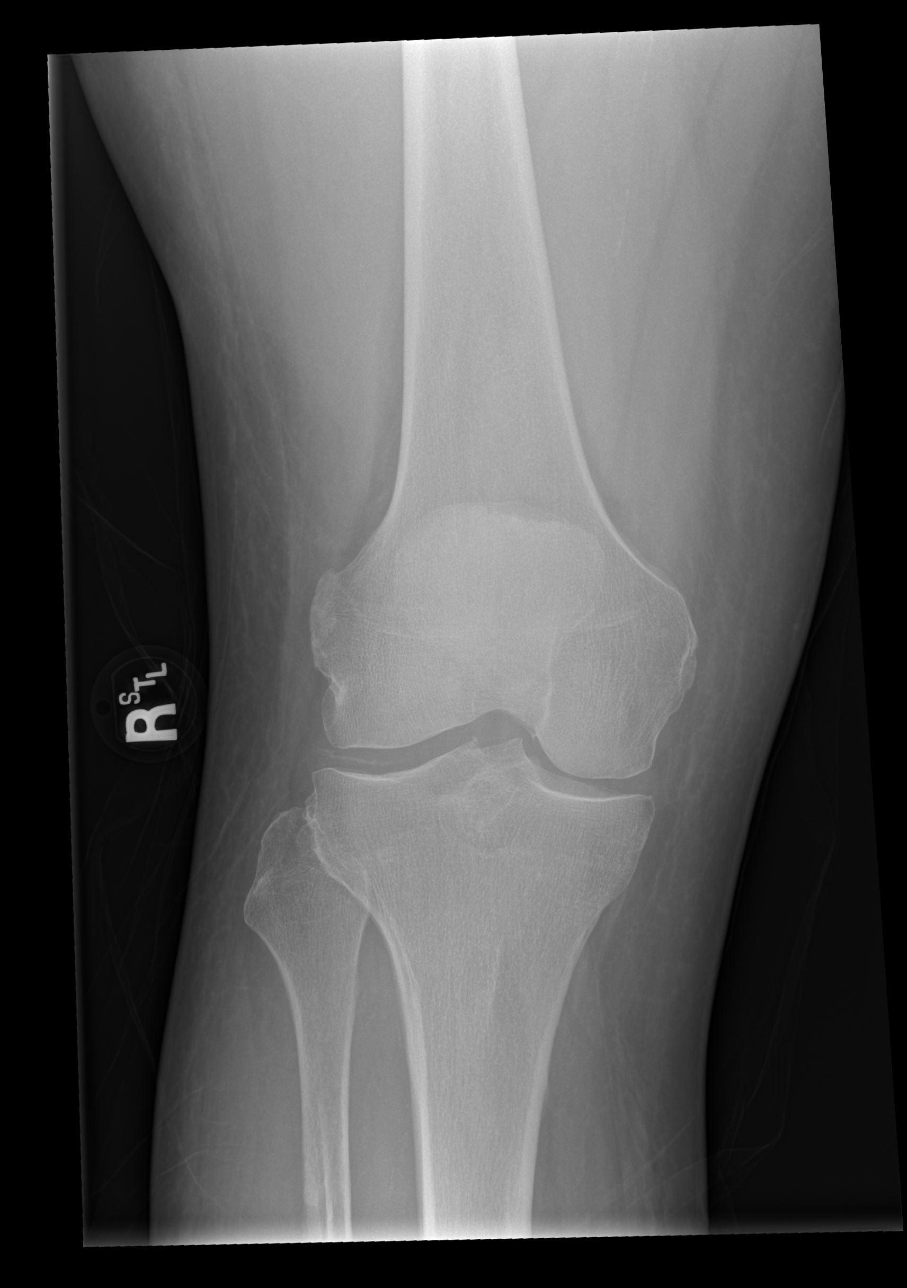

[x knee ap right (3 of 3)]
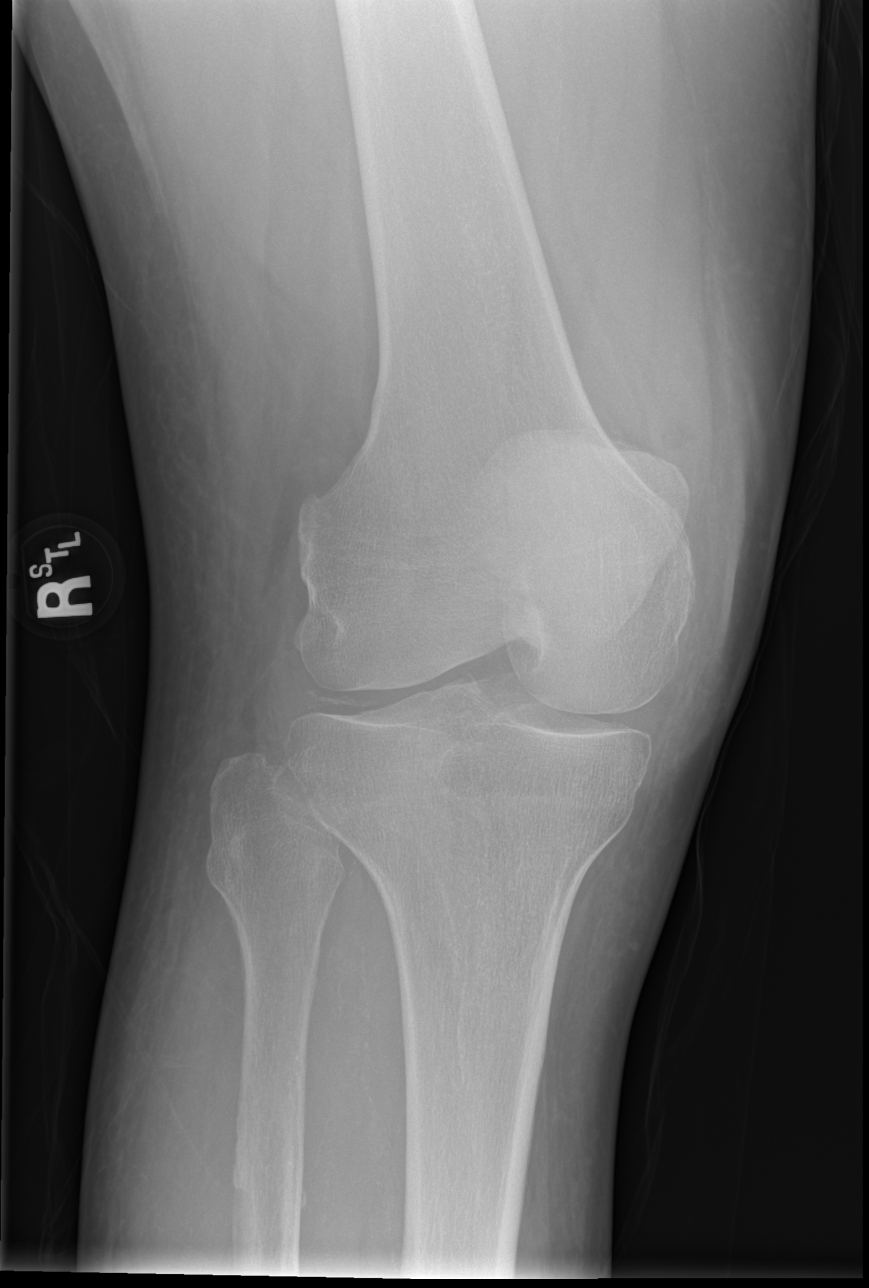

[x knee lat right]
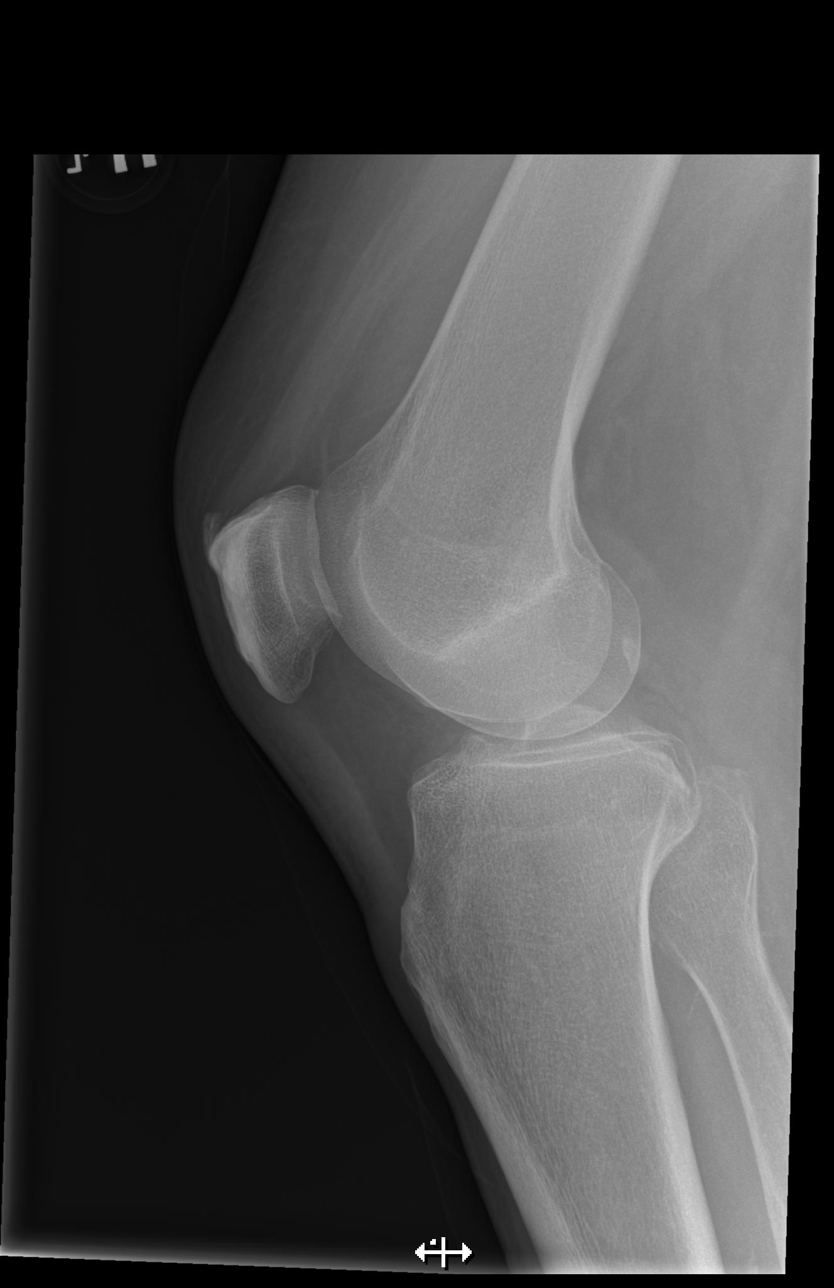

[4 of 4 positions shown; findings below may reference images not displayed]

FINDINGS: Four view radiograph right knee demonstrates normal alignment. No
fracture or dislocation. Joint spaces are preserved. There is
degenerative chondrocalcinosis of the lateral meniscus. No effusion.
Minimal degenerative enthesopathy involving the insertion of the
quadriceps tendon upon the patella.
IMPRESSION: No acute fracture or dislocation.
